# Patient Record
Sex: Male | Born: 1935 | Race: White | Hispanic: No | Marital: Married | Smoking: Never smoker
Health system: Southern US, Community
[De-identification: ages and names within clinical notes are randomized; demographics above are authoritative.]

## PROBLEM LIST (undated history)

## (undated) DIAGNOSIS — I10 Essential (primary) hypertension: Secondary | ICD-10-CM

## (undated) DIAGNOSIS — I639 Cerebral infarction, unspecified: Secondary | ICD-10-CM

## (undated) DIAGNOSIS — E119 Type 2 diabetes mellitus without complications: Secondary | ICD-10-CM

## (undated) DIAGNOSIS — F0390 Unspecified dementia without behavioral disturbance: Secondary | ICD-10-CM

## (undated) HISTORY — PX: EYE SURGERY: SHX253

## (undated) HISTORY — PX: CARDIAC CATHETERIZATION: SHX172

## (undated) HISTORY — DX: Type 2 diabetes mellitus without complications: E11.9

---

## 2003-07-16 ENCOUNTER — Ambulatory Visit (HOSPITAL_COMMUNITY): Admission: RE | Admit: 2003-07-16 | Discharge: 2003-07-17 | Payer: Self-pay | Admitting: Ophthalmology

## 2006-09-02 ENCOUNTER — Encounter: Admission: RE | Admit: 2006-09-02 | Discharge: 2006-09-02 | Payer: Self-pay | Admitting: Internal Medicine

## 2006-12-15 ENCOUNTER — Inpatient Hospital Stay (HOSPITAL_COMMUNITY): Admission: RE | Admit: 2006-12-15 | Discharge: 2006-12-16 | Payer: Self-pay | Admitting: Specialist

## 2007-05-22 ENCOUNTER — Ambulatory Visit: Payer: Self-pay | Admitting: Vascular Surgery

## 2007-06-18 ENCOUNTER — Encounter: Admission: RE | Admit: 2007-06-18 | Discharge: 2007-06-18 | Payer: Self-pay | Admitting: Internal Medicine

## 2007-07-13 ENCOUNTER — Encounter: Admission: RE | Admit: 2007-07-13 | Discharge: 2007-07-14 | Payer: Self-pay | Admitting: Internal Medicine

## 2010-09-29 NOTE — Procedures (Signed)
CAROTID DUPLEX EXAM   INDICATION:  Follow-up evaluation of known carotid artery disease.   HISTORY:  Diabetes:  Diet-controlled.  Cardiac:  No.  Hypertension:  No.  Smoking:  No.  Previous Surgery:  No.  CV History:  Patient had two episodes of dysphasia in November, 2000.  Patient had TIA in March, 2008, characterized by dysphagia.  Previous  study performed June 05, 1999 revealed no ICA stenosis bilaterally.  Amaurosis Fugax No, Paresthesias No, Hemiparesis No                                       RIGHT             LEFT  Brachial systolic pressure:         126               130  Brachial Doppler waveforms:         Triphasic         Triphasic  Vertebral direction of flow:        Antegrade         Antegrade  DUPLEX VELOCITIES (cm/sec)  CCA peak systolic                   69                69  ECA peak systolic                   75                93  ICA peak systolic                   62                69  ICA end diastolic                   16                70  PLAQUE MORPHOLOGY:                  Soft              None  PLAQUE AMOUNT:                      Mild              None  PLAQUE LOCATION:                    Proximal ICA      None   IMPRESSION:  1. 20-39% right internal carotid artery stenosis.  2. No left internal carotid artery stenosis.   ___________________________________________  Janetta Hora Fields, MD   MC/MEDQ  D:  05/22/2007  T:  05/23/2007  Job:  [id number]

## 2010-09-29 NOTE — Op Note (Signed)
NAME:  Clinton [name], Clinton [name]                 ACCOUNT NO.:  [id number]   MEDICAL RECORD NO.:  [id number]          PATIENT TYPE:  INP   LOCATION:  2550                         FACILITY:  [facility]   PHYSICIAN:  Kerrin Champagne, M.D.   DATE OF BIRTH:  1923-07-28   DATE OF PROCEDURE:  12/15/2006  DATE OF DISCHARGE:                               OPERATIVE REPORT   PREOPERATIVE DIAGNOSIS:  Left shoulder full-thickness rotator cuff tear  involving supraspinatus, infraspinatus.  Degenerative joint disease,  severe left acromioclavicular joint.   POSTOPERATIVE DIAGNOSIS:  Left shoulder full-thickness rotator cuff tear  involving supraspinatus, infraspinatus.  Degenerative joint disease,  severe left acromioclavicular joint.   PROCEDURE:  Left shoulder rotator cuff repair using suture to trough  with Mitek anchors and a Grafton jacket.  Acromioplasty left shoulder  and distal clavicle resection.   SURGEON:  Kerrin Champagne, M.D.   ASSISTANT:  Maud Deed, [facility]   ANESTHESIA:  General via oral tracheal intubation, Dr. Michelle Piper.   FINDINGS:  Large rotator cuff tear involving the supraspinatus,  infraspinatus tendons.  Tendons were able to be pulled into a suture and  trough type of repair with two Mitek anchors in a pants over vest type  repair.  Grafton jacket was used to cover any residual small areas of  openings.   SPECIMENS:  None.   ESTIMATED BLOOD LOSS:  75 mL.   COMPLICATIONS:  None.  The patient returned PACU in good condition.   HISTORY OF PRESENT ILLNESS:  The patient is an 77 year old male presents  with a 64-month history of left shoulder weakness associated with sudden  movement of his arm.  He reported slight pop and giving way of his left  arm, difficulty with raising using the arm overhead.  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  After attempts at conservative management and one  or two physical therapy visits, it is apparent that he had severe  rotator cuff weakness.  MRI scan  demonstrated full-thickness rotator  cuff tear involving supraspinatus as well as the infraspinatus tendons  with extensive tendinitis changes.   INTRAOPERATIVE FINDINGS:  The patient was found to have severe  osteoarthritis changes involving the [facility] joint and the distal clavicle  was resected.  Spurs removed.  He then had repair of the rotator cuff  performed with debridement of the frayed edges of the supraspinatus,  infraspinatus, repair the interval between the subscapularis and the  supraspinatus superiorly and anteriorly.  Mitek anchors were used to  additionally repair the supraspinatus to the subscapularis just at the  sulcus between the greater tuberosity and the humeral head cartilage  surface.  The trough was made and supraspinatus and infraspinatus were  pulled into the trough using sutures in the Concept tray.  Acromioplasty  was performed, found to have large spur off the anterior surface  consistent with type II acromion process.   DESCRIPTION OF PROCEDURE:  After adequate general anesthesia the patient  in the black shoulder frame.  All pressure points well-padded.  Standard  prep with DuraPrep solution from the left hand to  left shoulder  circumferentially about the arm axillary area, anterior chest, lower  neck, laterally and posterior scapular.  Draped in the usual manner,  iodine Vi-drape was used.  Infiltration of the skin overlying the  superior aspect of the left AC joint over the anterior one-third  deltoid.  In line with the expected anterior one third raphe.  Incision  over the [facility] joint approximately 1 cm to 2 cm in length skin and  subcutaneous layers directly down over the [facility] joint line with the  clavicle.  Electrocautery used to control bleeders.  Incision made into  the capsule of the [facility] joint and subcutaneous debridement carried out of  large osteophytes over the acromion portion of the [facility] joint.  Cobb  elevator then used to elevate the periosteum  circumferentially about the  distal clavicle and a Senn retractor placed circumferentially about the  distal clavicle.  A oscillating saw then used divide the clavicle 1 cm  from the end.  Smoothing this area shoulder was then adducted and  abducted and showed some persistent impingement anteriorly, so a second  cut was made about three millimeters more centrally or medially.  This  then allowed for decompression of the joint and shoulder adduction  abduction.  The ends of the clavicle then beveled.  And then carefully  irrigated bone wax applied to bleeding cancellous bone surface.  Following irrigation then the capsule was then closed reapproximated  with interrupted zero Vicryl sutures subcu layers with interrupted 2-0  Vicryl suture and skin closed with the subcu stitch of 2-0 Vicryl.  Incision made in the left anterior line with the anterior one third  raphe of the deltoid through skin and subcutaneous layers with 10 blade  scalpel.  Electrocautery used develop a layered interval between the  anterior and middle thirds of the deltoid.  This was easily spread.  Continued up onto the superior surface of the acromion process.  Subperiosteal dissection then carried anterior medial and anterior  lateral exposing the anterior aspect the acromion process and preserving  the periosteal attachment.  Protecting the rotator cuff then with  Copywriter, advertising.  The anterior portion of the  acromion was then osteotomized using oscillating saw removing  approximately 1.5 cm to 2 cm of bone anteriorly, beveling posteriorly.  This was resected using a Theatre manager.  Small bleeder in the  coracoacromial branch was then cauterized medially.  And the  undersurface the acromion process then burred using a pine cone shaped  bur.   The bursa overlying the left shoulder was then immediately identified as  quite thickened and inflamed.  Subdeltoid bursa was resected as was the   subacromial bursa extending posteriorly identifying the torn ends of the  rotator cuff both supraspinatus and infraspinatus retracted posteriorly.  The patient's biceps tendon was identified found to be intact.  All  portion of the frayed attachment of the supraspinatus still present  anteriorly.  The supraspinatus itself was fully torn and retracted, some  small attachments to the subscapularis and subscapularis interval still  remaining.  The infraspinatus tendon had also completely detached  posteriorly.  The degenerated edges and borders of the supraspinatus and  infraspinatus were debrided using sharp dissection and scalpel.  #1  Ethibond sutures then inserted into the ends of the tendon pulling the  superficial and deep torn portions the tendon together with a modified  Kessler type stitch.  A total of three stitches were placed.  Additional  sutures were  then used to repair the interval between the supraspinatus  and subscapularis using interrupted #1 Ethibond sutures.  A three-  quarter inch osteotome then used to make a trough into the border  between the surface of the humeral head and the medial sulcus of the  greater tuberosity.  High-speed bur used to further deepen this.  The  cartilaginous border of this trough was then carefully burred to  bleeding subchondral bone.  Two drill holes were placed and Mitek  anchors with four anchors were then placed.  Next holes were then placed  in the greater tuberosity a total of three for anchoring of the sutures  placed in the distal portions of the supraspinatus, infraspinatus  tendon.  These sutures were then passed using suture passers through the  holes into the trough provided.  The two Mitek anchor sutures were then  passed.  The one more anterior was passed through residual portions of  the subscapularis anteriorly and then through the tendinous portion of  supraspinatus tendon.  This was then tagged.  The second Mitek anchor  was  passed through the portions of the distal supraspinatus tendon  approximately 2 cm proximal to the attachment of the expected attachment  greater tuberosity.  Shoulder was then abducted and the supraspinatus  and infraspinatus tendons were then sutured and tied into place.  This  using the #1 Ethibond sutures and passed through the suture trough  technique.  With this completed then the Mitek anchor sutures were then  tied tightening down the supraspinatus to the bleeding bony surface over  the lateral aspect of the humeral head.  This left a small gap  anteriorly of the supraspinatus and the subscapularis interval.  Grafton  jacket was then reactivated saline for total of 15 minutes.  This was  then cut to the small area of remaining cuff requiring tissue here.  Was  sutured into place using #1 Ethibond sutures running stitch from the  proximal corner extending posteriorly along the edge of the  supraspinatus and then the anterior edge running along the subscapularis  border.  The distal border was then sewn into the greater tuberosity  using #1 Ethibond.  This completed the repair of the patient's rotator  cuff with abduction internal-external rotation, showed no impingement of  the acromion process.  The acromion process itself with bone wax for  preventing further bleeding.  Checking the edges and undersurface there  is no remaining spurs here.  Irrigation was performed and the tendinous  portions of the deltoid then approximated over the acromion process  using #1 Ethibond suture.  The superficial fascial layer of the deltoid  approximated with interrupted zero Vicryl sutures deep subcu layers  approximated with interrupted 2-0 Vicryl sutures and the skin closed  with stainless steel staples.  Adaptic, 4x4s, ABD pad fixed to the skin  with Hypafix tape, ABD pad was placed in the axillary area.  The patient  placed in the shoulder abduction pillow brace.  He was then reactivated,   extubated, returned recovery room in satisfactory condition.  Note that  he had received preoperatively anterior scalene block.      Kerrin Champagne, M.D.  Electronically Signed     JEN/MEDQ  D:  12/15/2006  T:  12/16/2006  Job:  [id number]

## 2011-01-13 ENCOUNTER — Encounter (INDEPENDENT_AMBULATORY_CARE_PROVIDER_SITE_OTHER): Payer: PRIVATE HEALTH INSURANCE | Admitting: Ophthalmology

## 2011-01-13 DIAGNOSIS — H3530 Unspecified macular degeneration: Secondary | ICD-10-CM

## 2011-01-13 DIAGNOSIS — E11319 Type 2 diabetes mellitus with unspecified diabetic retinopathy without macular edema: Secondary | ICD-10-CM

## 2011-01-13 DIAGNOSIS — H43819 Vitreous degeneration, unspecified eye: Secondary | ICD-10-CM

## 2011-01-13 DIAGNOSIS — E11359 Type 2 diabetes mellitus with proliferative diabetic retinopathy without macular edema: Secondary | ICD-10-CM

## 2011-01-22 ENCOUNTER — Other Ambulatory Visit (INDEPENDENT_AMBULATORY_CARE_PROVIDER_SITE_OTHER): Payer: Self-pay | Admitting: Ophthalmology

## 2011-01-22 ENCOUNTER — Encounter (HOSPITAL_COMMUNITY)
Admission: RE | Admit: 2011-01-22 | Discharge: 2011-01-22 | Disposition: A | Discharge status: Home / Self Care | Payer: PRIVATE HEALTH INSURANCE | Source: Ambulatory Visit | Attending: Ophthalmology | Admitting: Ophthalmology

## 2011-01-22 ENCOUNTER — Ambulatory Visit (HOSPITAL_COMMUNITY)
Admission: RE | Admit: 2011-01-22 | Discharge: 2011-01-22 | Disposition: A | Discharge status: Home / Self Care | Payer: PRIVATE HEALTH INSURANCE | Source: Ambulatory Visit | Attending: Ophthalmology | Admitting: Ophthalmology

## 2011-01-22 DIAGNOSIS — H2702 Aphakia, left eye: Secondary | ICD-10-CM

## 2011-01-22 DIAGNOSIS — Z01812 Encounter for preprocedural laboratory examination: Secondary | ICD-10-CM | POA: Insufficient documentation

## 2011-01-22 DIAGNOSIS — H2700 Aphakia, unspecified eye: Secondary | ICD-10-CM | POA: Insufficient documentation

## 2011-01-22 DIAGNOSIS — Z01810 Encounter for preprocedural cardiovascular examination: Secondary | ICD-10-CM | POA: Insufficient documentation

## 2011-01-22 DIAGNOSIS — Z01818 Encounter for other preprocedural examination: Secondary | ICD-10-CM | POA: Insufficient documentation

## 2011-01-22 DIAGNOSIS — M47814 Spondylosis without myelopathy or radiculopathy, thoracic region: Secondary | ICD-10-CM | POA: Insufficient documentation

## 2011-01-22 LAB — CBC
MCH: 30.8 pg (ref 26.0–34.0)
MCV: 90.9 fL (ref 78.0–100.0)
RBC: 4.29 MIL/uL (ref 4.22–5.81)
WBC: 6.5 10*3/uL (ref 4.0–10.5)

## 2011-01-22 LAB — BASIC METABOLIC PANEL
BUN: 23 mg/dL (ref 6–23)
CO2: 31 mEq/L (ref 19–32)
GFR calc Af Amer: >60 mL/min (ref >60)

## 2011-01-22 LAB — SURGICAL PCR SCREEN: MRSA, PCR: NEGATIVE

## 2011-01-28 ENCOUNTER — Ambulatory Visit (HOSPITAL_COMMUNITY)
Admission: RE | Admit: 2011-01-28 | Discharge: 2011-01-28 | Disposition: A | Discharge status: Home / Self Care | Payer: PRIVATE HEALTH INSURANCE | Source: Ambulatory Visit | Attending: Ophthalmology | Admitting: Ophthalmology

## 2011-01-28 DIAGNOSIS — H2700 Aphakia, unspecified eye: Secondary | ICD-10-CM

## 2011-01-28 DIAGNOSIS — Z01818 Encounter for other preprocedural examination: Secondary | ICD-10-CM | POA: Insufficient documentation

## 2011-01-28 DIAGNOSIS — Z01812 Encounter for preprocedural laboratory examination: Secondary | ICD-10-CM | POA: Insufficient documentation

## 2011-01-28 DIAGNOSIS — H43399 Other vitreous opacities, unspecified eye: Secondary | ICD-10-CM | POA: Insufficient documentation

## 2011-01-28 DIAGNOSIS — Z01810 Encounter for preprocedural cardiovascular examination: Secondary | ICD-10-CM | POA: Insufficient documentation

## 2011-01-28 DIAGNOSIS — K219 Gastro-esophageal reflux disease without esophagitis: Secondary | ICD-10-CM | POA: Insufficient documentation

## 2011-01-28 DIAGNOSIS — E1139 Type 2 diabetes mellitus with other diabetic ophthalmic complication: Secondary | ICD-10-CM | POA: Insufficient documentation

## 2011-01-28 DIAGNOSIS — Z8673 Personal history of transient ischemic attack (TIA), and cerebral infarction without residual deficits: Secondary | ICD-10-CM | POA: Insufficient documentation

## 2011-01-28 DIAGNOSIS — E11359 Type 2 diabetes mellitus with proliferative diabetic retinopathy without macular edema: Secondary | ICD-10-CM

## 2011-01-28 LAB — GLUCOSE, CAPILLARY
Glucose-Capillary: 84 mg/dL (ref 70–99)
Glucose-Capillary: 111 mg/dL — ABNORMAL HIGH (ref 70–99)

## 2011-01-29 ENCOUNTER — Inpatient Hospital Stay (INDEPENDENT_AMBULATORY_CARE_PROVIDER_SITE_OTHER): Payer: PRIVATE HEALTH INSURANCE | Admitting: Ophthalmology

## 2011-01-29 DIAGNOSIS — H2700 Aphakia, unspecified eye: Secondary | ICD-10-CM

## 2011-02-02 NOTE — Op Note (Signed)
NAME:  Clinton [name], Clinton [name]                 ACCOUNT NO.:  [id number]  MEDICAL RECORD NO.:  [id number]  LOCATION:  SDSC                         FACILITY:  [facility]  PHYSICIAN:  Beulah Gandy. [name], M.D. DATE OF BIRTH:  08-27-1923  DATE OF PROCEDURE:  01/28/2011 DATE OF DISCHARGE:                              OPERATIVE REPORT   ADMISSION DIAGNOSES:  Proliferative diabetic retinopathy, vitreous opacities, and aphakia of left eye.  PROCEDURES:  Retinal photocoagulation, pars plana vitrectomy, placement of secondary intraocular lens with suture and gas-fluid exchange, all left eye.  SURGEON:  Beulah Gandy. [name], MD  ASSISTANT:  Rosalie Doctor, SA  ANESTHESIA:  General.  DETAILS:  Usual prep and drape.  The indirect ophthalmoscope laser was moved into place, 335 burns were placed around the retinal periphery with a power of 300 milliwatts in a panretinal pattern, size was 1000 milliwatts each and 0.1 seconds each.  Attention was then carried to the limbal area where conjunctival peritomy was performed from 8 o'clock around to 4 o'clock.  Half-thickness scleral flaps were raised at 3 and 9 o'clock in anticipation of IOL suture.  A 3-layered carefully constructed corneoscleral wound was created 7.5 mm in length between 10:30 and 1:30 o'clock.  The wound was begun 2 mm back on the sclera. 25 gauge trocars were placed at 10, 2, and 4 o'clock with infusion at 4 o'clock.  The lighted pick and the cutter were placed at 10 and 2 o'clock respectively.  Pars plana vitrectomy was begun with removal of some vitreous contents and vitreous blood.  Due to previous vitrectomy, there was not a large amount of work to begun in the vitreous cavity. The instruments were removed from the eye and scleral plugs were placed. Two Prolene sutures were passed beneath the scleral flaps posterior to the iris and in the ciliary sulcus from 3 o'clock to 9 o'clock.  The sutures were externalized through the wound and intraocular  lens was brought onto the field made by [employer] model [id number], power 18.0D, length 12.5 mm, optic 7.0 mm, serial number [id number], expiration date 02/2011.  The lens was inspected and cleaned.  The Prolene sutures were attached to the eyelets of the lens.  The lens was passed into the anterior chamber and then into the ciliary sulcus. Sinskey hook was used to manipulate the lens into place.  The lens was dialed into place.  Prolene sutures were drawn securely, knotted, and the free ends were removed.  The scleral flaps were allowed to lie over the Prolene knots.  The attention was carried to the corneoscleral wound.  Five interrupted 10-0 nylon sutures were placed in a radial fashion and inverted.  The wound was tested and found to be tight. Additional vitrectomy was carried out at this point removing pigment and blood from the vitreous cavity and the retinal surface.  Once this was accomplished, a 30% gas-fluid exchange was performed.  The instruments were removed from the eye and the 25-gauge trocars were removed from the eye.  The wounds were tested and found to be tight.  Conjunctiva was reposited with 7-0 chromic suture.  Polymyxin and gentamicin were irrigated into [place] space.  Marcaine was injected around the globe for postop pain.  Closing pressure was less than 10 with a Barraquer tonometer. Complications were none.  Duration was 1-1/2 hours.  Polysporin ophthalmic ointment, patch and shield were placed.  Decadron 10 mg was injected into the lower subconjunctival space.  Patch and shield were placed.  The patient was awakened and taken to recovery in satisfactory condition.     Beulah Gandy. [name], M.D.     JDM/MEDQ  D:  01/28/2011  T:  01/28/2011  Job:  [id number]  Electronically Signed by Alan Mulder M.D. on 02/02/2011 01:48:40 PM

## 2011-02-04 ENCOUNTER — Inpatient Hospital Stay (INDEPENDENT_AMBULATORY_CARE_PROVIDER_SITE_OTHER): Payer: PRIVATE HEALTH INSURANCE | Admitting: Ophthalmology

## 2011-02-04 DIAGNOSIS — H2700 Aphakia, unspecified eye: Secondary | ICD-10-CM

## 2011-02-18 ENCOUNTER — Encounter (INDEPENDENT_AMBULATORY_CARE_PROVIDER_SITE_OTHER): Payer: PRIVATE HEALTH INSURANCE | Admitting: Ophthalmology

## 2011-02-18 DIAGNOSIS — H2700 Aphakia, unspecified eye: Secondary | ICD-10-CM

## 2011-03-01 LAB — TYPE AND SCREEN
ABO/RH(D): O POS
Antibody Screen: NEGATIVE

## 2011-03-01 LAB — CBC
Hemoglobin: 13.6
MCHC: 33.8
Platelets: 186
WBC: 7.5

## 2011-03-01 LAB — BASIC METABOLIC PANEL
BUN: 22
CO2: 30
Chloride: 105

## 2011-04-29 ENCOUNTER — Encounter (INDEPENDENT_AMBULATORY_CARE_PROVIDER_SITE_OTHER): Payer: PRIVATE HEALTH INSURANCE | Admitting: Ophthalmology

## 2011-04-29 DIAGNOSIS — E11359 Type 2 diabetes mellitus with proliferative diabetic retinopathy without macular edema: Secondary | ICD-10-CM

## 2011-04-29 DIAGNOSIS — H43819 Vitreous degeneration, unspecified eye: Secondary | ICD-10-CM

## 2011-04-29 DIAGNOSIS — H3530 Unspecified macular degeneration: Secondary | ICD-10-CM

## 2011-04-29 DIAGNOSIS — E11319 Type 2 diabetes mellitus with unspecified diabetic retinopathy without macular edema: Secondary | ICD-10-CM

## 2011-04-29 DIAGNOSIS — E1139 Type 2 diabetes mellitus with other diabetic ophthalmic complication: Secondary | ICD-10-CM

## 2012-05-01 ENCOUNTER — Ambulatory Visit (INDEPENDENT_AMBULATORY_CARE_PROVIDER_SITE_OTHER): Payer: PRIVATE HEALTH INSURANCE | Admitting: Ophthalmology

## 2012-05-01 DIAGNOSIS — E11319 Type 2 diabetes mellitus with unspecified diabetic retinopathy without macular edema: Secondary | ICD-10-CM

## 2012-05-01 DIAGNOSIS — H353190 Nonexudative age-related macular degeneration, unspecified eye, stage unspecified: Secondary | ICD-10-CM

## 2012-05-01 DIAGNOSIS — E1165 Type 2 diabetes mellitus with hyperglycemia: Secondary | ICD-10-CM

## 2012-05-01 DIAGNOSIS — E1139 Type 2 diabetes mellitus with other diabetic ophthalmic complication: Secondary | ICD-10-CM

## 2012-05-01 DIAGNOSIS — E11359 Type 2 diabetes mellitus with proliferative diabetic retinopathy without macular edema: Secondary | ICD-10-CM

## 2013-05-02 ENCOUNTER — Ambulatory Visit (INDEPENDENT_AMBULATORY_CARE_PROVIDER_SITE_OTHER): Payer: Medicare Other | Admitting: Ophthalmology

## 2013-05-02 DIAGNOSIS — H3530 Unspecified macular degeneration: Secondary | ICD-10-CM

## 2013-05-02 DIAGNOSIS — E1139 Type 2 diabetes mellitus with other diabetic ophthalmic complication: Secondary | ICD-10-CM

## 2013-05-02 DIAGNOSIS — E11319 Type 2 diabetes mellitus with unspecified diabetic retinopathy without macular edema: Secondary | ICD-10-CM

## 2013-05-02 DIAGNOSIS — H43819 Vitreous degeneration, unspecified eye: Secondary | ICD-10-CM

## 2013-05-02 DIAGNOSIS — E11359 Type 2 diabetes mellitus with proliferative diabetic retinopathy without macular edema: Secondary | ICD-10-CM

## 2013-06-20 ENCOUNTER — Ambulatory Visit (INDEPENDENT_AMBULATORY_CARE_PROVIDER_SITE_OTHER): Payer: Medicare Other | Admitting: Podiatrist

## 2013-06-20 ENCOUNTER — Encounter: Payer: Self-pay | Admitting: Podiatrist

## 2013-06-20 VITALS — BP 135/70 | HR 94 | Resp 16

## 2013-06-20 DIAGNOSIS — M79609 Pain in unspecified limb: Secondary | ICD-10-CM

## 2013-06-20 DIAGNOSIS — B351 Tinea unguium: Secondary | ICD-10-CM

## 2013-06-20 NOTE — Patient Instructions (Signed)
Diabetes and Foot Care Diabetes may cause you to have problems because of poor blood supply (circulation) to your feet and legs. This may cause the skin on your feet to become thinner, break easier, and heal more slowly. Your skin may become dry, and the skin may peel and crack. You may also have nerve damage in your legs and feet causing decreased feeling in them. You may not notice minor injuries to your feet that could lead to infections or more serious problems. Taking care of your feet is one of the most important things you can do for yourself.  HOME CARE INSTRUCTIONS  Wear shoes at all times, even in the house. Do not go barefoot. Bare feet are easily injured.  Check your feet daily for blisters, cuts, and redness. If you cannot see the bottom of your feet, use a mirror or ask someone for help.  Wash your feet with warm water (do not use hot water) and mild soap. Then pat your feet and the areas between your toes until they are completely dry. Do not soak your feet as this can dry your skin.  Apply a moisturizing lotion or petroleum jelly (that does not contain alcohol and is unscented) to the skin on your feet and to dry, brittle toenails. Do not apply lotion between your toes.  Trim your toenails straight across. Do not dig under them or around the cuticle. File the edges of your nails with an emery board or nail file.  Do not cut corns or calluses or try to remove them with medicine.  Wear clean socks or stockings every day. Make sure they are not too tight. Do not wear knee-high stockings since they may decrease blood flow to your legs.  Wear shoes that fit properly and have enough cushioning. To break in new shoes, wear them for just a few hours a day. This prevents you from injuring your feet. Always look in your shoes before you put them on to be sure there are no objects inside.  Do not cross your legs. This may decrease the blood flow to your feet.  If you find a minor scrape,  cut, or break in the skin on your feet, keep it and the skin around it clean and dry. These areas may be cleansed with mild soap and water. Do not cleanse the area with peroxide, alcohol, or iodine.  When you remove an adhesive bandage, be sure not to damage the skin around it.  If you have a wound, look at it several times a day to make sure it is healing.  Do not use heating pads or hot water bottles. They may burn your skin. If you have lost feeling in your feet or legs, you may not know it is happening until it is too late.  Make sure your health care provider performs a complete foot exam at least annually or more often if you have foot problems. Report any cuts, sores, or bruises to your health care provider immediately. SEEK MEDICAL CARE IF:   You have an injury that is not healing.  You have cuts or breaks in the skin.  You have an ingrown nail.  You notice redness on your legs or feet.  You feel burning or tingling in your legs or feet.  You have pain or cramps in your legs and feet.  Your legs or feet are numb.  Your feet always feel cold. SEEK IMMEDIATE MEDICAL CARE IF:   There is increasing redness,   swelling, or pain in or around a wound.  There is a red line that goes up your leg.  Pus is coming from a wound.  You develop a fever or as directed by your health care provider.  You notice a bad smell coming from an ulcer or wound. Document Released: 04/30/2000 Document Revised: 01/03/2013 Document Reviewed: 10/10/2012 Exit[employer] Patient Information 2014 [employer].  

## 2013-06-22 NOTE — Progress Notes (Signed)
HPI:  Patient presents today for follow up of foot and nail care. Denies any new complaints today.  Objective:  Patients chart is reviewed.  Vascular status reveals pedal pulses noted at 2 out of 4 dp and pt bilateral .  Neurological sensation is Normal to Semmes Weinstein monofilament bilateral.  Patients nails are thickened, discolored, distrophic, friable and brittle with yellow-Bunning discoloration. Patient subjectively relates they are painful with shoes and with ambulation of bilateral feet.  Assessment:  Symptomatic onychomycosis  Plan:  Discussed treatment options and alternatives.  The symptomatic toenails were debrided through manual an mechanical means without complication.  Return appointment recommended at routine intervals of 3 months    Berenise Hunton, DPM   

## 2013-10-15 ENCOUNTER — Encounter (HOSPITAL_COMMUNITY): Payer: Self-pay | Admitting: Emergency Medicine

## 2013-10-15 ENCOUNTER — Emergency Department (HOSPITAL_COMMUNITY): Payer: Medicare Other

## 2013-10-15 ENCOUNTER — Inpatient Hospital Stay (HOSPITAL_COMMUNITY)
Admission: EM | Admit: 2013-10-15 | Discharge: 2013-10-17 | DRG: 066 | Disposition: A | Discharge status: Home Health | Payer: Medicare Other | Attending: Internal Medicine | Admitting: Internal Medicine

## 2013-10-15 ENCOUNTER — Ambulatory Visit (INDEPENDENT_AMBULATORY_CARE_PROVIDER_SITE_OTHER): Payer: Medicare Other | Admitting: Internal Medicine

## 2013-10-15 VITALS — BP 120/50 | HR 105 | Temp 99.1°F | Resp 16

## 2013-10-15 DIAGNOSIS — I6330 Cerebral infarction due to thrombosis of unspecified cerebral artery: Principal | ICD-10-CM | POA: Diagnosis present

## 2013-10-15 DIAGNOSIS — I69922 Dysarthria following unspecified cerebrovascular disease: Secondary | ICD-10-CM

## 2013-10-15 DIAGNOSIS — I639 Cerebral infarction, unspecified: Secondary | ICD-10-CM | POA: Diagnosis present

## 2013-10-15 DIAGNOSIS — E118 Type 2 diabetes mellitus with unspecified complications: Secondary | ICD-10-CM

## 2013-10-15 DIAGNOSIS — E1165 Type 2 diabetes mellitus with hyperglycemia: Secondary | ICD-10-CM

## 2013-10-15 DIAGNOSIS — IMO0002 Reserved for concepts with insufficient information to code with codable children: Secondary | ICD-10-CM

## 2013-10-15 DIAGNOSIS — I10 Essential (primary) hypertension: Secondary | ICD-10-CM | POA: Diagnosis present

## 2013-10-15 DIAGNOSIS — I6350 Cerebral infarction due to unspecified occlusion or stenosis of unspecified cerebral artery: Secondary | ICD-10-CM

## 2013-10-15 DIAGNOSIS — E119 Type 2 diabetes mellitus without complications: Secondary | ICD-10-CM

## 2013-10-15 DIAGNOSIS — Z7982 Long term (current) use of aspirin: Secondary | ICD-10-CM

## 2013-10-15 DIAGNOSIS — D72829 Elevated white blood cell count, unspecified: Secondary | ICD-10-CM | POA: Diagnosis present

## 2013-10-15 HISTORY — DX: Cerebral infarction, unspecified: I63.9

## 2013-10-15 HISTORY — DX: Essential (primary) hypertension: I10

## 2013-10-15 LAB — URINE MICROSCOPIC-ADD ON

## 2013-10-15 LAB — COMPREHENSIVE METABOLIC PANEL
ALBUMIN: 3.5 g/dL (ref 3.5–5.2)
ALT: 15 U/L (ref 0–53)
AST: 24 U/L (ref 0–37)
Alkaline Phosphatase: 67 U/L (ref 39–117)
BUN: 26 mg/dL — ABNORMAL HIGH (ref 6–23)
CALCIUM: 9.6 mg/dL (ref 8.4–10.5)
CO2: 25 mEq/L (ref 19–32)
CREATININE: 0.84 mg/dL (ref 0.50–1.35)
Chloride: 103 mEq/L (ref 96–112)
GFR calc Af Amer: 89 mL/min — ABNORMAL LOW (ref >90)
GFR calc non Af Amer: 76 mL/min — ABNORMAL LOW (ref >90)
Glucose, Bld: 215 mg/dL — ABNORMAL HIGH (ref 70–99)
POTASSIUM: 4.4 meq/L (ref 3.7–5.3)
Sodium: 140 mEq/L (ref 137–147)
Total Bilirubin: 0.6 mg/dL (ref 0.3–1.2)
Total Protein: 6.4 g/dL (ref 6.0–8.3)

## 2013-10-15 LAB — PROTIME-INR
INR: 1 (ref 0.00–1.49)
Prothrombin Time: 13 seconds (ref 11.6–15.2)

## 2013-10-15 LAB — GLUCOSE, POCT (MANUAL RESULT ENTRY): POC Glucose: 235 mg/dl — AB (ref 70–99)

## 2013-10-15 LAB — I-STAT CHEM 8, ED
BUN: 26 mg/dL — ABNORMAL HIGH (ref 6–23)
CREATININE: 0.9 mg/dL (ref 0.50–1.35)
Calcium, Ion: 1.31 mmol/L — ABNORMAL HIGH (ref 1.13–1.30)
Chloride: 99 mEq/L (ref 96–112)
GLUCOSE: 212 mg/dL — AB (ref 70–99)
HCT: 41 % (ref 39.0–52.0)
HEMOGLOBIN: 13.9 g/dL (ref 13.0–17.0)
Potassium: 4.2 mEq/L (ref 3.7–5.3)
SODIUM: 142 meq/L (ref 137–147)
TCO2: 25 mmol/L (ref 0–100)

## 2013-10-15 LAB — URINALYSIS, ROUTINE W REFLEX MICROSCOPIC
BILIRUBIN URINE: NEGATIVE
Glucose, UA: 500 mg/dL — AB
Ketones, ur: 15 mg/dL — AB
Leukocytes, UA: NEGATIVE
Nitrite: NEGATIVE
PROTEIN: NEGATIVE mg/dL
Specific Gravity, Urine: 1.024 (ref 1.005–1.030)
Urobilinogen, UA: 0.2 mg/dL (ref 0.0–1.0)
pH: 6 (ref 5.0–8.0)

## 2013-10-15 LAB — CBG MONITORING, ED: Glucose-Capillary: 189 mg/dL — ABNORMAL HIGH (ref 70–99)

## 2013-10-15 LAB — CBC
HCT: 37.7 % — ABNORMAL LOW (ref 39.0–52.0)
HEMOGLOBIN: 12.7 g/dL — AB (ref 13.0–17.0)
MCH: 31.4 pg (ref 26.0–34.0)
MCHC: 33.7 g/dL (ref 30.0–36.0)
MCV: 93.3 fL (ref 78.0–100.0)
Platelets: 147 10*3/uL — ABNORMAL LOW (ref 150–400)
RBC: 4.04 MIL/uL — ABNORMAL LOW (ref 4.22–5.81)
RDW: 12.9 % (ref 11.5–15.5)
WBC: 14.2 10*3/uL — AB (ref 4.0–10.5)

## 2013-10-15 LAB — DIFFERENTIAL
Basophils Absolute: 0 10*3/uL (ref 0.0–0.1)
Basophils Relative: 0 % (ref 0–1)
EOS ABS: 0 10*3/uL (ref 0.0–0.7)
Eosinophils Relative: 0 % (ref 0–5)
Lymphocytes Relative: 7 % — ABNORMAL LOW (ref 12–46)
Lymphs Abs: 1.1 10*3/uL (ref 0.7–4.0)
Monocytes Absolute: 0.7 10*3/uL (ref 0.1–1.0)
Monocytes Relative: 5 % (ref 3–12)
NEUTROS PCT: 88 % — AB (ref 43–77)
Neutro Abs: 12.5 10*3/uL — ABNORMAL HIGH (ref 1.7–7.7)

## 2013-10-15 LAB — I-STAT TROPONIN, ED: Troponin i, poc: 0.01 ng/mL (ref 0.00–0.08)

## 2013-10-15 LAB — ETHANOL: Alcohol, Ethyl (B): <11 mg/dL (ref 0–11)

## 2013-10-15 LAB — RAPID URINE DRUG SCREEN, HOSP PERFORMED
AMPHETAMINES: NOT DETECTED
BENZODIAZEPINES: NOT DETECTED
Barbiturates: NOT DETECTED
COCAINE: NOT DETECTED
OPIATES: NOT DETECTED
TETRAHYDROCANNABINOL: NOT DETECTED

## 2013-10-15 LAB — APTT: aPTT: 30 seconds (ref 24–37)

## 2013-10-15 MED ORDER — ASPIRIN 300 MG RE SUPP
300.0000 mg | Freq: Every day | RECTAL | Status: DC
  Administered 2013-10-15 – 2013-10-16 (×2): 300 mg via RECTAL
  Filled 2013-10-15 (×3): qty 1

## 2013-10-15 MED ORDER — ASPIRIN 300 MG RE SUPP: 300.0000 mg | Freq: Every day | RECTAL | Status: DC

## 2013-10-15 MED ORDER — SENNOSIDES-DOCUSATE SODIUM 8.6-50 MG PO TABS: 1.0000 | ORAL_TABLET | Freq: Every evening | ORAL | Status: DC | PRN

## 2013-10-15 MED ORDER — ENOXAPARIN SODIUM 30 MG/0.3ML ~~LOC~~ SOLN
30.0000 mg | SUBCUTANEOUS | Status: DC
  Administered 2013-10-15 – 2013-10-16 (×2): 30 mg via SUBCUTANEOUS
  Filled 2013-10-15 (×3): qty 0.3

## 2013-10-15 MED ORDER — CLOPIDOGREL BISULFATE 75 MG PO TABS
75.0000 mg | ORAL_TABLET | Freq: Every day | ORAL | Status: DC
  Administered 2013-10-16: 75 mg via ORAL
  Filled 2013-10-15: qty 1

## 2013-10-15 MED ORDER — ASPIRIN 81 MG PO TABS: 81.0000 mg | ORAL_TABLET | Freq: Every day | ORAL | Status: DC

## 2013-10-15 MED ORDER — ASPIRIN 81 MG PO CHEW: 81.0000 mg | CHEWABLE_TABLET | Freq: Every day | ORAL | Status: DC

## 2013-10-15 NOTE — ED Provider Notes (Signed)
CSN: [id number]     Arrival date & time 10/15/13  1744 History   First MD Initiated Contact with Patient 10/15/13 1750     Chief Complaint  Patient presents with  . Code Stroke     Patient is a 80 y.o. male with PMH of DM who presents with complaints of changes in speech and dysphagia.  Symptoms began appx 8 hours PTA.  Other symptoms include mild weakness to LUE and LLE.  Patient seen at [facility] prior to arrival and found to have left sided facial droop.  Patient sent to ED as a code stroke.     (Consider location/radiation/quality/duration/timing/severity/associated sxs/prior Treatment) Patient is a 80 y.o. male presenting with general illness. The history is provided by the patient and medical records. No language interpreter was used.  Illness Severity:  Unable to specify Onset quality:  Gradual Progression:  Waxing and waning Chronicity:  New Associated symptoms: no abdominal pain, no chest pain, no diarrhea, no ear pain, no headaches, no loss of consciousness, no shortness of breath, no vomiting and no wheezing     Past Medical History  Diagnosis Date  . Diabetes mellitus without complication    Past Surgical History  Procedure Laterality Date  . Eye surgery     No family history on file. History  Substance Use Topics  . Smoking status: Never Smoker   . Smokeless tobacco: Not on file  . Alcohol Use: Not on file    Review of Systems  HENT: Negative for ear pain.   Respiratory: Negative for shortness of breath and wheezing.   Cardiovascular: Negative for chest pain.  Gastrointestinal: Negative for vomiting, abdominal pain and diarrhea.  Neurological: Negative for loss of consciousness and headaches.  All other systems reviewed and are negative.     Allergies  Review of patient's allergies indicates no known allergies.  Home Medications   Prior to Admission medications   Medication Sig Start Date End Date Taking? Authorizing Provider  aspirin 81 MG tablet Take 81  mg by mouth daily.    Historical Provider, MD  clopidogrel (PLAVIX) 75 MG tablet Take 75 mg by mouth daily with breakfast.    Historical Provider, MD  metFORMIN (GLUCOPHAGE-XR) 500 MG 24 hr tablet Take 500 mg by mouth 2 (two) times daily.    Historical Provider, MD   There were no vitals taken for this visit. Physical Exam  Nursing note and vitals reviewed. Constitutional: He is oriented to person, place, and time. He appears well-developed and well-nourished.  HENT:  Head: Normocephalic and atraumatic.  Right Ear: External ear normal.  Left Ear: External ear normal.  Eyes: Conjunctivae are normal. Pupils are equal, round, and reactive to light.  Neck: Normal range of motion. Neck supple.  Cardiovascular: Normal rate and regular rhythm.   Pulmonary/Chest: Effort normal and breath sounds normal. No respiratory distress.  Abdominal: Soft. Bowel sounds are normal. He exhibits no distension. There is no tenderness. There is no rebound.  Musculoskeletal: Normal range of motion. He exhibits no edema and no tenderness.  Neurological: He is alert and oriented to person, place, and time. He has normal strength and normal reflexes. No cranial nerve deficit or sensory deficit. Coordination (mild difficulty with heel to shin.) abnormal. GCS eye subscore is 4. GCS verbal subscore is 5. GCS motor subscore is 6.  Mile asymmetry noted to left lower side of face made more evident with smile.    Mild dysarthria.  Stroke scale of 4.    Skin:  Skin is warm and dry.  Psychiatric: He has a normal mood and affect.    ED Course  Procedures (including critical care time) Labs Review Labs Reviewed  I-STAT CHEM 8, ED - Abnormal; Notable for the following:    BUN 26 (*)    Glucose, Bld 212 (*)    Calcium, Ion 1.31 (*)    All other components within normal limits  CBG MONITORING, ED - Abnormal; Notable for the following:    Glucose-Capillary 189 (*)    All other components within normal limits  ETHANOL   PROTIME-INR  APTT  CBC  DIFFERENTIAL  COMPREHENSIVE METABOLIC PANEL  URINE RAPID DRUG SCREEN (HOSP PERFORMED)  URINALYSIS, ROUTINE W REFLEX MICROSCOPIC  I-STAT TROPOININ, ED  I-STAT TROPOININ, ED    Imaging Review Ct Head Wo Contrast  10/15/2013   CLINICAL DATA:  Code stroke. Difficult with speech, confusion, and possible left sided weakness.  EXAM: CT HEAD WITHOUT CONTRAST  TECHNIQUE: Contiguous axial images were obtained from the base of the skull through the vertex without contrast.  COMPARISON:  MR brain 06/18/2007.  FINDINGS: Generalized atrophy. Moderate to severe microvascular ischemic change. No evidence for acute infarction, hemorrhage, mass lesion, hydrocephalus, or extra-axial fluid. Calvarium intact. Vascular calcification. No sinus or mastoid disease.  IMPRESSION: No acute intracranial abnormality. Atrophy and small vessel disease.  Critical Value/emergent results were called by telephone at the time of interpretation on 10/15/2013 at 6:05 PM to Dr. Joseph Berkshire , who verbally acknowledged these results.   Electronically Signed   By: Rolla Flatten M.D.   On: 10/15/2013 18:05     EKG Interpretation   Date/Time:  Monday October 15 2013 17:59:58 EDT Ventricular Rate:  87 PR Interval:  167 QRS Duration: 139 QT Interval:  404 QTC Calculation: 486 R Axis:   -77 Text Interpretation:  Sinus rhythm RBBB and LAFB Non-specific ST-t changes  No significant change since last tracing Confirmed by POLLINA  MD,  Frio [phone]) on 10/15/2013 6:07:55 PM      MDM   Final diagnoses:  CVA (cerebral infarction)    Patient presents emergency department as a code stroke. Last reported normal 8 hours prior to arrival. Code stroke protocol followed. Patient was noted to be protecting his own airway and thus airway intervention not thought to be needed. CT scan showed no evidence of acute intracranial abnormality. NIH stroke scale of 4 for dysarthria, left-sided facial droop, and  difficulty with heel to shin. Neurology recommendations include admission for risk stratification to medicine service. Labs, CXR, and urine studies were remarkable only for leukocytosis.  Hospitalist consulted for admission.      Corlis Leak, MD 10/15/13 [phone]

## 2013-10-15 NOTE — ED Notes (Signed)
Stroke team at bedside

## 2013-10-15 NOTE — Patient Instructions (Signed)
Pt taken emergently to the er by ems

## 2013-10-15 NOTE — ED Provider Notes (Signed)
Medical screening exam:  Patient brought in as a code stroke. Patient had onset of difficulty with speech, difficulty swallowing, some confusion and possibly left-sided weakness around 10 AM. She initially presented to urgent care and was sent to the ER by EMS. Upon arrival to the ER he is awake alert and oriented. He does appear to have some difficulty with speech intermittently and possibly blunting of the left nasolabial fold. Screening exam completed, patient stable for CT head and will be brought back to the ER for formal examination.  Orpah Greek, MD 10/15/13 1750

## 2013-10-15 NOTE — Code Documentation (Signed)
Patient with wife this morning when we had worsening slurred speech and difficulty swallowing.  LKW at 1000am, per wife his speech became slurred while he was talking to her.  He went to an urgent care this afternoon, EMS called a code stroke when they responded to the facility.  Head CT done.  NIHSS 4,   Mild facial droop, mild right leg ataxia, dysarthria.  Dr Nicole Kindred at bedside to assess patient.  Plan stroke work up.

## 2013-10-15 NOTE — H&P (Signed)
Hospitalist Admission History and Physical  Patient name: Clinton [name] Medical record number: [id number] Date of birth: Jun 20, 1923 Age: 80 y.o. Gender: male  Primary Care Provider: Horton Finer, MD  Chief Complaint: CVA  History of Present Illness:This is a 80 y.o. year old male with noted prior hx/o CVA with dysarthria deficit, DM, HTN presenting with CVA. Pt states that he started having difficulty swallowing earlier in the day. Has baseline difficulty with swallowing from previous stroke. Became acutely worse. Pt's wife took him to [facility]. There R sided facial droop was noted. Pt was then directed to ER as code stroke. On arrival, code stroke protocol activated. Initial head CT negative for any acute abnormalities. Initial NIH stroke scale of 4. Neurology consulted with working dx of likely recurrent acute subcortical cerebral infarction. Pt on babay ASA and plavix prior to evaluation.  Notable labs: WBC 14.2, Hgb 12.7, Cr 0.84, Glu 215. CXR and UA pending.   Assessment and Plan: Clinton [name] is a 80 y.o. year old male presenting with CVA Active Problems:   Diabetes mellitus   CVA (cerebral infarction)    CVA: Will proceed down stroke protocol including MRI/MRA, carotid dopplers, 2D ECHO, risk stratification labs. Neuro consulted. Appreciate recs. Continue baby ASA and plavix. Continue to follow.   Leukocytosis: ? Reactive in setting of CVA. CXR and UA pending. Hold on abx pending results. Trend WBC.   DM: A1C. SSI  FEN/GI: NPO pending bedside swallow eval.  Prophylaxis: lovenox Disposition: pending furhter evaluation  Code Status:Full Code.    Patient Active Problem List   Diagnosis Date Noted  . Diabetes mellitus 10/15/2013  . CVA (cerebral infarction) 10/15/2013   Past Medical History: Past Medical History  Diagnosis Date  . Diabetes mellitus without complication   . Stroke     Past Surgical History: Past Surgical History  Procedure  Laterality Date  . Eye surgery      Social History: History   Social History  . Marital Status: Married    Spouse Name: N/A    Number of Children: N/A  . Years of Education: N/A   Social History Main Topics  . Smoking status: Never Smoker   . Smokeless tobacco: None  . Alcohol Use: No  . Drug Use: No  . Sexual Activity: None   Other Topics Concern  . None   Social History Narrative  . None    Family History: History reviewed. No pertinent family history.  Allergies: No Known Allergies  Current Facility-Administered Medications  Medication Dose Route Frequency Provider Last Rate Last Dose  . aspirin tablet 81 mg  81 mg Oral Daily Shanda Howells, MD      . Derrill Memo ON 10/16/2013] clopidogrel (PLAVIX) tablet 75 mg  75 mg Oral Q breakfast Shanda Howells, MD      . enoxaparin (LOVENOX) injection 30 mg  30 mg Subcutaneous Q24H Shanda Howells, MD      . senna-docusate (Senokot-S) tablet 1 tablet  1 tablet Oral QHS PRN Shanda Howells, MD       Current Outpatient Prescriptions  Medication Sig Dispense Refill  . aspirin 81 MG tablet Take 81 mg by mouth daily.      . clopidogrel (PLAVIX) 75 MG tablet Take 75 mg by mouth daily with breakfast.      . metFORMIN (GLUCOPHAGE-XR) 500 MG 24 hr tablet Take 500 mg by mouth 2 (two) times daily.       Review Of Systems: 12 point ROS negative except  as noted above in HPI.  Physical Exam: Filed Vitals:   10/15/13 1915  BP: 137/63  Pulse: 63  Temp:   Resp: 25    General: alert, cooperative and dysarthric  HEENT: PERRLA, extra ocular movement intact and mild R sided facial droop Heart: S1, S2 normal, no murmur, rub or gallop, regular rate and rhythm Lungs: clear to auscultation, no wheezes or rales and unlabored breathing Abdomen: abdomen is soft without significant tenderness, masses, organomegaly or guarding Extremities: mild RUE weakness Skin:no rashes, no ecchymoses Neurology: R sided facial droop and mild RUE weakness, otherwise  normal neuro exam  Labs and Imaging: Lab Results  Component Value Date/Time   NA 142 10/15/2013  6:00 PM   K 4.2 10/15/2013  6:00 PM   CL 99 10/15/2013  6:00 PM   CO2 25 10/15/2013  5:47 PM   BUN 26* 10/15/2013  6:00 PM   CREATININE 0.90 10/15/2013  6:00 PM   GLUCOSE 212* 10/15/2013  6:00 PM   Lab Results  Component Value Date   WBC 14.2* 10/15/2013   HGB 13.9 10/15/2013   HCT 41.0 10/15/2013   MCV 93.3 10/15/2013   PLT 147* 10/15/2013    Ct Head Wo Contrast  10/15/2013   CLINICAL DATA:  Code stroke. Difficult with speech, confusion, and possible left sided weakness.  EXAM: CT HEAD WITHOUT CONTRAST  TECHNIQUE: Contiguous axial images were obtained from the base of the skull through the vertex without contrast.  COMPARISON:  MR brain 06/18/2007.  FINDINGS: Generalized atrophy. Moderate to severe microvascular ischemic change. No evidence for acute infarction, hemorrhage, mass lesion, hydrocephalus, or extra-axial fluid. Calvarium intact. Vascular calcification. No sinus or mastoid disease.  IMPRESSION: No acute intracranial abnormality. Atrophy and small vessel disease.  Critical Value/emergent results were called by telephone at the time of interpretation on 10/15/2013 at 6:05 PM to Dr. Joseph Berkshire , who verbally acknowledged these results.   Electronically Signed   By: Rolla Flatten M.D.   On: 10/15/2013 18:05           Shanda Howells MD  Pager: [phone]

## 2013-10-15 NOTE — Progress Notes (Signed)
   Subjective:    Patient ID: LORCAN SHELP, male    DOB: 08-22-1923, 80 y.o.   MRN: [id number]  HPI  Cc: difficulty with speeh and finding words and Difficulty swallowing  HPI: 80 year old man comes in with his wife with cc of acute onset of difficulty with speech and swallowing since 10 am this am. Wife urging him to come to er but pt refused and agreed to urgicare this eve. Similar symptoms when he had a stroke 3 years ago. Takes plavix and asa, no head trauma, was fine last night and when he was up this am until acute symptoms onset at 10 am. No cp no sob no difficulty with ambulation gait or muscle strength. No confusion. Compliant with meds. Symptoms moderate in severity and constant since 10 am.   Review of Systems  Constitutional: Positive for fatigue.  HENT: Negative for drooling.   Eyes: Negative for visual disturbance.  Respiratory: Negative for chest tightness.   Cardiovascular: Negative for chest pain and palpitations.  Neurological: Positive for speech difficulty. Negative for weakness.  All other systems reviewed and are negative.      Objective:   Physical Exam  Nursing note and vitals reviewed. Constitutional: He is oriented to person, place, and time. He appears well-developed and well-nourished.  HENT:  Head: Normocephalic and atraumatic.  Right Ear: External ear normal.  Left Ear: External ear normal.  Nose: Nose normal.  Mouth/Throat: Oropharynx is clear and moist.  Left facial drop and left ptosis  Eyes: Conjunctivae and EOM are normal. Pupils are equal, round, and reactive to light.  Neck: Normal range of motion. Neck supple.  Cardiovascular: Normal rate, regular rhythm, normal heart sounds and intact distal pulses.   Pulmonary/Chest: Effort normal and breath sounds normal.  Abdominal: Soft. Bowel sounds are normal.  Musculoskeletal: Normal range of motion.  Neurological: He is alert and oriented to person, place, and time. He has normal reflexes. He  displays normal reflexes. No cranial nerve deficit. He exhibits normal muscle tone. Coordination normal.  Muscle strength equal bilat  Skin: Skin is warm and dry.  echhymosis on extremities  Psychiatric: He has a normal mood and affect. His behavior is normal. Judgment and thought content normal.   accuchek 235 ekg   Results for orders placed in visit on 10/15/13  GLUCOSE, POCT (MANUAL RESULT ENTRY)      Result Value Ref Range   POC Glucose 235 (*) 70 - 99 mg/dl   EKG: nsr hr of 91 rbbb lahb bifasicular block nssttchanges intervals wnl     Assessment & Plan:  Patient with symptoms of acute cva ems notified to emergently transport pt to the er for further evaluation and treatment.

## 2013-10-15 NOTE — Consult Note (Signed)
Referring Physician: Dr. Betsey Holiday    Chief Complaint: new-onset speech and swallowing difficulty.  HPI: Clinton [name] is an 80 y.o. male with a history of diabetes mellitus and small stroke 3 years ago presenting with new onset speech and swallowing difficulty starting at 10 AM today. Patient has been taking aspirin and Plavix daily. CT scan of his head showed no acute intracranial abnormality. Atrophy and small vessel disease were noted. NIH stroke score was 4.  LSN: 10 AM on 10/15/2013 tPA Given: No: beyond time window for treatment consideration; mild deficits only. MRankin: 1  Past Medical History  Diagnosis Date  . Diabetes mellitus without complication   . Stroke     History reviewed. No pertinent family history.   Medications: I have reviewed the patient's current medications.  ROS: History obtained from spouse and the patient  General ROS: negative for - chills, fatigue, fever, night sweats, weight gain or weight loss Psychological ROS: negative for - behavioral disorder, hallucinations, memory difficulties, mood swings or suicidal ideation Ophthalmic ROS: negative for - blurry vision, double vision, eye pain or loss of vision ENT ROS: negative for - epistaxis, nasal discharge, oral lesions, sore throat, tinnitus or vertigo Allergy and Immunology ROS: negative for - hives or itchy/watery eyes Hematological and Lymphatic ROS: negative for - bleeding problems, bruising or swollen lymph nodes Endocrine ROS: negative for - galactorrhea, hair pattern changes, polydipsia/polyuria or temperature intolerance Respiratory ROS: negative for - cough, hemoptysis, shortness of breath or wheezing Cardiovascular ROS: negative for - chest pain, dyspnea on exertion, edema or irregular heartbeat Gastrointestinal ROS: negative for - abdominal pain, diarrhea, hematemesis, nausea/vomiting or stool incontinence Genito-Urinary ROS: negative for - dysuria, hematuria, incontinence or urinary  frequency/urgency Musculoskeletal ROS: negative for - joint swelling or muscular weakness Neurological ROS: as noted in HPI Dermatological ROS: negative for rash and skin lesion changes  Physical Examination: Blood pressure 145/64, pulse 67, temperature 98.3 F (36.8 C), temperature source Tympanic, resp. rate 16, SpO2 98.00%.  Neurologic Examination: Mental Status: Alert, oriented, thought content appropriate.  Speech moderately slurred without evidence of aphasia. Able to follow commands without difficulty. Cranial Nerves: II-Visual fields were normal. III/IV/VI-Pupils were equal and reacted. Extraocular movements were full and conjugate.    V/VII-no facial numbness; mild left lower facial weakness. VIII-normal. X-moderate dysarthria. XII-midline tongue extension Motor: 5/5 bilaterally with normal tone and bulk Sensory: Normal throughout. Deep Tendon Reflexes: 1+ and symmetric. Plantars: mute bilaterally Cerebellar: Normal finger-to-nose testing; mild coordination difficulty heel-to-shin testing with left lower extremity.  Ct Head Wo Contrast  10/15/2013   CLINICAL DATA:  Code stroke. Difficult with speech, confusion, and possible left sided weakness.  EXAM: CT HEAD WITHOUT CONTRAST  TECHNIQUE: Contiguous axial images were obtained from the base of the skull through the vertex without contrast.  COMPARISON:  MR brain 06/18/2007.  FINDINGS: Generalized atrophy. Moderate to severe microvascular ischemic change. No evidence for acute infarction, hemorrhage, mass lesion, hydrocephalus, or extra-axial fluid. Calvarium intact. Vascular calcification. No sinus or mastoid disease.  IMPRESSION: No acute intracranial abnormality. Atrophy and small vessel disease.  Critical Value/emergent results were called by telephone at the time of interpretation on 10/15/2013 at 6:05 PM to Dr. Joseph Berkshire , who verbally acknowledged these results.   Electronically Signed   By: Rolla Flatten M.D.   On:  10/15/2013 18:05    Assessment: 80 y.o. male with a history of diabetes mellitus and previous cerebral infarction, presenting with likely acute recurrent subcortical cerebral infarction.  Stroke Risk  Factors - diabetes mellitus  Plan: 1. HgbA1c, fasting lipid panel 2. MRI, MRA  of the brain without contrast 3. PT consult, OT consult, Speech consult 4. Echocardiogram 5. Carotid dopplers 6. Prophylactic therapy-continue aspirin 81 mg per day and Plavix 75 mg per day ( or aspirin 300 mg suppository if unable to swallow safely) 7. Risk factor modification 8. Telemetry monitoring   C.R. Nicole Kindred, MD [facility] [phone]  10/15/2013, 6:23 PM

## 2013-10-15 NOTE — ED Notes (Signed)
Pt to ED from [facility] for L sided facial droop, difficulty speaking, and difficulty swallowing. LSW per EMS 10am. Has 3 year hx of difficulty swallowing

## 2013-10-15 NOTE — ED Provider Notes (Signed)
Patient presented to the ER with possible stroke. Patient brought to the ER by EMS as a code stroke. Patient had onset of symptoms at 10 AM, however. Patient with left-sided weakness and difficulty with speech.  Face to face Exam: HEENT - PERRLA Lungs - CTAB Heart - RRR, no M/R/G Abd - S/NT/ND Neuro - alert, oriented x3. Blunting of both nasolabial folds. Difficulty with speech intermittently, possible expressive aphasia  Plan: Evaluated as a code stroke in conjunction with neurology. Workup for stroke initiated. Initial head CT negative. Patient will require admission for further workup.   Orpah Greek, MD 10/15/13 [phone]

## 2013-10-15 NOTE — ED Notes (Signed)
All personal belongings sent with pt.

## 2013-10-16 ENCOUNTER — Encounter (HOSPITAL_COMMUNITY): Payer: Self-pay | Admitting: *Deleted

## 2013-10-16 ENCOUNTER — Inpatient Hospital Stay (HOSPITAL_COMMUNITY): Payer: Medicare Other

## 2013-10-16 DIAGNOSIS — E119 Type 2 diabetes mellitus without complications: Secondary | ICD-10-CM

## 2013-10-16 DIAGNOSIS — G459 Transient cerebral ischemic attack, unspecified: Secondary | ICD-10-CM

## 2013-10-16 DIAGNOSIS — D72829 Elevated white blood cell count, unspecified: Secondary | ICD-10-CM

## 2013-10-16 LAB — HEMOGLOBIN A1C
Hgb A1c MFr Bld: 6 % — ABNORMAL HIGH (ref <5.7)
Mean Plasma Glucose: 126 mg/dL — ABNORMAL HIGH (ref <117)

## 2013-10-16 LAB — GLUCOSE, CAPILLARY
GLUCOSE-CAPILLARY: 102 mg/dL — AB (ref 70–99)
Glucose-Capillary: 117 mg/dL — ABNORMAL HIGH (ref 70–99)
Glucose-Capillary: 118 mg/dL — ABNORMAL HIGH (ref 70–99)
Glucose-Capillary: 144 mg/dL — ABNORMAL HIGH (ref 70–99)
Glucose-Capillary: 125 mg/dL — ABNORMAL HIGH (ref 70–99)

## 2013-10-16 LAB — LIPID PANEL
CHOL/HDL RATIO: 2 ratio
CHOLESTEROL: 146 mg/dL (ref 0–200)
HDL: 73 mg/dL (ref >39)
LDL Cholesterol: 61 mg/dL (ref 0–99)
Triglycerides: 62 mg/dL (ref <150)
VLDL: 12 mg/dL (ref 0–40)

## 2013-10-16 MED ORDER — LIVING WELL WITH DIABETES BOOK
Freq: Once | Status: AC
  Administered 2013-10-16: 02:00:00
  Filled 2013-10-16: qty 1

## 2013-10-16 MED ORDER — STROKE: EARLY STAGES OF RECOVERY BOOK
Freq: Once | Status: AC
  Administered 2013-10-16: 02:00:00
  Filled 2013-10-16: qty 1

## 2013-10-16 MED ORDER — BIOTENE DRY MOUTH MT LIQD
15.0000 mL | Freq: Two times a day (BID) | OROMUCOSAL | Status: DC
  Administered 2013-10-17: 15 mL via OROMUCOSAL

## 2013-10-16 NOTE — Progress Notes (Signed)
  Echocardiogram 2D Echocardiogram has been performed.  Clinton [name] 10/16/2013, 10:04 AM

## 2013-10-16 NOTE — Progress Notes (Signed)
INITIAL NUTRITION ASSESSMENT  DOCUMENTATION CODES Per approved criteria  -Not Applicable   INTERVENTION: Diet advancement per MD Provide Glucerna Shake BID when diet advanced (pt prefers chocolate)  NUTRITION DIAGNOSIS: Predicted suboptimal energy intake related to past and present stroke as evidenced by history of weight loss and current NPO status.    Goal: Pt to meet >/= 90% of their estimated nutrition needs   Monitor:  Diet advancement, PO intake, weight trend, lasb  Reason for Assessment: Malnutrition Screening Tool, score of 2  80 y.o. male  Admitting Dx: CVA (cerebral infarction)  ASSESSMENT: 80 y.o. year old male with noted prior hx/o CVA with dysarthria deficit, DM, HTN presenting with CVA. Pt states that he started having difficulty swallowing earlier in the day. Has baseline difficulty with swallowing from previous stroke. Became acutely worse.  Pt reports that before his last stroke 3-4 years ago he weighed 150-155 lbs. He reports gradual weight loss since due to muscle atrophy and decreased appetite. Based on pt's report he has lost 21-24% of his body weight in the past 3-4 years. He states his appetite has been pretty good the past few weeks PTA and he has been eating well.  Pt agreeable to trying Glucerna Shakes. Encouraged pt to continue intake of one shake daily after discharge to help promote weight maintenance.   Nutrition Focused Physical Exam:  Subcutaneous Fat:  Orbital Region: WNL Upper Arm Region: mild wasting Thoracic and Lumbar Region: NA  Muscle:  Temple Region: moderate wasting Clavicle Bone Region: moderate wasting Clavicle and Acromion Bone Region: mild wasting Scapular Bone Region: NA Dorsal Hand: mild wasting Patellar Region: severe wasting Anterior Thigh Region: moderate/severe wasting Posterior Calf Region: mild wasting  Edema: none noted   Height: Ht Readings from Last 1 Encounters:  10/16/13 5\' 6"  (1.676 m)    Weight: Wt  Readings from Last 1 Encounters:  10/16/13 119 lb (53.978 kg)    Ideal Body Weight: 142 lbs  % Ideal Body Weight: 84%  Wt Readings from Last 10 Encounters:  10/16/13 119 lb (53.978 kg)    Usual Body Weight: 150 lbs (3-4 years ago)  % Usual Body Weight: 79%  BMI:  Body mass index is 19.22 kg/(m^2).  Estimated Nutritional Needs: Kcal: 1600-1750 Protein: 70-80 grams Fluid: 1.6 L/day  Skin: WDL  Diet Order: NPO  EDUCATION NEEDS: -No education needs identified at this time  No intake or output data in the 24 hours ending 10/16/13 1045  Last BM: 5/31   Labs:   Recent Labs Lab 10/15/13 1747 10/15/13 1800  NA 140 142  K 4.4 4.2  CL 103 99  CO2 25  --   BUN 26* 26*  CREATININE 0.84 0.90  CALCIUM 9.6  --   GLUCOSE 215* 212*    CBG (last 3)   Recent Labs  10/15/13 1808 10/16/13 0421 10/16/13 0813  GLUCAP 189* 117* 118*    Scheduled Meds: . aspirin  300 mg Rectal Daily  . clopidogrel  75 mg Oral Q breakfast  . enoxaparin (LOVENOX) injection  30 mg Subcutaneous Q24H    Continuous Infusions:   Past Medical History  Diagnosis Date  . Diabetes mellitus without complication   . Stroke   . Hypertension     Past Surgical History  Procedure Laterality Date  . Eye surgery    . Cardiac catheterization      Pryor Ochoa RD, LDN Inpatient Clinical Dietitian Pager: [phone] After Hours Pager: [phone]

## 2013-10-16 NOTE — Progress Notes (Signed)
Report received from off-going RN, patient stable. In the chair with wife at the bedside.

## 2013-10-16 NOTE — ED Provider Notes (Signed)
I saw and evaluated the patient, reviewed the resident's note and I agree with the findings and plan.   EKG Interpretation   Date/Time:  Monday October 15 2013 17:59:58 EDT Ventricular Rate:  87 PR Interval:  167 QRS Duration: 139 QT Interval:  404 QTC Calculation: 486 R Axis:   -77 Text Interpretation:  Sinus rhythm RBBB and LAFB Non-specific ST-t changes  No significant change since last tracing Confirmed by Alrick Cubbage  MD,  Michoel Kunin [phone]) on 10/15/2013 6:07:55 PM      Patient brought to the department as a code stroke. Patient presented far out of the window for intervention, however. Examination reveals mild confusion and slight uncoordination, no focal findings. Evaluated in conjunction with neurology. Neurology recommendation for admission. Patient admitted to internal medicine.  Orpah Greek, MD 10/16/13 [phone]

## 2013-10-16 NOTE — Progress Notes (Signed)
OT Cancellation Note  Patient Details Name: Clinton [name] MRN: [id number] DOB: 29-May-1923   Cancelled Treatment:    Reason Eval/Treat Not Completed: Patient at procedure or test/ unavailable OT to reattempt.  Hortencia Pilar 10/16/2013, 9:40 AM

## 2013-10-16 NOTE — Evaluation (Signed)
Occupational Therapy Evaluation Patient Details Name: Clinton [name] MRN: [id number] DOB: 01-Feb-1924 Today's Date: 10/16/2013    History of Present Illness This is a 80 y.o. year old male with noted prior hx/o CVA with dysarthria deficit, DM, HTN presenting with CVA. Pt states that he started having difficulty swallowing earlier in the day. Has baseline difficulty with swallowing from previous stroke. Became acutely worse. Pt's wife took him to [facility]. There R sided facial droop was noted. Pt was then directed to ER as code stroke. MRI confirmed acute pontine infarcts.   Clinical Impression   Pt seen for acute OT eval. Pt ambulating and performing transfers at min guard. Pt and spouse report pt is near/at baseline with functional mobility. Recommended to pt and spouse installing grab bars by toilet and in shower and to consider a seat for the shower.    Follow Up Recommendations  No OT follow up    Equipment Recommendations       Recommendations for Other Services       Precautions / Restrictions Precautions Precautions: Fall Restrictions Weight Bearing Restrictions: No      Mobility Bed Mobility               General bed mobility comments: not assessed - pt in recliner at start and end of session  Transfers Overall transfer level: Needs assistance   Transfers: Sit to/from Stand Sit to Stand: Min guard         General transfer comment: sit <> stand transfer from recliner and toilet min guard. Some initial unsteadiness upon standing with pt able to correct.    Balance Overall balance assessment: Modified Independent                                          ADL Overall ADL's : Modified independent;At baseline                                       General ADL Comments: Pt needing extra time for ADLs. Recommended to pt and spouse installing grab bars by toilet and shower and to consider shower seat. Pt completed toilet  transfer to elevated height without use grab bars with supervision.     Vision     Ocular Range of Motion: Within Functional Limits Tracking/Visual Pursuits: Decreased smoothness of horizontal tracking         Additional Comments: Pt reading newspaper upon arrival; states no change in vision from baseline.    Perception     Praxis      Pertinent Vitals/Pain No c/o of pain     Hand Dominance Right   Extremity/Trunk Assessment Upper Extremity Assessment Upper Extremity Assessment: Overall WFL for tasks assessed;RUE deficits/detail RUE Deficits / Details: baseline 80-85 AROM at R shoulder level; strength 4+/5   Lower Extremity Assessment Lower Extremity Assessment: Defer to PT evaluation   Cervical / Trunk Assessment Cervical / Trunk Assessment: Kyphotic   Communication Communication Communication: Expressive difficulties   Cognition Arousal/Alertness: Awake/alert Behavior During Therapy: WFL for tasks assessed/performed Overall Cognitive Status: Within Functional Limits for tasks assessed                     General Comments       Exercises  Shoulder Instructions      Home Living Family/patient expects to be discharged to:: Private residence Living Arrangements: Spouse/significant other Available Help at Discharge: Family;Available 24 hours/day Type of Home: House Home Access: Stairs to enter [employer] of Steps: 4 Entrance Stairs-Rails: Right Home Layout: Two level;Able to live on main level with bedroom/bathroom Alternate Level Stairs-Number of Steps: 12 Alternate Level Stairs-Rails: Left     Bathroom Toilet: Standard     Home Equipment: Cane - single point;Walker - 2 wheels   Additional Comments: does not use AD   Lives With: Spouse    Prior Functioning/Environment Level of Independence: Independent             OT Diagnosis:     OT Problem List:     OT Treatment/Interventions:      OT Goals(Current goals  can be found in the care plan section) Acute Rehab OT Goals Patient Stated Goal: not stated  OT Frequency:     Barriers to D/C:            Co-evaluation              End of Session Equipment Utilized During Treatment: Gait belt  Activity Tolerance: Patient tolerated treatment well Patient left: in chair;with call bell/phone within reach;with family/visitor present   Time: 7619-5093 OT Time Calculation (min): 20 min Charges:  OT General Charges $OT Visit: 1 Procedure OT Evaluation $Initial OT Evaluation Tier I: 1 Procedure OT Treatments $Self Care/Home Management : 8-22 mins G-Codes:    Hortencia Pilar 02-Nov-2013, 12:56 PM

## 2013-10-16 NOTE — Progress Notes (Signed)
Stroke Team Progress Note  HISTORY Clinton [name] is an 80 y.o. male with a history of diabetes mellitus and small stroke 3 years ago presenting with new onset speech and swallowing difficulty starting at 10 AM today 10/15/2013. Patient has been taking aspirin and Plavix daily. CT scan of his head showed no acute intracranial abnormality. Atrophy and small vessel disease were noted. NIH stroke score was 4. Patient was not administered TPA secondary to beyond time window for treatment consideration; mild deficits only.  He was admitted for further evaluation and treatment.  SUBJECTIVE He is currently in the vascular lab.  Overall he feels his condition is stable.   OBJECTIVE Most recent Vital Signs: Filed Vitals:   10/16/13 0200 10/16/13 0400 10/16/13 0619 10/16/13 0816  BP: 105/51 100/46 106/52 111/53  Pulse: 73 66 74 50  Temp: 98 F (36.7 C) 98.4 F (36.9 C) 98.6 F (37 C) 98 F (36.7 C)  TempSrc: Oral Oral Oral Oral  Resp: 20 20 20 18   Height:      Weight:      SpO2: 97% 97% 97% 99%   CBG (last 3)   Recent Labs  10/15/13 1808 10/16/13 0421 10/16/13 0813  GLUCAP 189* 117* 118*    IV Fluid Intake:     MEDICATIONS  . aspirin  300 mg Rectal Daily  . clopidogrel  75 mg Oral Q breakfast  . enoxaparin (LOVENOX) injection  30 mg Subcutaneous Q24H   PRN:  senna-docusate  Diet:  NPO  Activity:   DVT Prophylaxis:  Lovenox 30 mg sq daily  CLINICALLY SIGNIFICANT STUDIES Basic Metabolic Panel:   Recent Labs Lab 10/15/13 1747 10/15/13 1800  NA 140 142  K 4.4 4.2  CL 103 99  CO2 25  --   GLUCOSE 215* 212*  BUN 26* 26*  CREATININE 0.84 0.90  CALCIUM 9.6  --    Liver Function Tests:   Recent Labs Lab 10/15/13 1747  AST 24  ALT 15  ALKPHOS 67  BILITOT 0.6  PROT 6.4  ALBUMIN 3.5   CBC:   Recent Labs Lab 10/15/13 1747 10/15/13 1800  WBC 14.2*  --   NEUTROABS 12.5*  --   HGB 12.7* 13.9  HCT 37.7* 41.0  MCV 93.3  --   PLT 147*  --    Coagulation:    Recent Labs Lab 10/15/13 1747  LABPROT 13.0  INR 1.00   Cardiac Enzymes: No results found for this basename: CKTOTAL, CKMB, CKMBINDEX, TROPONINI,  in the last 168 hours Urinalysis:   Recent Labs Lab 10/15/13 1913  COLORURINE YELLOW  LABSPEC 1.024  PHURINE 6.0  GLUCOSEU 500*  HGBUR TRACE*  BILIRUBINUR NEGATIVE  KETONESUR 15*  PROTEINUR NEGATIVE  UROBILINOGEN 0.2  NITRITE NEGATIVE  LEUKOCYTESUR NEGATIVE   Lipid Panel    Component Value Date/Time   CHOL 146 10/16/2013 0643   TRIG 62 10/16/2013 0643   HDL 73 10/16/2013 0643   CHOLHDL 2.0 10/16/2013 0643   VLDL 12 10/16/2013 0643   LDLCALC 61 10/16/2013 0643   HgbA1C  No results found for this basename: HGBA1C    Urine Drug Screen:     Component Value Date/Time   LABOPIA NONE DETECTED 10/15/2013 1913   COCAINSCRNUR NONE DETECTED 10/15/2013 1913   LABBENZ NONE DETECTED 10/15/2013 1913   AMPHETMU NONE DETECTED 10/15/2013 1913   THCU NONE DETECTED 10/15/2013 1913   LABBARB NONE DETECTED 10/15/2013 1913    Alcohol Level:   Recent Labs Lab 10/15/13 1747  ETH <11   CT of the brain  10/15/2013    No acute intracranial abnormality. Atrophy and small vessel disease.    MRI of the brain  10/16/2013   Acute pontine infarcts.  Remote bilateral thalamic, basal ganglia lacunar infarcts with remote right corona radiata infarct previously seen. Remote left basal ganglia hemorrhagic infarct, with punctate focus of susceptibility artifact in the right thalamus most likely reflecting hemorrhagic infarct.  Moderate white matter changes suggest chronic small vessel ischemic disease.    MRA of the brain  10/16/2013   Asymmetric flow related enhancement of left vertebral artery, which could reflect slow flow, and may be better characterized on CT of the head clinically indicated. No large vessel occlusion. Less than 50% narrowing of the right posterior cerebral artery.   2D Echocardiogram    Carotid Doppler    CXR  10/15/2013   No acute cardiopulmonary  disease.   EKG  normal sinus rhythm. For complete results please see formal report.   Therapy Recommendations   Physical Exam  No PT Frail elderly caucasian male not in distress.Awake alert. Afebrile. Head is nontraumatic. Neck is supple without bruit. Hearing is normal. Cardiac exam no murmur or gallop. Lungs are clear to auscultation. Distal pulses are well felt. Neurological Exam : Awake alert oriented x2. Pseudobulbar dysarthria but can be understood with some difficulty. Fundi are not visualized. Vision acuity seems adequate. Blinks to threat bilaterally. Mild bifacial weakness right greater than left. Tongue midline. Portal system exam reveals mild right-sided weakness with right grip and intrinsic hand muscle weakness. Fine finger movements are diminished in the right. Orbits left-to-right upper extremity Mild right hip flexor ankle a subtle weakness. Deep tendon reflexes are brisk and symmetric left than right. Both plantars upgoing. There is mild paratonia bilaterally. Sensation appears preserved. Gait was not tested. ASSESSMENT [name] is a 80 y.o. male presenting with new-onset speech and swallowing difficulty. Imaging confirms acute pontine infarcts in the setting of old bilateral thalamic, basal ganglia lacunar infarcts and old right corona radiata and left basal ganglia hemorrhagic infarct. Current infarcts felt to be thrombotic secondary to small vessel disease.  On aspirin 81 mg orally every day and clopidogrel 75 mg orally every day prior to admission. Now on clopidogrel 75 mg orally every day and aspirin suppository 300 mg daily for secondary stroke prevention as pt in NPO. Patient with resultant right hemiparesis, pseudobublar dysarthria, brisk jaw jerks, dysphagia. Stroke work up underway.  hypertension Diabetes, HgbA1c pending, goal < 7.0 Hx stroke with dysarthria  LDL 61  Hospital day # 1  TREATMENT/PLAN  Once able to swallow, change aspirin and plavix to   dipyridamole SR 250 mg/aspirin 25 mg orally twice a day for secondary stroke prevention. To prevent headache, most common side effect of Aggrenox, will start Aggrenox q hs x 2 weeks then increase Aggrenox to bid.  Until then, aspirin 81 mg q am x 2 weeks, then discontinue. May take Tylenol 650 mg 1 hr prior to Aggrenox for the first week, then discontinue. For now, will continue aspirin suppository 300 mg daily.  F/u 2D, Carotid doppler, HgbA1c  OOB, therapy evals  SIGNED Burnetta Sabin, MSN, RN, ANVP-BC, ANP-BC, GNP-BC [facility] Pager: [phone] 10/16/2013 1:51 PM   I have personally obtained a history, examined the patient, evaluated imaging results, and formulated the assessment and plan of care. I agree with the above. Antony Contras, MD   To contact Stroke Continuity provider, please refer to  [email or web address] After hours, contact General Neurology

## 2013-10-16 NOTE — Procedures (Signed)
Objective Swallowing Evaluation: Modified Barium Swallowing Study  Patient Details  Name: DRAPER GALLON MRN: [id number] Date of Birth: 1923-08-13  Today's Date: 10/16/2013 Time: 1300-1330 SLP Time Calculation (min): 30 min  Past Medical History:  Past Medical History  Diagnosis Date  . Diabetes mellitus without complication   . Stroke   . Hypertension    Past Surgical History:  Past Surgical History  Procedure Laterality Date  . Eye surgery    . Cardiac catheterization     HPI:  80 y.o. year old male with noted prior hx/o CVA with dysarthria deficit, DM, HTN presenting with CVA. Imaging studies positive for pons CVA.  Pt states that he started having difficulty swallowing earlier in the day prior to admit. Has baseline difficulty with swallowing from previous stroke characterized by coughing with liquids. Became acutely worse.  BSE and SLE ordered.      Assessment / Plan / Recommendation Clinical Impression  Dysphagia Diagnosis: Moderate pharyngeal phase dysphagia;Mild oral phase dysphagia Clinical impression:   Pt presents with a mild oral dysphagia with rocking lingual movement to slowly propel formed boluses to pharynx without oral residue.   Oropharyngeal dysphagia is moderate to severe with primary sensory deficits and mild motor deficits. The pts swallow initiation is signficantly delayed with nectar and honey boluses pooling in pyriforms prior the swallow, resulting in gross silent aspiration of nectar thick liquids as the swallow was initiated. Only honey teaspoon was sufficiently small and thick enough to allow pt to fully protect airway during the swallow. There were also moderate base of tongue weakness and mildly decreased laryngeal elevation with residuals reamaining in the valleculae and a small amount above the CP segment, cleared with a cued second swallow. A chin tuck increased penetration and did not reduce residue.   Pt is recommended to consume dys 2 (fine chop)  with honey thick liquids via teaspoon with a second swallow. Discussed restricitive nature of this recommendation with pt and high risk of aspiration with any thinner textures. Pt verbalized understanding but was encouraged to discuss it with his family.     Treatment Recommendation  Therapy as outlined in treatment plan below    Diet Recommendation Dysphagia 2 (Fine chop);Honey-thick liquid   Liquid Administration via: Spoon Medication Administration: Whole meds with puree Supervision: Staff to assist with self feeding;Full supervision/cueing for compensatory strategies Compensations: Slow rate;Small sips/bites;Multiple dry swallows after each bite/sip Postural Changes and/or Swallow Maneuvers: Seated upright 90 degrees    Other  Recommendations Recommended Consults: MBS Oral Care Recommendations: Oral care BID Other Recommendations: Order thickener from pharmacy   Follow Up Recommendations  Home health SLP;Outpatient SLP    Frequency and Duration min 2x/week  2 weeks   Pertinent Vitals/Pain NA    SLP Swallow Goals     General Date of Onset: 10/16/13 HPI: 80 y.o. year old male with noted prior hx/o CVA with dysarthria deficit, DM, HTN presenting with CVA. Imaging studies positive for pons CVA.  Pt states that he started having difficulty swallowing earlier in the day prior to admit. Has baseline difficulty with swallowing from previous stroke characterized by coughing with liquids. Became acutely worse.  BSE and SLE ordered.  Type of Study: Modified Barium Swallowing Study Reason for Referral: Objectively evaluate swallowing function Diet Prior to this Study: NPO Temperature Spikes Noted: No Respiratory Status: Room air History of Recent Intubation: No Behavior/Cognition: Alert;Cooperative;Pleasant mood Oral Cavity - Dentition: Adequate natural dentition Oral Motor / Sensory Function: Impaired - see Bedside swallow  eval Self-Feeding Abilities: Able to feed self;Needs  assist Patient Positioning: Upright in chair Baseline Vocal Quality: Low vocal intensity;Breathy Volitional Cough: Strong Volitional Swallow: Able to elicit Anatomy: Within functional limits    Reason for Referral Objectively evaluate swallowing function   Oral Phase Oral Preparation/Oral Phase Oral Phase: Impaired Oral - Honey Oral - Honey Teaspoon: Lingual pumping;Delayed oral transit Oral - Honey Cup: Lingual pumping;Delayed oral transit Oral - Nectar Oral - Nectar Teaspoon: Lingual pumping;Delayed oral transit Oral - Nectar Cup: Lingual pumping;Delayed oral transit Oral - Solids Oral - Puree: Lingual pumping;Delayed oral transit Oral - Mechanical Soft: Lingual pumping;Delayed oral transit   Pharyngeal Phase Pharyngeal Phase Pharyngeal Phase: Impaired Pharyngeal - Honey Pharyngeal - Honey Teaspoon: Delayed swallow initiation;Reduced epiglottic inversion;Reduced laryngeal elevation;Reduced tongue base retraction;Pharyngeal residue - valleculae;Pharyngeal residue - cp segment;Compensatory strategies attempted (Comment) (chin tuck - no improvement) Pharyngeal - Honey Cup: Delayed swallow initiation;Reduced epiglottic inversion;Reduced laryngeal elevation;Reduced tongue base retraction;Pharyngeal residue - valleculae;Pharyngeal residue - cp segment;Compensatory strategies attempted (Comment);Penetration/Aspiration during swallow Penetration/Aspiration details (honey cup): Material enters airway, remains ABOVE vocal cords and not ejected out;Material enters airway, CONTACTS cords and not ejected out Pharyngeal - Nectar Pharyngeal - Nectar Teaspoon: Delayed swallow initiation;Reduced epiglottic inversion;Reduced laryngeal elevation;Reduced tongue base retraction;Pharyngeal residue - valleculae;Pharyngeal residue - cp segment;Compensatory strategies attempted (Comment) Penetration/Aspiration details (nectar teaspoon): Material enters airway, passes BELOW cords without attempt by patient to  eject out (silent aspiration);Material enters airway, CONTACTS cords and not ejected out Pharyngeal - Nectar Cup: Delayed swallow initiation;Reduced epiglottic inversion;Reduced laryngeal elevation;Reduced tongue base retraction;Pharyngeal residue - valleculae;Pharyngeal residue - cp segment;Compensatory strategies attempted (Comment);Penetration/Aspiration during swallow;Significant aspiration (Amount) (90% of bolus) Penetration/Aspiration details (nectar cup): Material enters airway, passes BELOW cords without attempt by patient to eject out (silent aspiration) Pharyngeal - Solids Pharyngeal - Puree: Delayed swallow initiation;Reduced epiglottic inversion;Reduced laryngeal elevation;Reduced tongue base retraction;Pharyngeal residue - valleculae;Pharyngeal residue - cp segment;Compensatory strategies attempted (Comment) Pharyngeal - Mechanical Soft: Delayed swallow initiation;Reduced epiglottic inversion;Reduced laryngeal elevation;Reduced tongue base retraction;Pharyngeal residue - valleculae;Pharyngeal residue - cp segment;Compensatory strategies attempted (Comment)  Cervical Esophageal Phase    GO    Cervical Esophageal Phase Cervical Esophageal Phase: Impaired Cervical Esophageal Phase - Comment Cervical Esophageal Comment: reduced opening of CP segment, mild residue above.         Herbie [place], [place] CCC-SLP [facility] Nawaal Alling 10/16/2013, 2:09 PM

## 2013-10-16 NOTE — Progress Notes (Signed)
UR complete.  Emylee Decelle RN, MSN 

## 2013-10-16 NOTE — Progress Notes (Signed)
[facility] PROGRESS NOTE  Clinton [name] XFG:[id number] DOB: [birth year] DOA: 10/15/2013 PCP: Horton Finer, MD  Assessment/Plan: 1. Acute CVA 1. Await 2d echo results 2. Carotid dopplers pending 3. On ASA and plavix 4. Neurology following 2. DM 1. CBG stable 2. Cont SSI coverage 3. Leukocytosis 1. Afebrile 2. Pt does not appear toxic 3. Question reactive leukocytosis secondary to cva 4. DVT prophylaxis 1. Lovenox  Code Status: Full Family Communication: Pt and wife at bedside Disposition Plan: Pending   Consultants:  Neurology  Procedures:    Antibiotics:    HPI/Subjective: No complaints. No acute events.  Objective: Filed Vitals:   10/16/13 0200 10/16/13 0400 10/16/13 0619 10/16/13 0816  BP: 105/51 100/46 106/52 111/53  Pulse: 73 66 74 50  Temp: 98 F (36.7 C) 98.4 F (36.9 C) 98.6 F (37 C) 98 F (36.7 C)  TempSrc: Oral Oral Oral Oral  Resp: 20 20 20 18   Height:      Weight:      SpO2: 97% 97% 97% 99%   No intake or output data in the 24 hours ending 10/16/13 1021 Filed Weights   10/16/13 0127  Weight: 53.978 kg (119 lb)    Exam:   General:  Awake, in nad  Cardiovascular: regular, s1, s2  Respiratory: normal resp effort, no wheezing  Abdomen: soft,nondistended  Musculoskeletal: perfused, no clubbing  Neuro: 5/5 strength throughout, slurred speech   Data Reviewed: Basic Metabolic Panel:  Recent Labs Lab 10/15/13 1747 10/15/13 1800  NA 140 142  K 4.4 4.2  CL 103 99  CO2 25  --   GLUCOSE 215* 212*  BUN 26* 26*  CREATININE 0.84 0.90  CALCIUM 9.6  --    Liver Function Tests:  Recent Labs Lab 10/15/13 1747  AST 24  ALT 15  ALKPHOS 67  BILITOT 0.6  PROT 6.4  ALBUMIN 3.5   No results found for this basename: LIPASE, AMYLASE,  in the last 168 hours No results found for this basename: AMMONIA,  in the last 168 hours CBC:  Recent Labs Lab 10/15/13 1747 10/15/13 1800  WBC 14.2*  --   NEUTROABS 12.5*   --   HGB 12.7* 13.9  HCT 37.7* 41.0  MCV 93.3  --   PLT 147*  --    Cardiac Enzymes: No results found for this basename: CKTOTAL, CKMB, CKMBINDEX, TROPONINI,  in the last 168 hours BNP (last 3 results) No results found for this basename: PROBNP,  in the last 8760 hours CBG:  Recent Labs Lab 10/15/13 1808 10/16/13 0421 10/16/13 0813  GLUCAP 189* 117* 118*    No results found for this or any previous visit (from the past 240 hour(s)).   Studies: Ct Head Wo Contrast  10/15/2013   CLINICAL DATA:  Code stroke. Difficult with speech, confusion, and possible left sided weakness.  EXAM: CT HEAD WITHOUT CONTRAST  TECHNIQUE: Contiguous axial images were obtained from the base of the skull through the vertex without contrast.  COMPARISON:  MR brain 06/18/2007.  FINDINGS: Generalized atrophy. Moderate to severe microvascular ischemic change. No evidence for acute infarction, hemorrhage, mass lesion, hydrocephalus, or extra-axial fluid. Calvarium intact. Vascular calcification. No sinus or mastoid disease.  IMPRESSION: No acute intracranial abnormality. Atrophy and small vessel disease.  Critical Value/emergent results were called by telephone at the time of interpretation on 10/15/2013 at 6:05 PM to Dr. Joseph Berkshire , who verbally acknowledged these results.   Electronically Signed   By: Michae Kava.D.  On: 10/15/2013 18:05   Mr Brain Wo Contrast  10/16/2013   CLINICAL DATA:  History of stroke, right facial droop bone worsening dysphagia.  EXAM: MRI HEAD WITHOUT CONTRAST  MRA HEAD WITHOUT CONTRAST  TECHNIQUE: Multiplanar, multiecho pulse sequences of the brain and surrounding structures were obtained without intravenous contrast. Angiographic images of the head were obtained using MRA technique without contrast.  COMPARISON:  CT HEAD W/O CM dated 10/15/2013; MR HEAD W/O CM dated 06/18/2007  FINDINGS: MRI HEAD FINDINGS  Two pontine foci of reduced diffusion measuring up to 12 mm, corresponding low  ADC values. No susceptibility artifact to suggest acute hemorrhage ; susceptibility artifact in left posterior putaminal associated with remote infarct. Punctate focus of susceptibility artifact in right thalamus may be new.  Moderate to severe ventriculomegaly, likely on the basis of global parenchymal brain volume loss as there is overall commensurate enlargement of cerebral sulci and cerebellar folia. Remote right posterior corona radiata infarct again seen, with multiple bilateral thalamus and basal ganglia cystic lacunar infarcts, progressed. Moderate white matter changes suggest chronic small vessel ischemic disease. No midline shift or mass effect.  No abnormal extra-axial fluid collections. Status post bilateral ocular lens implants. Bilateral maxillary mucosal retention cyst with mild paranasal sinus mucosal thickening. Trace right greater than left mastoid effusions. No abnormal sellar expansion. No cerebellar tonsillar ectopia. No suspicious calvarial bone marrow signal. Tendons about the odontoid process may reflect CPPD.  MRA HEAD FINDINGS  Anterior circulation: Normal flow related enhancement of the included cervical, the features, cavernous and supra clinoid internal carotid arteries. Normal flow related enhancement of the anterior and middle cerebral arteries, patent anterior communicating artery.  Posterior circulation: Codominant vertebral arteries, with asymmetric flow related enhancement of the vertebral arteries, slightly less robust enhancement of the left vertebral artery which may reflect slow flow. Normal flow related enhancement of the basilar artery and main branch vessels. Normal flow early enhancement of the posterior cerebral arteries with less than 50% narrowing of the right P2/3 segment.  No large vessel occlusion, hemodynamically significant stenosis or aneurysm. No suspicious luminal irregularity.  IMPRESSION: MRI head:  Acute pontine infarcts.  Remote bilateral thalamic, basal  ganglia lacunar infarcts with remote right corona radiata infarct previously seen. Remote left basal ganglia hemorrhagic infarct, with punctate focus of susceptibility artifact in the right thalamus most likely reflecting hemorrhagic infarct.  Moderate white matter changes suggest chronic small vessel ischemic disease.  MRA head: Asymmetric flow related enhancement of left vertebral artery, which could reflect slow flow, and may be better characterized on CT of the head clinically indicated. No large vessel occlusion. Less than 50% narrowing of the right posterior cerebral artery.   Electronically Signed   By: Elon Alas   On: 10/16/2013 02:27   Dg Chest Port 1 View  10/15/2013   CLINICAL DATA:  Trauma.  EXAM: PORTABLE CHEST - 1 VIEW  COMPARISON:  01/22/2011  FINDINGS: Cardiac silhouette is normal in size. Normal mediastinal and hilar contours. Clear lungs. No pleural effusion. No gross pneumothorax on this supine study.  No convincing fracture.  IMPRESSION: No acute cardiopulmonary disease.   Electronically Signed   By: Lajean Manes M.D.   On: 10/15/2013 20:28   Mr Jodene Nam Head/brain Wo Cm  10/16/2013   CLINICAL DATA:  History of stroke, right facial droop bone worsening dysphagia.  EXAM: MRI HEAD WITHOUT CONTRAST  MRA HEAD WITHOUT CONTRAST  TECHNIQUE: Multiplanar, multiecho pulse sequences of the brain and surrounding structures were obtained without intravenous contrast.  Angiographic images of the head were obtained using MRA technique without contrast.  COMPARISON:  CT HEAD W/O CM dated 10/15/2013; MR HEAD W/O CM dated 06/18/2007  FINDINGS: MRI HEAD FINDINGS  Two pontine foci of reduced diffusion measuring up to 12 mm, corresponding low ADC values. No susceptibility artifact to suggest acute hemorrhage ; susceptibility artifact in left posterior putaminal associated with remote infarct. Punctate focus of susceptibility artifact in right thalamus may be new.  Moderate to severe ventriculomegaly, likely on the  basis of global parenchymal brain volume loss as there is overall commensurate enlargement of cerebral sulci and cerebellar folia. Remote right posterior corona radiata infarct again seen, with multiple bilateral thalamus and basal ganglia cystic lacunar infarcts, progressed. Moderate white matter changes suggest chronic small vessel ischemic disease. No midline shift or mass effect.  No abnormal extra-axial fluid collections. Status post bilateral ocular lens implants. Bilateral maxillary mucosal retention cyst with mild paranasal sinus mucosal thickening. Trace right greater than left mastoid effusions. No abnormal sellar expansion. No cerebellar tonsillar ectopia. No suspicious calvarial bone marrow signal. Tendons about the odontoid process may reflect CPPD.  MRA HEAD FINDINGS  Anterior circulation: Normal flow related enhancement of the included cervical, the features, cavernous and supra clinoid internal carotid arteries. Normal flow related enhancement of the anterior and middle cerebral arteries, patent anterior communicating artery.  Posterior circulation: Codominant vertebral arteries, with asymmetric flow related enhancement of the vertebral arteries, slightly less robust enhancement of the left vertebral artery which may reflect slow flow. Normal flow related enhancement of the basilar artery and main branch vessels. Normal flow early enhancement of the posterior cerebral arteries with less than 50% narrowing of the right P2/3 segment.  No large vessel occlusion, hemodynamically significant stenosis or aneurysm. No suspicious luminal irregularity.  IMPRESSION: MRI head:  Acute pontine infarcts.  Remote bilateral thalamic, basal ganglia lacunar infarcts with remote right corona radiata infarct previously seen. Remote left basal ganglia hemorrhagic infarct, with punctate focus of susceptibility artifact in the right thalamus most likely reflecting hemorrhagic infarct.  Moderate white matter changes suggest  chronic small vessel ischemic disease.  MRA head: Asymmetric flow related enhancement of left vertebral artery, which could reflect slow flow, and may be better characterized on CT of the head clinically indicated. No large vessel occlusion. Less than 50% narrowing of the right posterior cerebral artery.   Electronically Signed   By: Elon Alas   On: 10/16/2013 02:27    Scheduled Meds: . aspirin  300 mg Rectal Daily  . clopidogrel  75 mg Oral Q breakfast  . enoxaparin (LOVENOX) injection  30 mg Subcutaneous Q24H   Continuous Infusions:   Active Problems:   Diabetes mellitus   CVA (cerebral infarction)  Time spent: 42min  Clinton [name]  [facility] Pager [phone]. If 7PM-7AM, please contact night-coverage at [email or web address], password [facility] 10/16/2013, 10:21 AM  LOS: 1 day

## 2013-10-16 NOTE — Evaluation (Signed)
Physical Therapy Evaluation Patient Details Name: Clinton [name] MRN: [id number] DOB: 1924/03/26 Today's Date: 10/16/2013   History of Present Illness  This is a 80 y.o. year old male with noted prior hx/o CVA with dysarthria deficit, DM, HTN presenting with CVA. Pt states that he started having difficulty swallowing earlier in the day. Has baseline difficulty with swallowing from previous stroke. Became acutely worse. Pt's wife took him to [facility]. There R sided facial droop was noted. Pt was then directed to ER as code stroke. MRI confirmed acute pontine infarcts.  Clinical Impression  Pt functioning near baseline. Pt functioning at min guard but is safe. Pt aware of his limitations functionally. Acute PT to follow to address higher level balance deficits. Pt with good home set up and support.     Follow Up Recommendations No PT follow up;Supervision/Assistance - 24 hour    Equipment Recommendations  None recommended by PT    Recommendations for Other Services       Precautions / Restrictions Precautions Precautions: Fall Restrictions Weight Bearing Restrictions: No      Mobility  Bed Mobility Overal bed mobility: Needs Assistance Bed Mobility: Supine to Sit     Supine to sit: Min guard     General bed mobility comments: labored effort, definite use of bed rail  Transfers Overall transfer level: Needs assistance   Transfers: Sit to/from Stand Sit to Stand: Min guard         General transfer comment: increased time, good technique  Ambulation/Gait Ambulation/Gait assistance: Min guard Ambulation Distance (Feet): 200 Feet Assistive device: None Gait Pattern/deviations: Step-through pattern;Decreased stride length Gait velocity: wfl for age   General Gait Details: pt with L knee flexion, antalgic gait but steady, pt reports "the doctor says I have bone on bone in both of my hips"  Stairs Stairs: Yes Stairs assistance: Min guard Stair Management:  One rail Right;Alternating pattern Number of Stairs: 5 General stair comments: increased time, strong use of rail  Wheelchair Mobility    Modified Rankin (Stroke Patients Only) Modified Rankin (Stroke Patients Only) Pre-Morbid Rankin Score: No symptoms Modified Rankin: No significant disability     Balance Overall balance assessment: Modified Independent                             High Level Balance Comments: suspect pt would require assist             Pertinent Vitals/Pain Denies pain    Home Living Family/patient expects to be discharged to:: Private residence Living Arrangements: Spouse/significant other Available Help at Discharge: Family;Available 24 hours/day Type of Home: House Home Access: Stairs to enter Entrance Stairs-Rails: Right Entrance Stairs-Number of Steps: 4 Home Layout: Two level;Able to live on main level with bedroom/bathroom Home Equipment: Kasandra Knudsen - single point;Walker - 2 wheels Additional Comments: doesn't use AD, wife is 85 yo    Prior Function Level of Independence: Independent               Hand Dominance   Dominant Hand: Right    Extremity/Trunk Assessment   Upper Extremity Assessment: Overall WFL for tasks assessed;RUE deficits/detail RUE Deficits / Details: pt s/p unsuccessful surgery on R shoulder, active ROM to 85 deg at baseline, otherwise grossly 4+/5         Lower Extremity Assessment: Overall WFL for tasks assessed      Cervical / Trunk Assessment: Kyphotic  Communication   Communication: Expressive  difficulties (garbled speech, dysarthria)  Cognition Arousal/Alertness: Awake/alert Behavior During Therapy: WFL for tasks assessed/performed Overall Cognitive Status: Within Functional Limits for tasks assessed                      General Comments      Exercises        Assessment/Plan    PT Assessment Patient needs continued PT services  PT Diagnosis Difficulty walking   PT  Problem List Decreased strength;Decreased activity tolerance;Decreased balance  PT Treatment Interventions DME instruction;Gait training;Stair training;Functional mobility training;Therapeutic activities;Therapeutic exercise;Balance training   PT Goals (Current goals can be found in the Care Plan section) Acute Rehab PT Goals Patient Stated Goal: home soon PT Goal Formulation: With patient Time For Goal Achievement: 10/23/13 Potential to Achieve Goals: Good    Frequency Min 3X/week   Barriers to discharge        Co-evaluation               End of Session Equipment Utilized During Treatment: Gait belt Activity Tolerance: Patient tolerated treatment well Patient left: in chair;with call bell/phone within reach;with chair alarm set Nurse Communication: Mobility status         Time: 4536-4680 PT Time Calculation (min): 23 min   Charges:   PT Evaluation $Initial PT Evaluation Tier I: 1 Procedure PT Treatments $Gait Training: 8-22 mins   PT G CodesKoleen Distance [place] 10/16/2013, 9:10 AM   Kittie Plater, PT, DPT Pager #: [phone] Office #: [phone]

## 2013-10-16 NOTE — Evaluation (Signed)
SLP Cancellation Note  Patient Details Name: Clinton [name] MRN: [id number] DOB: [birth year]   Cancelled treatment:       Reason Eval/Treat Not Completed: Patient at procedure or test/unavailable (pt off floor, ? for MRI, SLP will continue efforts to evaluate pt as ordered)  Luanna Salk, [facility] SLP [phone]

## 2013-10-16 NOTE — Progress Notes (Signed)
*  PRELIMINARY RESULTS* Vascular Ultrasound Carotid Duplex (Doppler) has been completed.  Preliminary findings: Bilateral:  1-39% ICA stenosis.  Vertebral artery flow is antegrade.      Landry Mellow, RDMS, RVT  10/16/2013, 10:31 AM

## 2013-10-16 NOTE — Evaluation (Signed)
Clinical/Bedside Swallow Evaluation Patient Details  Name: Clinton [name] MRN: [id number] Date of Birth: [birth year]  Today's Date: 10/16/2013 Time: 1120-1140 SLP Time Calculation (min): 20 min  Past Medical History:  Past Medical History  Diagnosis Date  . Diabetes mellitus without complication   . Stroke   . Hypertension    Past Surgical History:  Past Surgical History  Procedure Laterality Date  . Eye surgery    . Cardiac catheterization     HPI:  80 y.o. year old male with noted prior hx/o CVA with dysarthria deficit, DM, HTN presenting with CVA. Pt states that he started having difficulty swallowing earlier in the day. Has baseline difficulty with swallowing from previous stroke characterized by coughing with liquids. Became acutely worse.  BSE and SLE ordered.    Assessment / Plan / Recommendation Clinical Impression  Pt presents with clinical indications of oropharyngeal dysphagia with impact of trigeminal, facial nerves.  Also suspect glossopharyngeal and vagus nerve involvement due to decreased lingual coordination, weak phonation and cough abilities.  Pt with delayed oral transiting due to decreased coordination/strength.   Suspect delayed pharyngeal swallow initiation.and delay with possible aspiration of nectar liquids.  Chin tuck posture improved symptomology but did not eliminate it.  Pt did not appear with significant deficits with solids.  Spouse present and both pt/spouse report pt to have baseline cough/throat clearing and hiccups with intake.    Difficulties with liquids dates back to previous CVA approx 3-4 years ago per pt.  Suspect component of chronic aspiration/multifactorial dysphagia with current exacerbation from pons CVA.  Educated pt/spouse to mitigation strategies for aspiration and need for MBS.    Please note, pt also with weight loss from 150 to 119 in few years - he attributes this to becoming full easily.  Advised him to consider snacking between meals to  maximize intake- note he is being followed by RD.    MBS indicated to allow instrumental assessment and cursory sweep of esophagus.  Pt and spouse agreeable and RN informed.  Recommend NPO pending MBS.      Aspiration Risk  Severe    Diet Recommendation NPO (medicine with applesauce (observed plavix given with applesauce with rn))   Medication Administration: Whole meds with puree    Other  Recommendations Recommended Consults: MBS Oral Care Recommendations: Oral care Q4 per protocol   Follow Up Recommendations       Frequency and Duration min 3x week  3 weeks   Pertinent Vitals/Pain Afebrile, decreased     Swallow Study Prior Functional Status  Type of Home: House Available Help at Discharge: Family;Available 24 hours/day    General Date of Onset: 10/16/13 HPI: 80 y.o. year old male with noted prior hx/o CVA with dysarthria deficit, DM, HTN presenting with CVA. Pt states that he started having difficulty swallowing earlier in the day. Has baseline difficulty with swallowing from previous stroke characterized by coughing with liquids. Became acutely worse.  BSE and SLE ordered.  Type of Study: Bedside swallow evaluation Diet Prior to this Study: NPO Temperature Spikes Noted: No Respiratory Status: Room air History of Recent Intubation: No Behavior/Cognition: Alert Oral Cavity - Dentition: Adequate natural dentition Self-Feeding Abilities: Able to feed self Patient Positioning: Upright in chair Baseline Vocal Quality: Low vocal intensity;Breathy Volitional Cough: Strong Volitional Swallow: Able to elicit    Oral/Motor/Sensory Function Overall Oral Motor/Sensory Function: Impaired Labial Strength: Reduced (? decreased strength on right) Lingual Symmetry: Within Functional Limits Lingual Strength: Reduced Facial Strength: Reduced (? mildly  decreased on right) Velum: Within Functional Limits Mandible: Within Functional Limits   Ice Chips Ice chips: Not tested   Thin  Liquid Thin Liquid: Not tested    Nectar Thick Nectar Thick Liquid: Impaired Presentation: Cup;Self Fed Oral Phase Impairments: Impaired anterior to posterior transit;Reduced lingual movement/coordination Oral phase functional implications: Prolonged oral transit Pharyngeal Phase Impairments: Suspected delayed Swallow;Multiple swallows;Throat Clearing - Immediate;Cough - Delayed;Wet Vocal Quality Other Comments: chin tuck decreased symtpoms but did not alleviate completely   Honey Thick Honey Thick Liquid: Not tested   Puree Puree: Impaired Presentation: Self Fed;Spoon Oral Phase Impairments: Reduced lingual movement/coordination;Impaired anterior to posterior transit Oral Phase Functional Implications: Prolonged oral transit Pharyngeal Phase Impairments: Suspected delayed Swallow;Multiple swallows;Throat Clearing - Delayed   Solid   GO    Solid: Impaired Presentation: Self Fed Oral Phase Impairments: Reduced lingual movement/coordination;Impaired anterior to posterior transit Oral Phase Functional Implications: Other (comment) (delayed oral transiting) Pharyngeal Phase Impairments: Suspected delayed Swallow;Multiple swallows       [place], [place] [facility] SLP [phone]

## 2013-10-16 NOTE — Evaluation (Signed)
Speech Language Pathology Evaluation Patient Details Name: Clinton [name] MRN: [id number] DOB: 1924-03-31 Today's Date: 10/16/2013 Time: 2409-7353 SLP Time Calculation (min): 24 min  Problem List:  Patient Active Problem List   Diagnosis Date Noted  . Leukocytosis, unspecified 10/16/2013  . Diabetes mellitus 10/15/2013  . CVA (cerebral infarction) 10/15/2013   Past Medical History:  Past Medical History  Diagnosis Date  . Diabetes mellitus without complication   . Stroke   . Hypertension    Past Surgical History:  Past Surgical History  Procedure Laterality Date  . Eye surgery    . Cardiac catheterization     HPI:  80 y.o. year old male with noted prior hx/o CVA with dysarthria deficit, DM, HTN presenting with CVA. Imaging studies positive for pons CVA.  Pt states that he started having difficulty swallowing earlier in the day prior to admit. Has baseline difficulty with swallowing from previous stroke characterized by coughing with liquids. Became acutely worse.  BSE and SLE ordered.    Assessment / Plan / Recommendation Clinical Impression  Pt presents with moderate ? flaccid vs mixed dysarthria characterized by imprecise articulation, decreased respiratory support due to suspected trigeminal, facial, hypoglossal, glossopharyngeal and vagal nerve involvement. Note pt with new pons CVA - ? Premorbid component of other CN involvement.   Pt compensates well for his dysarthria by spelling out words or gesturing when listener does not comprehend.  Language both expressive and receptive intact. Mild presumed to be normal memory loss reported at baseline.  SLP educated spouse/pt to compensation strategies to mitigate his dysarthria - overarticulation found to be most effective clinically.    Will follow to maximize speech/swallowing capabilties.       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  Overall Cognitive Status: Within Advertising copywriter for tasks assessed Arousal/Alertness: Awake/alert Orientation Level: Oriented X4 Attention: Selective Memory: Appears intact (pt and spouse admit to memory loss) Awareness: Appears intact (pt compensating for his dysarthria by spelling out words and using gestures prn) Problem Solving: Appears intact Safety/Judgment: Appears intact    Comprehension  Auditory Comprehension Overall Auditory Comprehension: Appears within functional limits for tasks assessed Yes/No Questions: Not tested Commands: Within Functional Limits Conversation: Complex Visual Recognition/Discrimination Discrimination: Not tested Reading Comprehension Reading Status: Not tested    Expression Expression Primary Mode of Expression: Verbal Verbal Expression Overall Verbal Expression: Appears within functional limits for tasks assessed Initiation: No impairment Level of Generative/Spontaneous Verbalization: Conversation Repetition: No impairment Naming: Not tested Pragmatics: No impairment Written Expression Dominant Hand: Right Written Expression: Not tested   Oral / Motor Oral Motor/Sensory Function Overall Oral Motor/Sensory Function: Impaired Labial Strength: Reduced (? decreased strength on right) Lingual Symmetry: Within Functional Limits Lingual Strength: Reduced Facial Strength: Reduced (? mildly decreased on right) Velum: Within Functional Limits Mandible: Within Functional Limits Motor  Speech Overall Motor Speech: Impaired Respiration: Impaired Level of Impairment: Sentence Phonation: Low vocal intensity;Breathy Articulation: Impaired Level of Impairment: Sentence Intelligibility: Intelligibility reduced Word: 50-74% accurate  Phrase: 50-74% accurate Sentence: 50-74% accurate Conversation: 25-49% accurate Motor Planning: Witnin functional limits Motor Speech Errors: Not applicable Effective Techniques: Over-articulate;Slow rate   [place], [facility] [place] [phone]

## 2013-10-17 LAB — GLUCOSE, CAPILLARY
Glucose-Capillary: 98 mg/dL (ref 70–99)
Glucose-Capillary: 136 mg/dL — ABNORMAL HIGH (ref 70–99)

## 2013-10-17 MED ORDER — ASPIRIN-DIPYRIDAMOLE ER 25-200 MG PO CP12: 1.0000 | ORAL_CAPSULE | Freq: Every day | ORAL | Status: DC

## 2013-10-17 MED ORDER — ASPIRIN EC 81 MG PO TBEC
81.0000 mg | DELAYED_RELEASE_TABLET | Freq: Every day | ORAL | Status: DC
  Administered 2013-10-17: 81 mg via ORAL
  Filled 2013-10-17 (×2): qty 1

## 2013-10-17 MED ORDER — ASPIRIN-DIPYRIDAMOLE ER 25-200 MG PO CP12: 1.0000 | ORAL_CAPSULE | Freq: Two times a day (BID) | ORAL | Status: DC

## 2013-10-17 MED ORDER — RESOURCE THICKENUP CLEAR PO POWD
ORAL | Status: DC | PRN
  Filled 2013-10-17: qty 125

## 2013-10-17 MED ORDER — ASPIRIN-DIPYRIDAMOLE ER 25-200 MG PO CP12
1.0000 | ORAL_CAPSULE | Freq: Every day | ORAL | Status: DC
  Filled 2013-10-17: qty 1

## 2013-10-17 NOTE — Care Management Note (Signed)
    Page 1 of 1   10/17/2013     1:52:53 PM CARE MANAGEMENT NOTE 10/17/2013  Patient:  Clinton [name], Clinton [name]   Account Number:  [id number]  Date Initiated:  10/16/2013  Documentation initiated by:  Lorne Skeens  Subjective/Objective Assessment:   patient was admitted with CVA. Lives at home with spouse.     Action/Plan:   Will follow for discharge needs pending PT/OT evals and physician orders.   Anticipated DC Date:     Anticipated DC Plan:  [place]  CM consult      Choice offered to / List presented to:  C-3 Spouse        HH arranged  [place].   Status of service:  Completed, signed off Medicare Important Message given?  YES (If response is "NO", the following Medicare IM given date fields will be blank) Date Medicare IM given:  10/15/2013 Date Additional Medicare IM given:    Discharge Disposition:  [place]  Per UR Regulation:  Reviewed for med. necessity/level of care/duration of stay  If discussed at [place] of Stay Meetings, dates discussed:    Comments:  10/17/13 [place], MSN, CM- Met with patient and wife to discuss discharge needs. Patient is agreeable to [facility] and wife has chosen [facility].  Clinton [name] with [facility] was notified and has accepted the referral.  Please contact patient's wife Clinton [name] regarding appointments. [phone]

## 2013-10-17 NOTE — Discharge Summary (Signed)
Physician Discharge Summary  Clinton [name] WNI:[id number] DOB: 22-Oct-1923 DOA: 10/15/2013  PCP: Horton Finer, MD  Admit date: 10/15/2013 Discharge date: 10/17/2013  Time spent: 35 minutes  Recommendations for Outpatient Follow-up:  1. Patient admitted for CVA, will have speech therapy with home health services.   Discharge Diagnoses:  Principal Problem:   CVA (cerebral infarction) Active Problems:   Diabetes mellitus   Leukocytosis, unspecified   Discharge Condition: Stable  Diet recommendation: Heart Healthy  Filed Weights   10/16/13 0127  Weight: 53.978 kg (119 lb)    History of present illness:  History of Present Illness:This is a 80 y.o. year old male with noted prior hx/o CVA with dysarthria deficit, DM, HTN presenting with CVA. Pt states that he started having difficulty swallowing earlier in the day. Has baseline difficulty with swallowing from previous stroke. Became acutely worse. Pt's wife took him to [facility]. There R sided facial droop was noted. Pt was then directed to ER as code stroke. On arrival, code stroke protocol activated. Initial head CT negative for any acute abnormalities. Initial NIH stroke scale of 4. Neurology consulted with working dx of likely recurrent acute subcortical cerebral infarction. Pt on babay ASA and plavix prior to evaluation.    Hospital Course:  Patient is a pleasant 80 year old gentleman with prior history of CVA with residual dysarthria, presenting to the emergency room on 10/15/2013 with complaints of worsening dysarthria as well as difficulty swallowing. He presents to the urgent care Center where he was found to have right-sided facial droop and referred to the emergency room. Code CVA was called, was not administer TPA. Initial CT scan of brain without contrast showed no acute intracranial abnormality. He was admitted to the medicine service with neurology consultation. MRI of the brain showed a 2 pontine foci of reduced  diffusion measuring up to 12 mm. Remote right posterior corona radiata infarct is seen with multiple bilateral thalamus and basal ganglia cystic lacunar infarcts that have progressed. Patient was seen and evaluated by Dr. Leonie Man of the stroke team who recommended initiating Aggrenox therapy. He recommended starting Aggrenox once a day in evenings with aspirin 81 mg in a.m. for a two-week period after which aspirin will be discontinued and Aggrenox would be changed to twice daily dosing. Patient showing clinical improvement. Prior to discharge case manager consulted to set patient up with home health speech therapy.   Procedures: Transthoracic echocardiogram performed on 10/16/2013 show an ejection fraction of 55-60%, no wall motion abnormalities.  Consultations:  Neurology  Discharge Exam: Filed Vitals:   10/17/13 1000  BP: 101/56  Pulse: 51  Temp: 97.8 F (36.6 C)  Resp: 16    General: Patient is in no acute distress, dysarthric, awake and alert following commands Cardiovascular: Regular rate rhythm normal S1-S2 no murmurs rubs gallops Respiratory: Clear to auscultation bilaterally no wheezing rhonchi or rales Neurological: Patient having dysarthria, mild right-sided facial droop, 5 of 5 muscle strength to extremity  Discharge Instructions You were cared for by a hospitalist during your hospital stay. If you have any questions about your discharge medications or the care you received while you were in the hospital after you are discharged, you can call the unit and asked to speak with the hospitalist on call if the hospitalist that took care of you is not available. Once you are discharged, your primary care physician will handle any further medical issues. Please note that NO REFILLS for any discharge medications will be authorized once you are  discharged, as it is imperative that you return to your primary care physician (or establish a relationship with a primary care physician if you do  not have one) for your aftercare needs so that they can reassess your need for medications and monitor your lab values.  Discharge Instructions   Call MD for:  difficulty breathing, headache or visual disturbances    Complete by:  As directed      Call MD for:  extreme fatigue    Complete by:  As directed      Call MD for:  persistant dizziness or light-headedness    Complete by:  As directed      Call MD for:  persistant nausea and vomiting    Complete by:  As directed      Call MD for:  severe uncontrolled pain    Complete by:  As directed      Call MD for:  temperature >100.4    Complete by:  As directed      Diet - low sodium heart healthy    Complete by:  As directed      Discharge instructions    Complete by:  As directed   Take Aggrenox once a day in evenings for 2 weeks then twice daily. Take aspirin 81 mg daily in mornings for two weeks then stop once you are taking the aggrenox twice a day     Increase activity slowly    Complete by:  As directed             Medication List    STOP taking these medications       clopidogrel 75 MG tablet  Commonly known as:  PLAVIX      TAKE these medications       aspirin 81 MG tablet  Take 81 mg by mouth daily.     dipyridamole-aspirin 200-25 MG per 12 hr capsule  Commonly known as:  AGGRENOX  Take 1 capsule by mouth at bedtime.     dipyridamole-aspirin 200-25 MG per 12 hr capsule  Commonly known as:  AGGRENOX  Take 1 capsule by mouth 2 (two) times daily.  Start taking on:  10/31/2013     metFORMIN 500 MG tablet  Commonly known as:  GLUCOPHAGE  Take 500 mg by mouth 2 (two) times daily with a meal.       No Known Allergies     Follow-up Information   Follow up with Forbes Cellar, MD In 1 month.   Specialties:  Neurology, Radiology   Contact information:   [place] [address] [phone]       Follow up with Horton Finer, MD In 1 week.   Specialty:  Internal  Medicine   Contact information:   [address], [place] [address] [phone]        The results of significant diagnostics from this hospitalization (including imaging, microbiology, ancillary and laboratory) are listed below for reference.    Significant Diagnostic Studies: Ct Head Wo Contrast  10/15/2013   CLINICAL DATA:  Code stroke. Difficult with speech, confusion, and possible left sided weakness.  EXAM: CT HEAD WITHOUT CONTRAST  TECHNIQUE: Contiguous axial images were obtained from the base of the skull through the vertex without contrast.  COMPARISON:  MR brain 06/18/2007.  FINDINGS: Generalized atrophy. Moderate to severe microvascular ischemic change. No evidence for acute infarction, hemorrhage, mass lesion, hydrocephalus, or extra-axial fluid. Calvarium intact. Vascular calcification. No sinus or  mastoid disease.  IMPRESSION: No acute intracranial abnormality. Atrophy and small vessel disease.  Critical Value/emergent results were called by telephone at the time of interpretation on 10/15/2013 at 6:05 PM to Dr. Joseph Berkshire , who verbally acknowledged these results.   Electronically Signed   By: Rolla Flatten M.D.   On: 10/15/2013 18:05   Mr Brain Wo Contrast  10/16/2013   CLINICAL DATA:  History of stroke, right facial droop bone worsening dysphagia.  EXAM: MRI HEAD WITHOUT CONTRAST  MRA HEAD WITHOUT CONTRAST  TECHNIQUE: Multiplanar, multiecho pulse sequences of the brain and surrounding structures were obtained without intravenous contrast. Angiographic images of the head were obtained using MRA technique without contrast.  COMPARISON:  CT HEAD W/O CM dated 10/15/2013; MR HEAD W/O CM dated 06/18/2007  FINDINGS: MRI HEAD FINDINGS  Two pontine foci of reduced diffusion measuring up to 12 mm, corresponding low ADC values. No susceptibility artifact to suggest acute hemorrhage ; susceptibility artifact in left posterior putaminal associated with remote infarct. Punctate  focus of susceptibility artifact in right thalamus may be new.  Moderate to severe ventriculomegaly, likely on the basis of global parenchymal brain volume loss as there is overall commensurate enlargement of cerebral sulci and cerebellar folia. Remote right posterior corona radiata infarct again seen, with multiple bilateral thalamus and basal ganglia cystic lacunar infarcts, progressed. Moderate white matter changes suggest chronic small vessel ischemic disease. No midline shift or mass effect.  No abnormal extra-axial fluid collections. Status post bilateral ocular lens implants. Bilateral maxillary mucosal retention cyst with mild paranasal sinus mucosal thickening. Trace right greater than left mastoid effusions. No abnormal sellar expansion. No cerebellar tonsillar ectopia. No suspicious calvarial bone marrow signal. Tendons about the odontoid process may reflect CPPD.  MRA HEAD FINDINGS  Anterior circulation: Normal flow related enhancement of the included cervical, the features, cavernous and supra clinoid internal carotid arteries. Normal flow related enhancement of the anterior and middle cerebral arteries, patent anterior communicating artery.  Posterior circulation: Codominant vertebral arteries, with asymmetric flow related enhancement of the vertebral arteries, slightly less robust enhancement of the left vertebral artery which may reflect slow flow. Normal flow related enhancement of the basilar artery and main branch vessels. Normal flow early enhancement of the posterior cerebral arteries with less than 50% narrowing of the right P2/3 segment.  No large vessel occlusion, hemodynamically significant stenosis or aneurysm. No suspicious luminal irregularity.  IMPRESSION: MRI head:  Acute pontine infarcts.  Remote bilateral thalamic, basal ganglia lacunar infarcts with remote right corona radiata infarct previously seen. Remote left basal ganglia hemorrhagic infarct, with punctate focus of  susceptibility artifact in the right thalamus most likely reflecting hemorrhagic infarct.  Moderate white matter changes suggest chronic small vessel ischemic disease.  MRA head: Asymmetric flow related enhancement of left vertebral artery, which could reflect slow flow, and may be better characterized on CT of the head clinically indicated. No large vessel occlusion. Less than 50% narrowing of the right posterior cerebral artery.   Electronically Signed   By: Elon Alas   On: 10/16/2013 02:27   Dg Chest Port 1 View  10/15/2013   CLINICAL DATA:  Trauma.  EXAM: PORTABLE CHEST - 1 VIEW  COMPARISON:  01/22/2011  FINDINGS: Cardiac silhouette is normal in size. Normal mediastinal and hilar contours. Clear lungs. No pleural effusion. No gross pneumothorax on this supine study.  No convincing fracture.  IMPRESSION: No acute cardiopulmonary disease.   Electronically Signed   By: Dedra Skeens.D.  On: 10/15/2013 20:28   Dg Swallowing Func-speech Pathology  10/16/2013   Riley Nearing [place], CCC-SLP     10/16/2013  2:13 PM Objective Swallowing Evaluation: Modified Barium Swallowing Study   Patient Details  Name: TOREN SASSER MRN: [id number] Date of Birth: [birth year]  Today's Date: 10/16/2013 Time: 1300-1330 SLP Time Calculation (min): 30 min  Past Medical History:  Past Medical History  Diagnosis Date  . Diabetes mellitus without complication   . Stroke   . Hypertension    Past Surgical History:  Past Surgical History  Procedure Laterality Date  . Eye surgery    . Cardiac catheterization     HPI:  80 y.o. year old male with noted prior hx/o CVA with dysarthria  deficit, DM, HTN presenting with CVA. Imaging studies positive  for pons CVA.  Pt states that he started having difficulty  swallowing earlier in the day prior to admit. Has baseline  difficulty with swallowing from previous stroke characterized by  coughing with liquids. Became acutely worse.  BSE and SLE  ordered.      Assessment / Plan / Recommendation  Clinical Impression  Dysphagia Diagnosis: Moderate pharyngeal phase dysphagia;Mild  oral phase dysphagia Clinical impression:   Pt presents with a mild oral dysphagia with rocking lingual  movement to slowly propel formed boluses to pharynx without oral  residue.   Oropharyngeal dysphagia is moderate to severe with primary  sensory deficits and mild motor deficits. The pts swallow  initiation is signficantly delayed with nectar and honey boluses  pooling in pyriforms prior the swallow, resulting in gross silent  aspiration of nectar thick liquids as the swallow was initiated.  Only honey teaspoon was sufficiently small and thick enough to  allow pt to fully protect airway during the swallow. There were  also moderate base of tongue weakness and mildly decreased  laryngeal elevation with residuals reamaining in the valleculae  and a small amount above the CP segment, cleared with a cued  second swallow. A chin tuck increased penetration and did not  reduce residue.   Pt is recommended to consume dys 2 (fine chop) with honey thick  liquids via teaspoon with a second swallow. Discussed  restricitive nature of this recommendation with pt and high risk  of aspiration with any thinner textures. Pt verbalized  understanding but was encouraged to discuss it with his family.     Treatment Recommendation  Therapy as outlined in treatment plan below    Diet Recommendation Dysphagia 2 (Fine chop);Honey-thick liquid   Liquid Administration via: Spoon Medication Administration: Whole meds with puree Supervision: Staff to assist with self feeding;Full  supervision/cueing for compensatory strategies Compensations: Slow rate;Small sips/bites;Multiple dry swallows  after each bite/sip Postural Changes and/or Swallow Maneuvers: Seated upright 90  degrees    Other  Recommendations Recommended Consults: MBS Oral Care Recommendations: Oral care BID Other Recommendations: Order thickener from pharmacy   Follow Up Recommendations  Home  health SLP;Outpatient SLP    Frequency and Duration min 2x/week  2 weeks   Pertinent Vitals/Pain NA    SLP Swallow Goals     General Date of Onset: 10/16/13 HPI: 80 y.o. year old male with noted prior hx/o CVA with  dysarthria deficit, DM, HTN presenting with CVA. Imaging studies  positive for pons CVA.  Pt states that he started having  difficulty swallowing earlier in the day prior to admit. Has  baseline difficulty with swallowing from previous stroke  characterized by coughing with liquids. Became acutely worse.  BSE and SLE ordered.  Type of Study: Modified Barium Swallowing Study Reason for Referral: Objectively evaluate swallowing function Diet Prior to this Study: NPO Temperature Spikes Noted: No Respiratory Status: Room air History of Recent Intubation: No Behavior/Cognition: Alert;Cooperative;Pleasant mood Oral Cavity - Dentition: Adequate natural dentition Oral Motor / Sensory Function: Impaired - see Bedside swallow  eval Self-Feeding Abilities: Able to feed self;Needs assist Patient Positioning: Upright in chair Baseline Vocal Quality: Low vocal intensity;Breathy Volitional Cough: Strong Volitional Swallow: Able to elicit Anatomy: Within functional limits    Reason for Referral Objectively evaluate swallowing function   Oral Phase Oral Preparation/Oral Phase Oral Phase: Impaired Oral - Honey Oral - Honey Teaspoon: Lingual pumping;Delayed oral transit Oral - Honey Cup: Lingual pumping;Delayed oral transit Oral - Nectar Oral - Nectar Teaspoon: Lingual pumping;Delayed oral transit Oral - Nectar Cup: Lingual pumping;Delayed oral transit Oral - Solids Oral - Puree: Lingual pumping;Delayed oral transit Oral - Mechanical Soft: Lingual pumping;Delayed oral transit   Pharyngeal Phase Pharyngeal Phase Pharyngeal Phase: Impaired Pharyngeal - Honey Pharyngeal - Honey Teaspoon: Delayed swallow initiation;Reduced  epiglottic inversion;Reduced laryngeal elevation;Reduced tongue  base retraction;Pharyngeal residue -  valleculae;Pharyngeal  residue - cp segment;Compensatory strategies attempted (Comment)  (chin tuck - no improvement) Pharyngeal - Honey Cup: Delayed swallow initiation;Reduced  epiglottic inversion;Reduced laryngeal elevation;Reduced tongue  base retraction;Pharyngeal residue - valleculae;Pharyngeal  residue - cp segment;Compensatory strategies attempted  (Comment);Penetration/Aspiration during swallow Penetration/Aspiration details (honey cup): Material enters  airway, remains ABOVE vocal cords and not ejected out;Material  enters airway, CONTACTS cords and not ejected out Pharyngeal - Nectar Pharyngeal - Nectar Teaspoon: Delayed swallow initiation;Reduced  epiglottic inversion;Reduced laryngeal elevation;Reduced tongue  base retraction;Pharyngeal residue - valleculae;Pharyngeal  residue - cp segment;Compensatory strategies attempted (Comment) Penetration/Aspiration details (nectar teaspoon): Material enters  airway, passes BELOW cords without attempt by patient to eject  out (silent aspiration);Material enters airway, CONTACTS cords  and not ejected out Pharyngeal - Nectar Cup: Delayed swallow initiation;Reduced  epiglottic inversion;Reduced laryngeal elevation;Reduced tongue  base retraction;Pharyngeal residue - valleculae;Pharyngeal  residue - cp segment;Compensatory strategies attempted  (Comment);Penetration/Aspiration during swallow;Significant  aspiration (Amount) (90% of bolus) Penetration/Aspiration details (nectar cup): Material enters  airway, passes BELOW cords without attempt by patient to eject  out (silent aspiration) Pharyngeal - Solids Pharyngeal - Puree: Delayed swallow initiation;Reduced epiglottic  inversion;Reduced laryngeal elevation;Reduced tongue base  retraction;Pharyngeal residue - valleculae;Pharyngeal residue -  cp segment;Compensatory strategies attempted (Comment) Pharyngeal - Mechanical Soft: Delayed swallow initiation;Reduced  epiglottic inversion;Reduced laryngeal elevation;Reduced  tongue  base retraction;Pharyngeal residue - valleculae;Pharyngeal  residue - cp segment;Compensatory strategies attempted (Comment)  Cervical Esophageal Phase    GO    Cervical Esophageal Phase Cervical Esophageal Phase: Impaired Cervical Esophageal Phase - Comment Cervical Esophageal Comment: reduced opening of CP segment, mild  residue above.         Herbie [place], [place] CCC-SLP [place] 10/16/2013, 2:09 PM    Mr Jodene Nam Head/brain Wo Cm  10/16/2013   CLINICAL DATA:  History of stroke, right facial droop bone worsening dysphagia.  EXAM: MRI HEAD WITHOUT CONTRAST  MRA HEAD WITHOUT CONTRAST  TECHNIQUE: Multiplanar, multiecho pulse sequences of the brain and surrounding structures were obtained without intravenous contrast. Angiographic images of the head were obtained using MRA technique without contrast.  COMPARISON:  CT HEAD W/O CM dated 10/15/2013; MR HEAD W/O CM dated 06/18/2007  FINDINGS: MRI HEAD FINDINGS  Two pontine foci of reduced diffusion measuring up to 12 mm, corresponding low ADC values. No susceptibility artifact to  suggest acute hemorrhage ; susceptibility artifact in left posterior putaminal associated with remote infarct. Punctate focus of susceptibility artifact in right thalamus may be new.  Moderate to severe ventriculomegaly, likely on the basis of global parenchymal brain volume loss as there is overall commensurate enlargement of cerebral sulci and cerebellar folia. Remote right posterior corona radiata infarct again seen, with multiple bilateral thalamus and basal ganglia cystic lacunar infarcts, progressed. Moderate white matter changes suggest chronic small vessel ischemic disease. No midline shift or mass effect.  No abnormal extra-axial fluid collections. Status post bilateral ocular lens implants. Bilateral maxillary mucosal retention cyst with mild paranasal sinus mucosal thickening. Trace right greater than left mastoid effusions. No abnormal sellar expansion. No  cerebellar tonsillar ectopia. No suspicious calvarial bone marrow signal. Tendons about the odontoid process may reflect CPPD.  MRA HEAD FINDINGS  Anterior circulation: Normal flow related enhancement of the included cervical, the features, cavernous and supra clinoid internal carotid arteries. Normal flow related enhancement of the anterior and middle cerebral arteries, patent anterior communicating artery.  Posterior circulation: Codominant vertebral arteries, with asymmetric flow related enhancement of the vertebral arteries, slightly less robust enhancement of the left vertebral artery which may reflect slow flow. Normal flow related enhancement of the basilar artery and main branch vessels. Normal flow early enhancement of the posterior cerebral arteries with less than 50% narrowing of the right P2/3 segment.  No large vessel occlusion, hemodynamically significant stenosis or aneurysm. No suspicious luminal irregularity.  IMPRESSION: MRI head:  Acute pontine infarcts.  Remote bilateral thalamic, basal ganglia lacunar infarcts with remote right corona radiata infarct previously seen. Remote left basal ganglia hemorrhagic infarct, with punctate focus of susceptibility artifact in the right thalamus most likely reflecting hemorrhagic infarct.  Moderate white matter changes suggest chronic small vessel ischemic disease.  MRA head: Asymmetric flow related enhancement of left vertebral artery, which could reflect slow flow, and may be better characterized on CT of the head clinically indicated. No large vessel occlusion. Less than 50% narrowing of the right posterior cerebral artery.   Electronically Signed   By: Elon Alas   On: 10/16/2013 02:27    Microbiology: No results found for this or any previous visit (from the past 240 hour(s)).   Labs: Basic Metabolic Panel:  Recent Labs Lab 10/15/13 1747 10/15/13 1800  NA 140 142  K 4.4 4.2  CL 103 99  CO2 25  --   GLUCOSE 215* 212*  BUN 26* 26*   CREATININE 0.84 0.90  CALCIUM 9.6  --    Liver Function Tests:  Recent Labs Lab 10/15/13 1747  AST 24  ALT 15  ALKPHOS 67  BILITOT 0.6  PROT 6.4  ALBUMIN 3.5   No results found for this basename: LIPASE, AMYLASE,  in the last 168 hours No results found for this basename: AMMONIA,  in the last 168 hours CBC:  Recent Labs Lab 10/15/13 1747 10/15/13 1800  WBC 14.2*  --   NEUTROABS 12.5*  --   HGB 12.7* 13.9  HCT 37.7* 41.0  MCV 93.3  --   PLT 147*  --    Cardiac Enzymes: No results found for this basename: CKTOTAL, CKMB, CKMBINDEX, TROPONINI,  in the last 168 hours BNP: BNP (last 3 results) No results found for this basename: PROBNP,  in the last 8760 hours CBG:  Recent Labs Lab 10/16/13 1135 10/16/13 1622 10/16/13 2227 10/17/13 0714 10/17/13 1126  GLUCAP 102* 144* 125* 98 136*  Signed:  Kelvin Cellar  [facility] 10/17/2013, 12:00 PM

## 2013-10-17 NOTE — Progress Notes (Signed)
Patient d/ced, assessments remained unchanged as at now. Home health speech therapy will be following up with him.

## 2013-10-17 NOTE — Progress Notes (Signed)
Speech Language Pathology Treatment: Dysphagia;Cognitive-Linquistic  Patient Details Name: Clinton [name] MRN: [id number] DOB: [birth year] Today's Date: 10/17/2013 Time: 7062-3762 SLP Time Calculation (min): 45 min  Assessment / Plan / Recommendation Clinical Impression  Session focused on education re: dysphagia, MBS results, Mare Ferrari water protocol, use of thickener, purpose of compensation strategies/diet modifications and dysarthria tx.  Pt recalled having MBS yesterday and SLP reviewed results step by step with pt/spouse using diagram.    Pt benefited from moderate verbal/visual cues to follow strategies when observed consuming honey thick via tsp.  No indications of aspiration noted.  Concern for hydration due to honey thick liquids via tsp can be mitigated by using of Frazier water protocol.  Advised pt to implement this program at home.    SLP facilitated treatment further by providing pt with minimal verbal/visual cues to over articulate to mitigate dysarthria.   Pt functionally able to compensate but extended effort is extensive for him.  Advised pt to record himself at home and listen to critique and provide self feedback to maximize speech intelligibility.   Please order follow up SLP ([facility]) to help maximize pt's speech/swallowing.  Family/pt given opportunities to ask questions, answers provided.  Pt would benefit from consideration for repeat MBS in future.  Thanks for allowing me to help in caring for this pleasant pt/spouse.          HPI HPI: 80 y.o. year old male with noted prior hx/o CVA with dysarthria deficit, DM, HTN presenting with CVA. Imaging studies positive for pons CVA.  Pt states that he started having difficulty swallowing earlier in the day prior to admit. Has baseline difficulty with swallowing from previous stroke characterized by coughing with liquids. Became acutely worse.  BSE, MBS and SLE completed.  SLP visit for speech/dysphagia treatment.     Pertinent  Vitals Afebrile, decreased  SLP Plan  Continue with current plan of care    Recommendations Diet recommendations: Dysphagia 2 (fine chop);Honey-thick liquid (frazier water protocol) Liquids provided via: Teaspoon Medication Administration: Whole meds with puree Supervision: Staff to assist with self feeding;Full supervision/cueing for compensatory strategies Compensations: Slow rate;Small sips/bites;Multiple dry swallows after each bite/sip Postural Changes and/or Swallow Maneuvers: Seated upright 90 degrees;Upright 30-60 min after meal              Oral Care Recommendations: Oral care BID Follow up Recommendations: Home health SLP;Outpatient SLP Plan: Continue with current plan of care    [place], [place] [facility] SLP [phone]

## 2013-10-17 NOTE — Progress Notes (Signed)
Stroke Team Progress Note  HISTORY Clinton [name] is an 80 y.o. male with a history of diabetes mellitus and small stroke 3 years ago presenting with new onset speech and swallowing difficulty starting at 10 AM today 10/15/2013. Patient has been taking aspirin and Plavix daily. CT scan of his head showed no acute intracranial abnormality. Atrophy and small vessel disease were noted. NIH stroke score was 4. Patient was not administered TPA secondary to beyond time window for treatment consideration; mild deficits only.  He was admitted for further evaluation and treatment.  SUBJECTIVE Wife at bedside. Patient sitting up in bed in room.  OBJECTIVE Most recent Vital Signs: Filed Vitals:   10/16/13 1740 10/16/13 2026 10/17/13 0132 10/17/13 0514  BP: 110/54 155/70 133/67 116/72  Pulse: 57 53 50 52  Temp: 97.7 F (36.5 C) 98.1 F (36.7 C) 98 F (36.7 C) 97.7 F (36.5 C)  TempSrc: Oral Oral Oral Oral  Resp: 18 20 18 16   Height:      Weight:      SpO2: 100% 100% 98% 98%   CBG (last 3)   Recent Labs  10/16/13 1622 10/16/13 2227 10/17/13 0714  GLUCAP 144* 125* 98    IV Fluid Intake:     MEDICATIONS  . antiseptic oral rinse  15 mL Mouth Rinse BID  . aspirin  300 mg Rectal Daily  . enoxaparin (LOVENOX) injection  30 mg Subcutaneous Q24H   PRN:  senna-docusate  Diet:  Dysphagia 2 honey thick liquids DVT Prophylaxis:  Lovenox 30 mg sq daily  CLINICALLY SIGNIFICANT STUDIES Basic Metabolic Panel:   Recent Labs Lab 10/15/13 1747 10/15/13 1800  NA 140 142  K 4.4 4.2  CL 103 99  CO2 25  --   GLUCOSE 215* 212*  BUN 26* 26*  CREATININE 0.84 0.90  CALCIUM 9.6  --    Liver Function Tests:   Recent Labs Lab 10/15/13 1747  AST 24  ALT 15  ALKPHOS 67  BILITOT 0.6  PROT 6.4  ALBUMIN 3.5   CBC:   Recent Labs Lab 10/15/13 1747 10/15/13 1800  WBC 14.2*  --   NEUTROABS 12.5*  --   HGB 12.7* 13.9  HCT 37.7* 41.0  MCV 93.3  --   PLT 147*  --    Coagulation:    Recent Labs Lab 10/15/13 1747  LABPROT 13.0  INR 1.00   Cardiac Enzymes: No results found for this basename: CKTOTAL, CKMB, CKMBINDEX, TROPONINI,  in the last 168 hours Urinalysis:   Recent Labs Lab 10/15/13 1913  COLORURINE YELLOW  LABSPEC 1.024  PHURINE 6.0  GLUCOSEU 500*  HGBUR TRACE*  BILIRUBINUR NEGATIVE  KETONESUR 15*  PROTEINUR NEGATIVE  UROBILINOGEN 0.2  NITRITE NEGATIVE  LEUKOCYTESUR NEGATIVE   Lipid Panel    Component Value Date/Time   CHOL 146 10/16/2013 0643   TRIG 62 10/16/2013 0643   HDL 73 10/16/2013 0643   CHOLHDL 2.0 10/16/2013 0643   VLDL 12 10/16/2013 0643   LDLCALC 61 10/16/2013 0643   HgbA1C  Lab Results  Component Value Date   HGBA1C 6.0* 10/16/2013    Urine Drug Screen:     Component Value Date/Time   LABOPIA NONE DETECTED 10/15/2013 1913   COCAINSCRNUR NONE DETECTED 10/15/2013 1913   LABBENZ NONE DETECTED 10/15/2013 1913   AMPHETMU NONE DETECTED 10/15/2013 1913   THCU NONE DETECTED 10/15/2013 1913   LABBARB NONE DETECTED 10/15/2013 1913    Alcohol Level:   Recent Labs Lab 10/15/13 1747  ETH <11   CT of the brain  10/15/2013    No acute intracranial abnormality. Atrophy and small vessel disease.    MRI of the brain  10/16/2013   Acute pontine infarcts.  Remote bilateral thalamic, basal ganglia lacunar infarcts with remote right corona radiata infarct previously seen. Remote left basal ganglia hemorrhagic infarct, with punctate focus of susceptibility artifact in the right thalamus most likely reflecting hemorrhagic infarct.  Moderate white matter changes suggest chronic small vessel ischemic disease.    MRA of the brain  10/16/2013   Asymmetric flow related enhancement of left vertebral artery, which could reflect slow flow, and may be better characterized on CT of the head clinically indicated. No large vessel occlusion. Less than 50% narrowing of the right posterior cerebral artery.   2D Echocardiogram  EF 55-60% with no source of embolus.   Carotid  Doppler  No evidence of hemodynamically significant internal carotid artery stenosis. Vertebral artery flow is antegrade.   CXR  10/15/2013   No acute cardiopulmonary disease.   EKG  normal sinus rhythm. For complete results please see formal report.   Therapy Recommendations HH or OP ST, No PT  Physical Exam   Frail elderly caucasian male not in distress.Awake alert. Afebrile. Head is nontraumatic. Neck is supple without bruit. Hearing is normal. Cardiac exam no murmur or gallop. Lungs are clear to auscultation. Distal pulses are well felt. Neurological Exam : Awake alert oriented x2. Pseudobulbar dysarthria but can be understood with some difficulty. Fundi are not visualized. Vision acuity seems adequate. Blinks to threat bilaterally. Mild bifacial weakness right greater than left. Tongue midline. Portal system exam reveals mild right-sided weakness with right grip and intrinsic hand muscle weakness. Fine finger movements are diminished in the right. Orbits left-to-right upper extremity Mild right hip flexor ankle a subtle weakness. Deep tendon reflexes are brisk and symmetric left than right. Both plantars upgoing. There is mild paratonia bilaterally. Sensation appears preserved. Gait was not tested.  ASSESSMENT [name] is a 80 y.o. male presenting with new-onset speech and swallowing difficulty. Imaging confirms acute pontine infarcts in the setting of old bilateral thalamic, basal ganglia lacunar infarcts and old right corona radiata and left basal ganglia hemorrhagic infarct. Current infarcts felt to be thrombotic secondary to small vessel disease.  On aspirin 81 mg orally every day and clopidogrel 75 mg orally every day prior to admission. Now on clopidogrel 75 mg orally every day and aspirin suppository 300 mg daily for secondary stroke prevention. Patient with resultant right hemiparesis, pseudobublar dysarthria, brisk jaw jerks, dysphagia. Stroke work up  completed.  hypertension Diabetes, HgbA1c 6.0, goal < 7.0 Hx stroke with dysarthria  LDL 61  Hospital day # 2  TREATMENT/PLAN  Given stroke on aspirin and plavix, have changed to dipyridamole SR 250 mg/aspirin 25 mg orally twice a day for secondary stroke prevention. To prevent headache, most common side effect of Aggrenox, will start Aggrenox q hs x 2 weeks then increase Aggrenox to bid.  Until then, aspirin 81 mg q am x 2 weeks, then discontinue. May take Tylenol 650 mg 1 hr prior to Aggrenox for the first week, then discontinue.   OP or HH ST  No further stroke workup indicated. Patient has a 10-15% risk of having another stroke over the next year, the highest risk is within 2 weeks of the most recent stroke/TIA (risk of having a stroke following a stroke or TIA is the same). Ongoing risk factor control by Primary  Care Physician Stroke Service will sign off. Please call should any needs arise.  Follow up with Dr. Leonie Man, [facility], in 1 month.  SIGNED Burnetta Sabin, MSN, RN, ANVP-BC, ANP-BC, GNP-BC [facility] Pager: [phone] 10/17/2013 9:23 AM   I have personally obtained a history, examined the patient, evaluated imaging results, and formulated the assessment and plan of care. I agree with the above.  Antony Contras, MD   To contact Stroke Continuity provider, please refer to [email or web address] After hours, contact General Neurology

## 2013-11-19 ENCOUNTER — Ambulatory Visit: Payer: Medicare Other

## 2013-11-19 ENCOUNTER — Ambulatory Visit: Payer: Medicare Other | Attending: Internal Medicine | Admitting: Physical Therapy

## 2013-11-19 DIAGNOSIS — R269 Unspecified abnormalities of gait and mobility: Secondary | ICD-10-CM | POA: Insufficient documentation

## 2013-11-19 DIAGNOSIS — Z5189 Encounter for other specified aftercare: Secondary | ICD-10-CM | POA: Insufficient documentation

## 2013-11-19 DIAGNOSIS — R490 Dysphonia: Secondary | ICD-10-CM | POA: Insufficient documentation

## 2013-11-19 DIAGNOSIS — I69991 Dysphagia following unspecified cerebrovascular disease: Secondary | ICD-10-CM | POA: Diagnosis not present

## 2013-11-19 DIAGNOSIS — M1990 Unspecified osteoarthritis, unspecified site: Secondary | ICD-10-CM | POA: Diagnosis not present

## 2013-11-19 DIAGNOSIS — I69998 Other sequelae following unspecified cerebrovascular disease: Secondary | ICD-10-CM | POA: Insufficient documentation

## 2013-11-19 DIAGNOSIS — I69922 Dysarthria following unspecified cerebrovascular disease: Secondary | ICD-10-CM | POA: Insufficient documentation

## 2013-11-19 DIAGNOSIS — C61 Malignant neoplasm of prostate: Secondary | ICD-10-CM | POA: Insufficient documentation

## 2013-11-19 DIAGNOSIS — R1310 Dysphagia, unspecified: Secondary | ICD-10-CM | POA: Diagnosis not present

## 2013-11-19 DIAGNOSIS — R5381 Other malaise: Secondary | ICD-10-CM | POA: Diagnosis not present

## 2013-11-19 DIAGNOSIS — M6281 Muscle weakness (generalized): Secondary | ICD-10-CM | POA: Diagnosis not present

## 2013-11-19 DIAGNOSIS — E119 Type 2 diabetes mellitus without complications: Secondary | ICD-10-CM | POA: Insufficient documentation

## 2013-11-22 ENCOUNTER — Ambulatory Visit: Payer: Medicare Other | Admitting: Speech Pathology

## 2013-11-22 DIAGNOSIS — Z5189 Encounter for other specified aftercare: Secondary | ICD-10-CM | POA: Diagnosis not present

## 2013-11-26 ENCOUNTER — Encounter: Payer: Self-pay | Admitting: Neurology

## 2013-11-26 ENCOUNTER — Encounter (INDEPENDENT_AMBULATORY_CARE_PROVIDER_SITE_OTHER): Payer: Self-pay

## 2013-11-26 ENCOUNTER — Ambulatory Visit (INDEPENDENT_AMBULATORY_CARE_PROVIDER_SITE_OTHER): Payer: Medicare Other | Admitting: Neurology

## 2013-11-26 VITALS — BP 111/60 | HR 91 | Ht 62.5 in | Wt 118.2 lb

## 2013-11-26 DIAGNOSIS — I639 Cerebral infarction, unspecified: Secondary | ICD-10-CM | POA: Insufficient documentation

## 2013-11-26 DIAGNOSIS — I6350 Cerebral infarction due to unspecified occlusion or stenosis of unspecified cerebral artery: Secondary | ICD-10-CM

## 2013-11-26 MED ORDER — ATORVASTATIN CALCIUM 10 MG PO TABS: 10.0000 mg | ORAL_TABLET | Freq: Every day | ORAL | Status: DC

## 2013-11-26 NOTE — Progress Notes (Signed)
Today we had the pleasure of seeing Clinton [name] in follow-up at our Neurology Clinic.  Interval history was obtained from pt and his wife.   History Summary 80 yo male with PMH of DM and CVA 5 years ago presented for hospital follow up. He had stroke about 5 years ago with sudden onset speech and swallowing difficulty, was on ASA and metformin at that time. He was treated with ASA and plavix and his speech became normal over the years but still has mild swallow problems. On 10/15/13, he again had acute onset slurry speech and worsening of his swallow difficulty. He went to urgent care and right facial droop noted and he was sent to [facility] hospital for code stroke. CT no acute abnormality but MRI showed pontine stroke. CUS and 2D echo unremarkable. A1C 6.0 and LDL 61. He was discharged with aggrenox.   Interval History After discharge home, he has been followed with speech pathology and he still has some difficult with water but doing better, and still not back to baseline yet. He denies any weakness, numbness, vision changes. Walk with walker, no fall at home. Continue to take aggrenox bid and metformin bid. Denies any HTN, HLD, or smoking hx.  During the last admission, his A1C was 6.1 but 3 glucose check were all above 200s. Not sure if his sugar was really in good control. Pt never checked his glucose level at home, will need check glucose levels at home.  Current Outpatient Prescriptions on File Prior to Visit  Medication Sig Dispense Refill  . dipyridamole-aspirin (AGGRENOX) 200-25 MG per 12 hr capsule Take 1 capsule by mouth 2 (two) times daily.  60 capsule  1  . metFORMIN (GLUCOPHAGE) 500 MG tablet Take 500 mg by mouth 2 (two) times daily with a meal.       No current facility-administered medications on file prior to visit.    The following represents the patient's updated allergies and side effects list: No Known Allergies  Labs since last visit of relevance include the following: Results  for orders placed during the hospital encounter of 10/15/13  ETHANOL      Result Value Ref Range   Alcohol, Ethyl (B) <11  0 - 11 mg/dL  PROTIME-INR      Result Value Ref Range   Prothrombin Time 13.0  11.6 - 15.2 seconds   INR 1.00  0.00 - 1.49  APTT      Result Value Ref Range   aPTT 30  24 - 37 seconds  CBC      Result Value Ref Range   WBC 14.2 (*) 4.0 - 10.5 K/uL   RBC 4.04 (*) 4.22 - 5.81 MIL/uL   Hemoglobin 12.7 (*) 13.0 - 17.0 g/dL   HCT 37.7 (*) 39.0 - 52.0 %   MCV 93.3  78.0 - 100.0 fL   MCH 31.4  26.0 - 34.0 pg   MCHC 33.7  30.0 - 36.0 g/dL   RDW 12.9  11.5 - 15.5 %   Platelets 147 (*) 150 - 400 K/uL  DIFFERENTIAL      Result Value Ref Range   Neutrophils Relative % 88 (*) 43 - 77 %   Neutro Abs 12.5 (*) 1.7 - 7.7 K/uL   Lymphocytes Relative 7 (*) 12 - 46 %   Lymphs Abs 1.1  0.7 - 4.0 K/uL   Monocytes Relative 5  3 - 12 %   Monocytes Absolute 0.7  0.1 - 1.0 K/uL   Eosinophils  Relative 0  0 - 5 %   Eosinophils Absolute 0.0  0.0 - 0.7 K/uL   Basophils Relative 0  0 - 1 %   Basophils Absolute 0.0  0.0 - 0.1 K/uL  COMPREHENSIVE METABOLIC PANEL      Result Value Ref Range   Sodium 140  137 - 147 mEq/L   Potassium 4.4  3.7 - 5.3 mEq/L   Chloride 103  96 - 112 mEq/L   CO2 25  19 - 32 mEq/L   Glucose, Bld 215 (*) 70 - 99 mg/dL   BUN 26 (*) 6 - 23 mg/dL   Creatinine, Ser 0.84  0.50 - 1.35 mg/dL   Calcium 9.6  8.4 - 10.5 mg/dL   Total Protein 6.4  6.0 - 8.3 g/dL   Albumin 3.5  3.5 - 5.2 g/dL   AST 24  0 - 37 U/L   ALT 15  0 - 53 U/L   Alkaline Phosphatase 67  39 - 117 U/L   Total Bilirubin 0.6  0.3 - 1.2 mg/dL   GFR calc non Af Amer 76 (*) >90 mL/min   GFR calc Af Amer 89 (*) >90 mL/min  URINE RAPID DRUG SCREEN (HOSP PERFORMED)      Result Value Ref Range   Opiates NONE DETECTED  NONE DETECTED   Cocaine NONE DETECTED  NONE DETECTED   Benzodiazepines NONE DETECTED  NONE DETECTED   Amphetamines NONE DETECTED  NONE DETECTED   Tetrahydrocannabinol NONE DETECTED   NONE DETECTED   Barbiturates NONE DETECTED  NONE DETECTED  URINALYSIS, ROUTINE W REFLEX MICROSCOPIC      Result Value Ref Range   Color, Urine YELLOW  YELLOW   APPearance CLEAR  CLEAR   Specific Gravity, Urine 1.024  1.005 - 1.030   pH 6.0  5.0 - 8.0   Glucose, UA 500 (*) NEGATIVE mg/dL   Hgb urine dipstick TRACE (*) NEGATIVE   Bilirubin Urine NEGATIVE  NEGATIVE   Ketones, ur 15 (*) NEGATIVE mg/dL   Protein, ur NEGATIVE  NEGATIVE mg/dL   Urobilinogen, UA 0.2  0.0 - 1.0 mg/dL   Nitrite NEGATIVE  NEGATIVE   Leukocytes, UA NEGATIVE  NEGATIVE  HEMOGLOBIN A1C      Result Value Ref Range   Hemoglobin A1C 6.0 (*) <5.7 %   Mean Plasma Glucose 126 (*) <117 mg/dL  LIPID PANEL      Result Value Ref Range   Cholesterol 146  0 - 200 mg/dL   Triglycerides 62  <150 mg/dL   HDL 73  >39 mg/dL   Total CHOL/HDL Ratio 2.0     VLDL 12  0 - 40 mg/dL   LDL Cholesterol 61  0 - 99 mg/dL  URINE MICROSCOPIC-ADD ON      Result Value Ref Range   Squamous Epithelial / LPF RARE  RARE   WBC, UA 0-2  <3 WBC/hpf   RBC / HPF 0-2  <3 RBC/hpf   Bacteria, UA RARE  RARE  GLUCOSE, CAPILLARY      Result Value Ref Range   Glucose-Capillary 117 (*) 70 - 99 mg/dL  GLUCOSE, CAPILLARY      Result Value Ref Range   Glucose-Capillary 118 (*) 70 - 99 mg/dL  GLUCOSE, CAPILLARY      Result Value Ref Range   Glucose-Capillary 102 (*) 70 - 99 mg/dL  GLUCOSE, CAPILLARY      Result Value Ref Range   Glucose-Capillary 144 (*) 70 - 99 mg/dL  GLUCOSE, CAPILLARY  Result Value Ref Range   Glucose-Capillary 125 (*) 70 - 99 mg/dL   Comment 1 Notify RN     Comment 2 Documented in Chart    GLUCOSE, CAPILLARY      Result Value Ref Range   Glucose-Capillary 98  70 - 99 mg/dL   Comment 1 Notify RN     Comment 2 Documented in Chart    GLUCOSE, CAPILLARY      Result Value Ref Range   Glucose-Capillary 136 (*) 70 - 99 mg/dL   Comment 1 Notify RN     Comment 2 Documented in Chart    I-STAT CHEM 8, ED      Result Value  Ref Range   Sodium 142  137 - 147 mEq/L   Potassium 4.2  3.7 - 5.3 mEq/L   Chloride 99  96 - 112 mEq/L   BUN 26 (*) 6 - 23 mg/dL   Creatinine, Ser 0.90  0.50 - 1.35 mg/dL   Glucose, Bld 212 (*) 70 - 99 mg/dL   Calcium, Ion 1.31 (*) 1.13 - 1.30 mmol/L   TCO2 25  0 - 100 mmol/L   Hemoglobin 13.9  13.0 - 17.0 g/dL   HCT 41.0  39.0 - 52.0 %  I-STAT TROPOININ, ED      Result Value Ref Range   Troponin i, poc 0.01  0.00 - 0.08 ng/mL   Comment 3           CBG MONITORING, ED      Result Value Ref Range   Glucose-Capillary 189 (*) 70 - 99 mg/dL   Comment 1 Notify RN     Comment 2 Documented in Chart      The neurologically relevant items on the patient's problem list were reviewed on today's visit.  Review of Systems  Neurological: Positive for speech change (and swallow difficulty. walk with walker).  All other systems reviewed and are negative.  Neurologic Examination  A problem focused neurological exam (12 or more points of the single system neurologic examination, vital signs counts as 1 point, cranial nerves count for 8 points) was performed.  Blood pressure 111/60, pulse 91, height 5' 2.5" (1.588 m), weight 118 lb 3.2 oz (53.615 kg).  General - Well nourished, well developed, in no apparent distress.  Ophthalmologic - Sharp disc margins OU.  Cardiovascular - Regular rate and rhythm with no murmur.  Mental Status -  Level of arousal and orientation to time, place, and person were intact. Language including expression, naming, repetition, comprehension was assessed and found intact.  Cranial Nerves II - XII - II - Vision intact OU. III, IV, VI - Extraocular movements intact. V - Facial sensation intact bilaterally. VII - Facial movement intact bilaterally. VIII - Hearing & vestibular intact bilaterally. X - Palate elevates symmetrically, mild dysarthria. XI - Chin turning & shoulder shrug intact bilaterally. XII - Tongue protrusion intact.  Motor Strength - The  patient's strength was normal in all extremities and pronator drift was absent.  Bulk was decreased in all extremities and fasciculations were absent.   Motor Tone - Muscle tone was assessed at the neck and appendages and was normal.  Reflexes - The patient's reflexes were decreased in all extremities and he had no pathological reflexes.  Sensory - Light touch, temperature/pinprick were assessed and were normal.    Coordination - The patient had normal movements in the hands and feet with no ataxia or dysmetria.  Tremor was absent.  Gait and Station -  walk with walker without difficulites.  Data reviewed: CT of the brain 10/15/2013 No acute intracranial abnormality. Atrophy and small vessel disease.   MRI of the brain 10/16/2013 Acute pontine infarcts. Remote bilateral thalamic, basal ganglia lacunar infarcts with remote right corona radiata infarct previously seen. Remote left basal ganglia hemorrhagic infarct, with punctate focus of susceptibility artifact in the right thalamus most likely reflecting hemorrhagic infarct. Moderate white matter changes suggest chronic small vessel ischemic disease.   MRA of the brain 10/16/2013 Asymmetric flow related enhancement of left vertebral artery, which could reflect slow flow, and may be better characterized on CT of the head clinically indicated. No large vessel occlusion. Less than 50% narrowing of the right posterior cerebral artery.   2D Echocardiogram 10/16/13  - Left ventricle: The cavity size was normal. Systolic function was normal. The estimated ejection fraction was in the range of 55% to 60%. Wall motion was normal; there were no regional wall motion abnormalities.  - Atrial septum: No defect or patent foramen ovale was identified.  Carotid Doppler 10/16/13  - The vertebral arteries appear patent with antegrade flow. - Findings consistent with 1- 39 percent stenosis involving the right internal carotid artery and the left internal carotid  artery.  Assessment: As you may recall, he is a 80 y.o. Caucasian male with hx of DM and prior CVA followed up in clinic today for stroke. He had pontine stroke on 10/15/13 and confirmed on MRI. Stroke w/u include CUS, 2D echo, A1C and LDL were unremarkable. MRI showed chronic lacunar strokes. His stroke most likely small vessel stroke, risk factors are age, and DM. Although his A1C 6.0 during hospitalization, but his glucose level checked in hospital were all more than 200s. Therefore, his glucose level as home should be monitored. Will recommend PCP to arrange home glucose monitoring. He will need continued swallow follow up and put on low dose lipitor for stroke prevention.  Diagnoses from this visit: CVA (cerebral vascular accident) - Plan: atorvastatin (LIPITOR) 10 MG tablet  Plan:  - Continue aggrenox and metformin - add low dose lipitor for stroke prevention - recommend pt to request home glucose monitoring device when he sees his PCP next month. - continue to work with Electrical engineer for swallow - RTC in 3 months.  No orders of the defined types were placed in this encounter.   Patient Instructions  1. Continue work with Electrical engineer for swallow  2. Next time see your PCP, asking for home glucose monitoring device to monitor at home to make sure the glucose in good control. Goal is less than 140 before meals 3. Add low dose of lipitor 10mg  at night for storke prevention.  4. Continue aggrenox and metformin 5. Follow up in about 3 months.   The patient will return to our clinic in 3 months. Thank you once again for allowing [place] to participate in the care of this patient.

## 2013-11-26 NOTE — Patient Instructions (Signed)
1. Continue work with Electrical engineer for swallow  2. Next time see your PCP, asking for home glucose monitoring device to monitor at home to make sure the glucose in good control. Goal is less than 140 before meals 3. Add low dose of lipitor 10mg  at night for storke prevention.  4. Continue aggrenox and metformin 5. Follow up in about 3 months.

## 2013-11-29 ENCOUNTER — Ambulatory Visit: Payer: Medicare Other | Admitting: Speech Pathology

## 2013-11-29 ENCOUNTER — Ambulatory Visit: Payer: Medicare Other | Admitting: Physical Therapy

## 2013-11-29 DIAGNOSIS — Z5189 Encounter for other specified aftercare: Secondary | ICD-10-CM | POA: Diagnosis not present

## 2013-11-30 ENCOUNTER — Ambulatory Visit: Payer: Medicare Other | Admitting: Physical Therapy

## 2013-11-30 ENCOUNTER — Ambulatory Visit: Payer: Medicare Other

## 2013-11-30 DIAGNOSIS — Z5189 Encounter for other specified aftercare: Secondary | ICD-10-CM | POA: Diagnosis not present

## 2013-12-04 ENCOUNTER — Ambulatory Visit: Payer: Medicare Other

## 2013-12-04 DIAGNOSIS — Z5189 Encounter for other specified aftercare: Secondary | ICD-10-CM | POA: Diagnosis not present

## 2013-12-06 ENCOUNTER — Ambulatory Visit: Payer: Medicare Other | Admitting: Physical Therapy

## 2013-12-07 ENCOUNTER — Ambulatory Visit: Payer: Medicare Other | Admitting: Physical Therapy

## 2013-12-07 ENCOUNTER — Ambulatory Visit: Payer: Medicare Other

## 2013-12-07 DIAGNOSIS — Z5189 Encounter for other specified aftercare: Secondary | ICD-10-CM | POA: Diagnosis not present

## 2013-12-11 ENCOUNTER — Ambulatory Visit: Payer: Medicare Other | Admitting: Physical Therapy

## 2013-12-11 DIAGNOSIS — Z5189 Encounter for other specified aftercare: Secondary | ICD-10-CM | POA: Diagnosis not present

## 2013-12-13 ENCOUNTER — Ambulatory Visit: Payer: Medicare Other | Admitting: Physical Therapy

## 2013-12-13 DIAGNOSIS — Z5189 Encounter for other specified aftercare: Secondary | ICD-10-CM | POA: Diagnosis not present

## 2013-12-18 ENCOUNTER — Ambulatory Visit: Payer: Medicare Other | Attending: Internal Medicine

## 2013-12-18 ENCOUNTER — Ambulatory Visit: Payer: Medicare Other | Admitting: Physical Therapy

## 2013-12-18 DIAGNOSIS — R490 Dysphonia: Secondary | ICD-10-CM | POA: Insufficient documentation

## 2013-12-18 DIAGNOSIS — R269 Unspecified abnormalities of gait and mobility: Secondary | ICD-10-CM | POA: Diagnosis not present

## 2013-12-18 DIAGNOSIS — I69991 Dysphagia following unspecified cerebrovascular disease: Secondary | ICD-10-CM | POA: Diagnosis not present

## 2013-12-18 DIAGNOSIS — Z5189 Encounter for other specified aftercare: Secondary | ICD-10-CM | POA: Insufficient documentation

## 2013-12-18 DIAGNOSIS — M6281 Muscle weakness (generalized): Secondary | ICD-10-CM | POA: Diagnosis not present

## 2013-12-18 DIAGNOSIS — M1990 Unspecified osteoarthritis, unspecified site: Secondary | ICD-10-CM | POA: Insufficient documentation

## 2013-12-18 DIAGNOSIS — R1310 Dysphagia, unspecified: Secondary | ICD-10-CM | POA: Insufficient documentation

## 2013-12-18 DIAGNOSIS — E119 Type 2 diabetes mellitus without complications: Secondary | ICD-10-CM | POA: Insufficient documentation

## 2013-12-18 DIAGNOSIS — I69922 Dysarthria following unspecified cerebrovascular disease: Secondary | ICD-10-CM | POA: Insufficient documentation

## 2013-12-18 DIAGNOSIS — R5381 Other malaise: Secondary | ICD-10-CM | POA: Insufficient documentation

## 2013-12-18 DIAGNOSIS — I69998 Other sequelae following unspecified cerebrovascular disease: Secondary | ICD-10-CM | POA: Diagnosis not present

## 2013-12-18 DIAGNOSIS — C61 Malignant neoplasm of prostate: Secondary | ICD-10-CM | POA: Insufficient documentation

## 2013-12-20 ENCOUNTER — Ambulatory Visit: Payer: Medicare Other | Admitting: Podiatrist

## 2013-12-20 ENCOUNTER — Ambulatory Visit: Payer: Medicare Other

## 2013-12-20 ENCOUNTER — Ambulatory Visit: Payer: Medicare Other | Admitting: Physical Therapy

## 2013-12-20 DIAGNOSIS — Z5189 Encounter for other specified aftercare: Secondary | ICD-10-CM | POA: Diagnosis not present

## 2013-12-25 ENCOUNTER — Ambulatory Visit: Payer: Medicare Other | Admitting: Speech Pathology

## 2013-12-25 ENCOUNTER — Ambulatory Visit: Payer: Medicare Other | Admitting: Physical Therapy

## 2013-12-25 ENCOUNTER — Other Ambulatory Visit (HOSPITAL_COMMUNITY): Payer: Self-pay | Admitting: Internal Medicine

## 2013-12-25 DIAGNOSIS — R1310 Dysphagia, unspecified: Secondary | ICD-10-CM

## 2013-12-25 DIAGNOSIS — Z5189 Encounter for other specified aftercare: Secondary | ICD-10-CM | POA: Diagnosis not present

## 2013-12-25 DIAGNOSIS — I6322 Cerebral infarction due to unspecified occlusion or stenosis of basilar arteries: Secondary | ICD-10-CM

## 2013-12-27 ENCOUNTER — Ambulatory Visit: Payer: Medicare Other

## 2013-12-27 ENCOUNTER — Encounter: Payer: Medicare Other | Admitting: Speech Pathology

## 2013-12-27 DIAGNOSIS — Z5189 Encounter for other specified aftercare: Secondary | ICD-10-CM | POA: Diagnosis not present

## 2013-12-28 ENCOUNTER — Ambulatory Visit (INDEPENDENT_AMBULATORY_CARE_PROVIDER_SITE_OTHER): Payer: Medicare Other | Admitting: Podiatrist

## 2013-12-28 ENCOUNTER — Encounter: Payer: Self-pay | Admitting: Podiatrist

## 2013-12-28 DIAGNOSIS — M79673 Pain in unspecified foot: Secondary | ICD-10-CM

## 2013-12-28 DIAGNOSIS — B351 Tinea unguium: Secondary | ICD-10-CM

## 2013-12-28 DIAGNOSIS — M79609 Pain in unspecified limb: Secondary | ICD-10-CM

## 2013-12-28 NOTE — Patient Instructions (Signed)
Diabetes and Foot Care Diabetes may cause you to have problems because of poor blood supply (circulation) to your feet and legs. This may cause the skin on your feet to become thinner, break easier, and heal more slowly. Your skin may become dry, and the skin may peel and crack. You may also have nerve damage in your legs and feet causing decreased feeling in them. You may not notice minor injuries to your feet that could lead to infections or more serious problems. Taking care of your feet is one of the most important things you can do for yourself.  HOME CARE INSTRUCTIONS  Wear shoes at all times, even in the house. Do not go barefoot. Bare feet are easily injured.  Check your feet daily for blisters, cuts, and redness. If you cannot see the bottom of your feet, use a mirror or ask someone for help.  Wash your feet with warm water (do not use hot water) and mild soap. Then pat your feet and the areas between your toes until they are completely dry. Do not soak your feet as this can dry your skin.  Apply a moisturizing lotion or petroleum jelly (that does not contain alcohol and is unscented) to the skin on your feet and to dry, brittle toenails. Do not apply lotion between your toes.  Trim your toenails straight across. Do not dig under them or around the cuticle. File the edges of your nails with an emery board or nail file.  Do not cut corns or calluses or try to remove them with medicine.  Wear clean socks or stockings every day. Make sure they are not too tight. Do not wear knee-high stockings since they may decrease blood flow to your legs.  Wear shoes that fit properly and have enough cushioning. To break in new shoes, wear them for just a few hours a day. This prevents you from injuring your feet. Always look in your shoes before you put them on to be sure there are no objects inside.  Do not cross your legs. This may decrease the blood flow to your feet.  If you find a minor scrape,  cut, or break in the skin on your feet, keep it and the skin around it clean and dry. These areas may be cleansed with mild soap and water. Do not cleanse the area with peroxide, alcohol, or iodine.  When you remove an adhesive bandage, be sure not to damage the skin around it.  If you have a wound, look at it several times a day to make sure it is healing.  Do not use heating pads or hot water bottles. They may burn your skin. If you have lost feeling in your feet or legs, you may not know it is happening until it is too late.  Make sure your health care provider performs a complete foot exam at least annually or more often if you have foot problems. Report any cuts, sores, or bruises to your health care provider immediately. SEEK MEDICAL CARE IF:   You have an injury that is not healing.  You have cuts or breaks in the skin.  You have an ingrown nail.  You notice redness on your legs or feet.  You feel burning or tingling in your legs or feet.  You have pain or cramps in your legs and feet.  Your legs or feet are numb.  Your feet always feel cold. SEEK IMMEDIATE MEDICAL CARE IF:   There is increasing redness,   swelling, or pain in or around a wound.  There is a red line that goes up your leg.  Pus is coming from a wound.  You develop a fever or as directed by your health care provider.  You notice a bad smell coming from an ulcer or wound. Document Released: 04/30/2000 Document Revised: 01/03/2013 Document Reviewed: 10/10/2012 Exit[employer] Patient Information 2015 [employer]. This information is not intended to replace advice given to you by your health care provider. Make sure you discuss any questions you have with your health care provider.  

## 2013-12-28 NOTE — Progress Notes (Signed)
HPI: Patient presents today for follow up of foot and nail care. Denies any new complaints today. He is going to physical therapy and swallow therapy at this time. Objective: Patients chart is reviewed. Vascular status reveals pedal pulses noted at 1 out of 4 dp and pt bilateral . Neurological sensation is Normal to [employer] monofilament bilateral. Patients nails are thickened, discolored, distrophic, friable and brittle with yellow-Marsee discoloration. Patient subjectively relates they are painful with shoes and with ambulation of bilateral feet.  Assessment: Symptomatic onychomycosis  Plan: Discussed treatment options and alternatives. The symptomatic toenails were debrided through manual an mechanical means without complication. Return appointment recommended at routine intervals of 3 months

## 2014-01-01 ENCOUNTER — Ambulatory Visit: Payer: Medicare Other

## 2014-01-01 ENCOUNTER — Ambulatory Visit: Payer: Medicare Other | Admitting: Physical Therapy

## 2014-01-01 DIAGNOSIS — Z5189 Encounter for other specified aftercare: Secondary | ICD-10-CM | POA: Diagnosis not present

## 2014-01-02 ENCOUNTER — Ambulatory Visit (HOSPITAL_COMMUNITY)
Admission: RE | Admit: 2014-01-02 | Discharge: 2014-01-02 | Disposition: A | Discharge status: Home / Self Care | Payer: Medicare Other | Source: Ambulatory Visit | Attending: Internal Medicine | Admitting: Internal Medicine

## 2014-01-02 DIAGNOSIS — E119 Type 2 diabetes mellitus without complications: Secondary | ICD-10-CM | POA: Insufficient documentation

## 2014-01-02 DIAGNOSIS — I6322 Cerebral infarction due to unspecified occlusion or stenosis of basilar arteries: Secondary | ICD-10-CM | POA: Insufficient documentation

## 2014-01-02 DIAGNOSIS — R1310 Dysphagia, unspecified: Secondary | ICD-10-CM

## 2014-01-02 DIAGNOSIS — I69922 Dysarthria following unspecified cerebrovascular disease: Secondary | ICD-10-CM | POA: Insufficient documentation

## 2014-01-02 DIAGNOSIS — R1312 Dysphagia, oropharyngeal phase: Secondary | ICD-10-CM | POA: Insufficient documentation

## 2014-01-02 DIAGNOSIS — I10 Essential (primary) hypertension: Secondary | ICD-10-CM | POA: Diagnosis not present

## 2014-01-02 DIAGNOSIS — I69991 Dysphagia following unspecified cerebrovascular disease: Secondary | ICD-10-CM | POA: Diagnosis present

## 2014-01-02 NOTE — Procedures (Signed)
Objective Swallowing Evaluation: Modified Barium Swallowing Study  Patient Details  Name: Clinton [name] MRN: [id number] Date of Birth: [birth year]  Today's Date: 01/02/2014 Time:  -     Past Medical History:  Past Medical History  Diagnosis Date  . Diabetes mellitus without complication   . Stroke   . Hypertension    Past Surgical History:  Past Surgical History  Procedure Laterality Date  . Eye surgery    . Cardiac catheterization     HPI:  80 yo male with h/o CVA a few years ago and new pons CVA June 2015 with resultant dysarthria/dysphagia.   Pt has been working with SLP at [facility] and returns for [facility] to determine readiness for dietary advancement.  Pt reports he has not use thickener in the last 3 weeks and has not had pulmonary infections/bad colds/fevers.  Weight is down 3 1/2 pounds since his CVA in June per spouse.  Pt also had some progressive weight loss since first CVA.  Mr Brittian does state that he is often not hungry and he does not believe his dysphagia has impacted his intake.  Pt denies requiring heimlich manuever and states he tolerates pills with pudding or glucerna.       Assessment / Plan / Recommendation Clinical Impression  Dysphagia Diagnosis: Moderate pharyngeal phase dysphagia;Mild pharyngeal phase dysphagia;Moderate oral phase dysphagia;Mild oral phase dysphagia   Clinical impression: Pt demonstrates marginal improvement in his oropharyngeal swallow ability.   He continues with mild aspiration due to sensorimotor deficits.  Note per previous MBS report, pt had some baseline dysphagia from previous CVA a few years ago.  Oral deficits continue with lingual rocking to propel bolus posterior.  Pt extends head upward and retains bolus in oral cavity with palatoglossus muscle prior to allowing spillage into pharynx.   Boluses then accumulate in pharynx - at pyriform sinus prior to swallow intiation - allowing mild to moderate amount of SILENT aspiration of thin liquids  and trace amount of nectar.  Various posture including chin tuck, head turn right/left did not improve swallow.  Advised pt against extending head upward due to opening airway and head neutral marginally improved swallow.  Liquid pharyngeal residuals more pronounced then solids/pudding.    Pt desires to consume thin liquids even with increased risk per his report and therefore SLP educated him to liquids best tolerated if/when aspirated.  Informed pt/spouse that pt is chronic aspiration risk regardless of consistency due to his chronic dysphagia.    Recommend pt continue with swallow compensation strategies to maximize airway protection.  He readily admits he is not conducting double swallows with bites/sips and is not using thickener.    Pt states his swallowing appeared to be normal during today's testing and he did not have awareness to his dysphagia due to his sensory deficit from CVA.  Used teach back and live video to educate pt and spouse extensively.    Currently pt appears to be tolerating this suspected chronic aspiration.  Provided spouse with written handout regarding heimlich manuever for emergent use.  Thanks for this consult.      Treatment Recommendation  Defer treatment plan to SLP at (Comment) (OP)    Diet Recommendation Dysphagia 3 (Mechanical Soft);Thin liquid   Liquid Administration via: Cup Medication Administration: Whole meds with puree (if small, crush if large) Supervision: Full supervision/cueing for compensatory strategies Compensations: Slow rate;Small sips/bites;Clear throat intermittently (intermittent dry swallow, rest break if coughing with intake due to definite aspiration with cough) Postural Changes  and/or Swallow Maneuvers: Seated upright 90 degrees;Upright 30-60 min after meal    Other  Recommendations Oral Care Recommendations: Oral care BID             SLP Swallow Goals     General Date of Onset: 01/02/14 HPI: 80 yo male with h/o CVA a few years  ago and new pons CVA June 2015 with resultant dysarthria/dysphagia.   Pt has been working with SLP at [facility] and returns for [facility] to determine readiness for dietary advancement.  Pt reports he has not use thickener in the last 3 weeks and has not had pulmonary infections/bad colds/fevers.  Weight is down 3 1/2 pounds since his CVA in June per spouse.  Pt also had some progressive weight loss since first CVA.  Mr Wilmes does state that he is often not hungry and he does not believe his dysphagia has impacted his intake.  Pt denies requiring heimlich manuever and states he tolerates pills with pudding or glucerna.   Type of Study: Modified Barium Swallowing Study Diet Prior to this Study: Dysphagia 3 (soft);Nectar-thick liquids (but pt admits to consuming thin due to displeasure with thickened drinks) Temperature Spikes Noted: No Respiratory Status: Room air History of Recent Intubation: No Behavior/Cognition: Alert;Cooperative;Pleasant mood Oral Cavity - Dentition: Adequate natural dentition Oral Motor / Sensory Function:  (? mildly facial asymmetry on left, palatal deviation to right upon phonation, ? vagal nerve involvment from CVA evidenced by chronic hoarseness/throat clearing) Self-Feeding Abilities: Able to feed self Patient Positioning: Upright in chair Baseline Vocal Quality: Hoarse;Low vocal intensity Volitional Cough: Weak Volitional Swallow: Unable to elicit Anatomy: Within functional limits Pharyngeal Secretions:  (pt states he has nasal secretions often requiring him to clear his throat)    Reason for Referral     Oral Phase Oral Preparation/Oral Phase Oral Phase: Impaired Oral - Nectar Oral - Nectar Cup: Lingual pumping;Weak lingual manipulation;Delayed oral transit;Reduced posterior propulsion Oral - Thin Oral - Thin Teaspoon: Weak lingual manipulation;Lingual pumping;Delayed oral transit;Reduced posterior propulsion Oral - Thin Cup: Weak lingual manipulation;Lingual  pumping;Delayed oral transit;Reduced posterior propulsion Oral - Thin Straw: Weak lingual manipulation;Lingual pumping;Delayed oral transit;Reduced posterior propulsion Oral - Solids Oral - Puree: Weak lingual manipulation;Lingual pumping;Delayed oral transit;Reduced posterior propulsion;Piecemeal swallowing Oral - Regular: Weak lingual manipulation;Lingual pumping;Delayed oral transit;Reduced posterior propulsion;Piecemeal swallowing Oral Phase - Comment Oral Phase - Comment: pt extends head upward to transit barium into pharynx from oral cavity, he states this posture makes it avoid the "air tube" better - however educated him to concerns for opening airway further, head turn right/left, chin tuck not helpful to decrease stasis or decrease aspiration, head neutral prevent gross aspiration of liquids   Pharyngeal Phase Pharyngeal Phase Pharyngeal Phase: Impaired Pharyngeal - Nectar Pharyngeal - Nectar Cup: Pharyngeal residue - valleculae;Pharyngeal residue - pyriform sinuses;Delayed swallow initiation;Premature spillage to pyriform sinuses;Reduced epiglottic inversion;Trace aspiration Pharyngeal - Thin Pharyngeal - Thin Teaspoon: Premature spillage to pyriform sinuses;Delayed swallow initiation;Pharyngeal residue - valleculae;Pharyngeal residue - pyriform sinuses;Reduced epiglottic inversion Pharyngeal - Thin Cup: Pharyngeal residue - valleculae;Pharyngeal residue - pyriform sinuses;Premature spillage to pyriform sinuses;Delayed swallow initiation;Reduced epiglottic inversion;Penetration/Aspiration before swallow;Penetration/Aspiration during swallow;Moderate aspiration Penetration/Aspiration details (thin cup): Material enters airway, passes BELOW cords without attempt by patient to eject out (silent aspiration);Material enters airway, passes BELOW cords and not ejected out despite cough attempt by patient Pharyngeal - Thin Straw: Reduced epiglottic inversion;Delayed swallow initiation;Premature  spillage to pyriform sinuses;Pharyngeal residue - pyriform sinuses;Pharyngeal residue - valleculae;Penetration/Aspiration before swallow;Moderate aspiration Penetration/Aspiration details (thin straw):  Material enters airway, passes BELOW cords without attempt by patient to eject out (silent aspiration) Pharyngeal - Solids Pharyngeal - Puree: Premature spillage to valleculae;Delayed swallow initiation;Reduced epiglottic inversion;Pharyngeal residue - valleculae Pharyngeal - Regular: Premature spillage to valleculae;Delayed swallow initiation;Reduced epiglottic inversion;Pharyngeal residue - valleculae Pharyngeal Phase - Comment Pharyngeal Comment: cued dry swallows help to decrease pharyngeal stasis but not fully eliminate liquid residuals  Cervical Esophageal Phase    GO    Cervical Esophageal Phase Cervical Esophageal Phase: Impaired Cervical Esophageal Phase - Comment Cervical Esophageal Comment: appearance of decreased UES opening resulting in stasis of liquids more than solids    Functional Assessment Tool Used: MBS, clinical judgement Functional Limitations: Swallowing Swallow Current Status ([id number]): At least 40 percent but less than 60 percent impaired, limited or restricted Swallow Goal Status [phone]): At least 40 percent but less than 60 percent impaired, limited or restricted Swallow Discharge Status [phone]): At least 40 percent but less than 60 percent impaired, limited or restricted    Luanna Salk, [place] [facility] SLP [phone]

## 2014-01-03 ENCOUNTER — Ambulatory Visit: Payer: Medicare Other | Admitting: Physical Therapy

## 2014-01-03 DIAGNOSIS — Z5189 Encounter for other specified aftercare: Secondary | ICD-10-CM | POA: Diagnosis not present

## 2014-01-08 ENCOUNTER — Ambulatory Visit: Payer: Medicare Other | Admitting: Physical Therapy

## 2014-01-08 ENCOUNTER — Ambulatory Visit: Payer: Medicare Other

## 2014-01-08 DIAGNOSIS — Z5189 Encounter for other specified aftercare: Secondary | ICD-10-CM | POA: Diagnosis not present

## 2014-01-10 ENCOUNTER — Ambulatory Visit: Payer: Medicare Other | Admitting: Physical Therapy

## 2014-01-10 DIAGNOSIS — Z5189 Encounter for other specified aftercare: Secondary | ICD-10-CM | POA: Diagnosis not present

## 2014-01-15 ENCOUNTER — Encounter: Payer: Medicare Other | Admitting: Speech Pathology

## 2014-01-15 ENCOUNTER — Ambulatory Visit: Payer: Medicare Other | Attending: Internal Medicine | Admitting: Physical Therapy

## 2014-01-15 ENCOUNTER — Ambulatory Visit: Payer: Medicare Other | Admitting: Speech Pathology

## 2014-01-15 DIAGNOSIS — R269 Unspecified abnormalities of gait and mobility: Secondary | ICD-10-CM | POA: Diagnosis not present

## 2014-01-15 DIAGNOSIS — R5381 Other malaise: Secondary | ICD-10-CM | POA: Insufficient documentation

## 2014-01-15 DIAGNOSIS — E119 Type 2 diabetes mellitus without complications: Secondary | ICD-10-CM | POA: Insufficient documentation

## 2014-01-15 DIAGNOSIS — M1990 Unspecified osteoarthritis, unspecified site: Secondary | ICD-10-CM | POA: Insufficient documentation

## 2014-01-15 DIAGNOSIS — Z5189 Encounter for other specified aftercare: Secondary | ICD-10-CM | POA: Insufficient documentation

## 2014-01-15 DIAGNOSIS — M6281 Muscle weakness (generalized): Secondary | ICD-10-CM | POA: Insufficient documentation

## 2014-01-15 DIAGNOSIS — C61 Malignant neoplasm of prostate: Secondary | ICD-10-CM | POA: Diagnosis not present

## 2014-01-15 DIAGNOSIS — I69922 Dysarthria following unspecified cerebrovascular disease: Secondary | ICD-10-CM | POA: Insufficient documentation

## 2014-01-15 DIAGNOSIS — R490 Dysphonia: Secondary | ICD-10-CM | POA: Diagnosis not present

## 2014-01-15 DIAGNOSIS — I69991 Dysphagia following unspecified cerebrovascular disease: Secondary | ICD-10-CM | POA: Insufficient documentation

## 2014-01-15 DIAGNOSIS — R1310 Dysphagia, unspecified: Secondary | ICD-10-CM | POA: Insufficient documentation

## 2014-01-15 DIAGNOSIS — I69998 Other sequelae following unspecified cerebrovascular disease: Secondary | ICD-10-CM | POA: Diagnosis not present

## 2014-01-17 ENCOUNTER — Ambulatory Visit: Payer: Medicare Other | Admitting: Speech Pathology

## 2014-01-17 ENCOUNTER — Ambulatory Visit: Payer: Medicare Other | Admitting: Physical Therapy

## 2014-01-17 DIAGNOSIS — Z5189 Encounter for other specified aftercare: Secondary | ICD-10-CM | POA: Diagnosis not present

## 2014-02-21 ENCOUNTER — Telehealth: Payer: Self-pay | Admitting: Neurology

## 2014-02-21 MED ORDER — ASPIRIN-DIPYRIDAMOLE ER 25-200 MG PO CP12: 1.0000 | ORAL_CAPSULE | Freq: Two times a day (BID) | ORAL | Status: DC

## 2014-02-21 NOTE — Telephone Encounter (Signed)
Patient's wife calling on behalf of patient, requesting refill of Aggrenox to be sent to [place], please return call and advise.

## 2014-02-21 NOTE — Telephone Encounter (Signed)
Rx has been sent.  I called back, got no answer, left message.

## 2014-02-25 ENCOUNTER — Other Ambulatory Visit: Payer: Self-pay

## 2014-02-25 MED ORDER — ASPIRIN-DIPYRIDAMOLE ER 25-200 MG PO CP12: 1.0000 | ORAL_CAPSULE | Freq: Two times a day (BID) | ORAL | Status: DC

## 2014-02-25 NOTE — Telephone Encounter (Signed)
[facility] asked that we resent Rx again.

## 2014-03-04 ENCOUNTER — Encounter: Payer: Self-pay | Admitting: Neurology

## 2014-03-04 ENCOUNTER — Ambulatory Visit (INDEPENDENT_AMBULATORY_CARE_PROVIDER_SITE_OTHER): Payer: Medicare Other | Admitting: Neurology

## 2014-03-04 VITALS — BP 130/66 | HR 69 | Wt 118.2 lb

## 2014-03-04 DIAGNOSIS — I639 Cerebral infarction, unspecified: Secondary | ICD-10-CM

## 2014-03-04 DIAGNOSIS — R1310 Dysphagia, unspecified: Secondary | ICD-10-CM | POA: Insufficient documentation

## 2014-03-04 DIAGNOSIS — E119 Type 2 diabetes mellitus without complications: Secondary | ICD-10-CM | POA: Insufficient documentation

## 2014-03-04 DIAGNOSIS — E1159 Type 2 diabetes mellitus with other circulatory complications: Secondary | ICD-10-CM

## 2014-03-04 MED ORDER — CLOPIDOGREL BISULFATE 75 MG PO TABS: 75.0000 mg | ORAL_TABLET | Freq: Every day | ORAL | Status: DC

## 2014-03-04 NOTE — Progress Notes (Signed)
Today we had the pleasure of seeing Clinton [name] in follow-up at our Neurology Clinic.  Interval history was obtained from pt and his wife.   History Summary 80 yo male with PMH of DM and CVA 5 years ago presented for hospital follow up. He had stroke about 5 years ago with sudden onset speech and swallowing difficulty, was on ASA and metformin at that time. He was treated with ASA and plavix and his speech became normal over the years but still has mild swallow problems. On 10/15/13, he again had acute onset slurry speech and worsening of his swallow difficulty. He went to urgent care and right facial droop noted and he was sent to [facility] for code stroke. CT no acute abnormality but MRI showed pontine stroke. CUS and 2D echo unremarkable. A1C 6.0 and LDL 61. He was discharged with aggrenox.   Follow up 11/26/13 - After discharge home, he has been followed with speech pathology and he still has some difficult with water but doing better, and still not back to baseline yet. Walk with walker, no fall at home. Continue to take aggrenox bid and metformin bid.   Interval History During the interval time, he was seen by swallow again and recently discharged from swallow. Still has mild dysarthria but pt felt some improvement than before. He also started driving, felt comfortable. He stated that since he took aggrenox, he has diarrhea and he wanted to switch to some other medication if there is one.   Current Outpatient Prescriptions on File Prior to Visit  Medication Sig Dispense Refill  . atorvastatin (LIPITOR) 10 MG tablet Take 1 tablet (10 mg total) by mouth daily.  30 tablet  3  . metFORMIN (GLUCOPHAGE) 500 MG tablet Take 500 mg by mouth 2 (two) times daily with a meal.       No current facility-administered medications on file prior to visit.    The following represents the patient's updated allergies and side effects list: No Known Allergies  Labs since last visit of relevance include the  following: Results for orders placed during the hospital encounter of 10/15/13  ETHANOL      Result Value Ref Range   Alcohol, Ethyl (B) <11  0 - 11 mg/dL  PROTIME-INR      Result Value Ref Range   Prothrombin Time 13.0  11.6 - 15.2 seconds   INR 1.00  0.00 - 1.49  APTT      Result Value Ref Range   aPTT 30  24 - 37 seconds  CBC      Result Value Ref Range   WBC 14.2 (*) 4.0 - 10.5 K/uL   RBC 4.04 (*) 4.22 - 5.81 MIL/uL   Hemoglobin 12.7 (*) 13.0 - 17.0 g/dL   HCT 37.7 (*) 39.0 - 52.0 %   MCV 93.3  78.0 - 100.0 fL   MCH 31.4  26.0 - 34.0 pg   MCHC 33.7  30.0 - 36.0 g/dL   RDW 12.9  11.5 - 15.5 %   Platelets 147 (*) 150 - 400 K/uL  DIFFERENTIAL      Result Value Ref Range   Neutrophils Relative % 88 (*) 43 - 77 %   Neutro Abs 12.5 (*) 1.7 - 7.7 K/uL   Lymphocytes Relative 7 (*) 12 - 46 %   Lymphs Abs 1.1  0.7 - 4.0 K/uL   Monocytes Relative 5  3 - 12 %   Monocytes Absolute 0.7  0.1 - 1.0 K/uL  Eosinophils Relative 0  0 - 5 %   Eosinophils Absolute 0.0  0.0 - 0.7 K/uL   Basophils Relative 0  0 - 1 %   Basophils Absolute 0.0  0.0 - 0.1 K/uL  COMPREHENSIVE METABOLIC PANEL      Result Value Ref Range   Sodium 140  137 - 147 mEq/L   Potassium 4.4  3.7 - 5.3 mEq/L   Chloride 103  96 - 112 mEq/L   CO2 25  19 - 32 mEq/L   Glucose, Bld 215 (*) 70 - 99 mg/dL   BUN 26 (*) 6 - 23 mg/dL   Creatinine, Ser 0.84  0.50 - 1.35 mg/dL   Calcium 9.6  8.4 - 10.5 mg/dL   Total Protein 6.4  6.0 - 8.3 g/dL   Albumin 3.5  3.5 - 5.2 g/dL   AST 24  0 - 37 U/L   ALT 15  0 - 53 U/L   Alkaline Phosphatase 67  39 - 117 U/L   Total Bilirubin 0.6  0.3 - 1.2 mg/dL   GFR calc non Af Amer 76 (*) >90 mL/min   GFR calc Af Amer 89 (*) >90 mL/min  URINE RAPID DRUG SCREEN (HOSP PERFORMED)      Result Value Ref Range   Opiates NONE DETECTED  NONE DETECTED   Cocaine NONE DETECTED  NONE DETECTED   Benzodiazepines NONE DETECTED  NONE DETECTED   Amphetamines NONE DETECTED  NONE DETECTED    Tetrahydrocannabinol NONE DETECTED  NONE DETECTED   Barbiturates NONE DETECTED  NONE DETECTED  URINALYSIS, ROUTINE W REFLEX MICROSCOPIC      Result Value Ref Range   Color, Urine YELLOW  YELLOW   APPearance CLEAR  CLEAR   Specific Gravity, Urine 1.024  1.005 - 1.030   pH 6.0  5.0 - 8.0   Glucose, UA 500 (*) NEGATIVE mg/dL   Hgb urine dipstick TRACE (*) NEGATIVE   Bilirubin Urine NEGATIVE  NEGATIVE   Ketones, ur 15 (*) NEGATIVE mg/dL   Protein, ur NEGATIVE  NEGATIVE mg/dL   Urobilinogen, UA 0.2  0.0 - 1.0 mg/dL   Nitrite NEGATIVE  NEGATIVE   Leukocytes, UA NEGATIVE  NEGATIVE  HEMOGLOBIN A1C      Result Value Ref Range   Hemoglobin A1C 6.0 (*) <5.7 %   Mean Plasma Glucose 126 (*) <117 mg/dL  LIPID PANEL      Result Value Ref Range   Cholesterol 146  0 - 200 mg/dL   Triglycerides 62  <150 mg/dL   HDL 73  >39 mg/dL   Total CHOL/HDL Ratio 2.0     VLDL 12  0 - 40 mg/dL   LDL Cholesterol 61  0 - 99 mg/dL  URINE MICROSCOPIC-ADD ON      Result Value Ref Range   Squamous Epithelial / LPF RARE  RARE   WBC, UA 0-2  <3 WBC/hpf   RBC / HPF 0-2  <3 RBC/hpf   Bacteria, UA RARE  RARE  GLUCOSE, CAPILLARY      Result Value Ref Range   Glucose-Capillary 117 (*) 70 - 99 mg/dL  GLUCOSE, CAPILLARY      Result Value Ref Range   Glucose-Capillary 118 (*) 70 - 99 mg/dL  GLUCOSE, CAPILLARY      Result Value Ref Range   Glucose-Capillary 102 (*) 70 - 99 mg/dL  GLUCOSE, CAPILLARY      Result Value Ref Range   Glucose-Capillary 144 (*) 70 - 99 mg/dL  GLUCOSE, CAPILLARY  Result Value Ref Range   Glucose-Capillary 125 (*) 70 - 99 mg/dL   Comment 1 Notify RN     Comment 2 Documented in Chart    GLUCOSE, CAPILLARY      Result Value Ref Range   Glucose-Capillary 98  70 - 99 mg/dL   Comment 1 Notify RN     Comment 2 Documented in Chart    GLUCOSE, CAPILLARY      Result Value Ref Range   Glucose-Capillary 136 (*) 70 - 99 mg/dL   Comment 1 Notify RN     Comment 2 Documented in Chart     I-STAT CHEM 8, ED      Result Value Ref Range   Sodium 142  137 - 147 mEq/L   Potassium 4.2  3.7 - 5.3 mEq/L   Chloride 99  96 - 112 mEq/L   BUN 26 (*) 6 - 23 mg/dL   Creatinine, Ser 0.90  0.50 - 1.35 mg/dL   Glucose, Bld 212 (*) 70 - 99 mg/dL   Calcium, Ion 1.31 (*) 1.13 - 1.30 mmol/L   TCO2 25  0 - 100 mmol/L   Hemoglobin 13.9  13.0 - 17.0 g/dL   HCT 41.0  39.0 - 52.0 %  I-STAT TROPOININ, ED      Result Value Ref Range   Troponin i, poc 0.01  0.00 - 0.08 ng/mL   Comment 3           CBG MONITORING, ED      Result Value Ref Range   Glucose-Capillary 189 (*) 70 - 99 mg/dL   Comment 1 Notify RN     Comment 2 Documented in Chart      The neurologically relevant items on the patient's problem list were reviewed on today's visit.  Review of Systems  Neurological: Positive for speech change (and swallow difficulty. walk with walker).  All other systems reviewed and are negative.  Neurologic Examination  A problem focused neurological exam (12 or more points of the single system neurologic examination, vital signs counts as 1 point, cranial nerves count for 8 points) was performed.  Blood pressure 130/66, pulse 69, weight 118 lb 3.2 oz (53.615 kg).  General - Well nourished, well developed, in no apparent distress.  Ophthalmologic - Sharp disc margins OU.  Cardiovascular - Regular rate and rhythm with no murmur.  Mental Status -  Level of arousal and orientation to time, place, and person were intact. Language including expression, naming, repetition, comprehension was assessed and found intact.  Cranial Nerves II - XII - II - Vision intact OU. III, IV, VI - Extraocular movements intact. V - Facial sensation intact bilaterally. VII - Facial movement intact bilaterally. VIII - Hearing & vestibular intact bilaterally. X - Palate elevates symmetrically, mild dysarthria. XI - Chin turning & shoulder shrug intact bilaterally. XII - Tongue protrusion intact.  Motor  Strength - The patient's strength was normal in all extremities and pronator drift was absent.  Bulk was decreased in all extremities and fasciculations were absent.   Motor Tone - Muscle tone was assessed at the neck and appendages and was normal.  Reflexes - The patient's reflexes were decreased in all extremities and he had no pathological reflexes.  Sensory - Light touch, temperature/pinprick were assessed and were normal.    Coordination - The patient had normal movements in the hands and feet with no ataxia or dysmetria.  Tremor was absent.  Gait and Station - walk with cane without difficulites,  but slow, stooped posturing.  Data reviewed: CT of the brain 10/15/2013 No acute intracranial abnormality. Atrophy and small vessel disease.   MRI of the brain 10/16/2013 Acute pontine infarcts. Remote bilateral thalamic, basal ganglia lacunar infarcts with remote right corona radiata infarct previously seen. Remote left basal ganglia hemorrhagic infarct, with punctate focus of susceptibility artifact in the right thalamus most likely reflecting hemorrhagic infarct. Moderate white matter changes suggest chronic small vessel ischemic disease.   MRA of the brain 10/16/2013 Asymmetric flow related enhancement of left vertebral artery, which could reflect slow flow, and may be better characterized on CT of the head clinically indicated. No large vessel occlusion. Less than 50% narrowing of the right posterior cerebral artery.   2D Echocardiogram 10/16/13  - Left ventricle: The cavity size was normal. Systolic function was normal. The estimated ejection fraction was in the range of 55% to 60%. Wall motion was normal; there were no regional wall motion abnormalities.  - Atrial septum: No defect or patent foramen ovale was identified.  Carotid Doppler 10/16/13  - The vertebral arteries appear patent with antegrade flow. - Findings consistent with 1- 39 percent stenosis involving the right internal carotid  artery and the left internal carotid artery.  Assessment: As you may recall, he is a 80 y.o. Caucasian male with hx of DM and prior CVA followed up in clinic today for stroke. He had pontine stroke on 10/15/13 and confirmed on MRI. Stroke w/u include CUS, 2D echo, A1C and LDL were unremarkable. MRI showed chronic lacunar strokes. His stroke most likely small vessel stroke, risk factors are age, and DM. Although his A1C 6.0 during hospitalization, but his glucose level checked in hospital were all more than 200s. Therefore, his glucose level as home should be monitored. Will recommend PCP to arrange home glucose monitoring. He will need continued swallow follow up and put on low dose lipitor for stroke prevention.  Diagnoses from this visit: CVA (cerebral vascular accident) - Plan: clopidogrel (PLAVIX) 53 MG tablet  Type 2 diabetes mellitus with other circulatory complications  Plan:  - switch aggrenox to plavix for stroke prevention - continue lipitor for stroke prevention - OK for driving if he and his family feel comfortable - Please follow up with your primary care physician for stroke risk factor modification. - RTC in 6 months.  No orders of the defined types were placed in this encounter.   Patient Instructions  - switch aggrenox to plavix for stroke prevention due to side effect. - you can start driving as long as you and your family feel comforatble of your driving and they have ride with you for 2-3 times and feels comfortable. - continue lipitor for stroke prevention.  - follow up with PCP for stroke risk factor modification.  - follow up in 6 months.

## 2014-03-04 NOTE — Patient Instructions (Signed)
-   switch aggrenox to plavix for stroke prevention due to side effect. - you can start driving as long as you and your family feel comforatble of your driving and they have ride with you for 2-3 times and feels comfortable. - continue lipitor for stroke prevention.  - follow up with PCP for stroke risk factor modification.  - follow up in 6 months.

## 2014-03-26 ENCOUNTER — Other Ambulatory Visit: Payer: Self-pay | Admitting: Neurology

## 2014-05-07 ENCOUNTER — Ambulatory Visit (INDEPENDENT_AMBULATORY_CARE_PROVIDER_SITE_OTHER): Payer: Medicare Other | Admitting: Ophthalmology

## 2014-05-07 DIAGNOSIS — H3531 Nonexudative age-related macular degeneration: Secondary | ICD-10-CM

## 2014-05-07 DIAGNOSIS — E11329 Type 2 diabetes mellitus with mild nonproliferative diabetic retinopathy without macular edema: Secondary | ICD-10-CM

## 2014-05-07 DIAGNOSIS — E11319 Type 2 diabetes mellitus with unspecified diabetic retinopathy without macular edema: Secondary | ICD-10-CM

## 2014-05-07 DIAGNOSIS — E11359 Type 2 diabetes mellitus with proliferative diabetic retinopathy without macular edema: Secondary | ICD-10-CM

## 2014-05-07 DIAGNOSIS — H43813 Vitreous degeneration, bilateral: Secondary | ICD-10-CM

## 2014-06-27 ENCOUNTER — Ambulatory Visit (INDEPENDENT_AMBULATORY_CARE_PROVIDER_SITE_OTHER): Payer: Medicare Other | Admitting: Podiatrist

## 2014-06-27 ENCOUNTER — Encounter: Payer: Self-pay | Admitting: Podiatrist

## 2014-06-27 DIAGNOSIS — B351 Tinea unguium: Secondary | ICD-10-CM

## 2014-06-27 DIAGNOSIS — M79673 Pain in unspecified foot: Secondary | ICD-10-CM

## 2014-06-27 NOTE — Progress Notes (Signed)
HPI: Patient presents today for follow up of foot and nail care. Denies any new complaints today. He is going to physical therapy and swallow therapy at this time. Objective: Patients chart is reviewed. Vascular status reveals pedal pulses noted at 1 out of 4 dp and pt bilateral . Neurological sensation is Normal to [employer] monofilament bilateral. Patients nails are thickened, discolored, distrophic, friable and brittle with yellow-Marron discoloration. Patient subjectively relates they are painful with shoes and with ambulation of bilateral feet.  Assessment: Symptomatic onychomycosis  Plan: Discussed treatment options and alternatives. The symptomatic toenails were debrided through manual an mechanical means without complication. Return appointment recommended at routine intervals of 6 months

## 2014-09-03 ENCOUNTER — Encounter: Payer: Self-pay | Admitting: Neurology

## 2014-09-03 ENCOUNTER — Ambulatory Visit (INDEPENDENT_AMBULATORY_CARE_PROVIDER_SITE_OTHER): Payer: Medicare Other | Admitting: Neurology

## 2014-09-03 VITALS — BP 131/82 | HR 72 | Wt 124.4 lb

## 2014-09-03 DIAGNOSIS — E1159 Type 2 diabetes mellitus with other circulatory complications: Secondary | ICD-10-CM

## 2014-09-03 DIAGNOSIS — R1310 Dysphagia, unspecified: Secondary | ICD-10-CM

## 2014-09-03 DIAGNOSIS — I6302 Cerebral infarction due to thrombosis of basilar artery: Secondary | ICD-10-CM | POA: Diagnosis not present

## 2014-09-03 NOTE — Patient Instructions (Signed)
-   discontinue ASA for now - continue plavix and lipitor for stroke prevention - Follow up with your primary care physician for stroke risk factor modification. Recommend maintain blood pressure goal <130/80, diabetes with hemoglobin A1c goal below 6.5% and lipids with LDL cholesterol goal below 70 mg/dL.  - mechanical soft diet and avoid choking, eat slow - follow up as needed.

## 2014-09-03 NOTE — Progress Notes (Signed)
STROKE NEUROLOGY FOLLOW UP NOTE  NAME: Clinton [name] DOB: [birth year]  REASON FOR VISIT: stroke follow up HISTORY FROM: pt and wife and chart  Today we had the pleasure of seeing Clinton [name] in follow-up at our Neurology Clinic. Interval history was obtained from pt and his wife.   History Summary 81 yo male with PMH of DM and CVA 5 years ago presented for hospital follow up. He had stroke about 5 years ago with sudden onset speech and swallowing difficulty, was on ASA and metformin at that time. He was treated with ASA and plavix and his speech became normal over the years but still has mild swallow problems. On 10/15/13, he again had acute onset slurry speech and worsening of his swallow difficulty. He went to urgent care and right facial droop noted and he was sent to [facility] hospital for code stroke. CT no acute abnormality but MRI showed pontine stroke. CUS and 2D echo unremarkable. A1C 6.0 and LDL 61. He was discharged with aggrenox.   Follow up 11/26/13 - After discharge home, he has been followed with speech pathology and he still has some difficult with water but doing better, and still not back to baseline yet. Walk with walker, no fall at home. Continue to take aggrenox bid and metformin bid.   Follow up 03/04/14 - he was seen by swallow again and recently discharged from swallow. Still has mild dysarthria but pt felt some improvement than before. He also started driving, felt comfortable. He stated that since he took aggrenox, he has diarrhea and he wanted to switch to some other medication if there is one.   Interval History During the interval time, the patient has been doing well. Still has dysarthria and mild swallow difficulty, but he is taking mechanical soft diet and on thin liquid. Occasionally he will have choking on water. His BP 131/82 today in clinic and he stated that his BP and glucose always good at home so he did not check BP or glucose at home. He follows Dr. Michail Sermon as  his PCP. He is on ASA and plavix now, however, on last visit note, he was switched from aggrenox to plavix. He stated that he has been always on baby ASA.     REVIEW OF SYSTEMS: Full 14 system review of systems performed and notable only for those listed below and in HPI above, all others are negative:  Constitutional:   Cardiovascular:  Ear/Nose/Throat:   Skin:  Eyes:   Respiratory:   Gastroitestinal:   Genitourinary:  Hematology/Lymphatic:   Endocrine:  Musculoskeletal:   Allergy/Immunology:   Neurological:   Psychiatric:  Sleep:   The following represents the patient's updated allergies and side effects list: No Known Allergies  The neurologically relevant items on the patient's problem list were reviewed on today's visit.  Neurologic Examination  A problem focused neurological exam (12 or more points of the single system neurologic examination, vital signs counts as 1 point, cranial nerves count for 8 points) was performed.  Blood pressure 130/66, pulse 69, weight 118 lb 3.2 oz (53.615 kg).  General - Well nourished, well developed, in no apparent distress.  Ophthalmologic - Sharp disc margins OU.  Cardiovascular - Regular rate and rhythm with no murmur.  Mental Status -  Level of arousal and orientation to time, place, and person were intact. Language including expression, naming, repetition, comprehension was assessed and found intact.  Cranial Nerves II - XII - II - Vision intact OU. III,  IV, VI - Extraocular movements intact. V - Facial sensation intact bilaterally. VII - Facial movement intact bilaterally. VIII - Hearing & vestibular intact bilaterally. X - Palate elevates symmetrically, mild dysarthria. XI - Chin turning & shoulder shrug intact bilaterally. XII - Tongue protrusion intact.  Motor Strength - The patient's strength was normal in all extremities and pronator drift was absent. Bulk was decreased in all extremities and fasciculations were  absent.  Motor Tone - Muscle tone was assessed at the neck and appendages and was normal.  Reflexes - The patient's reflexes were decreased in all extremities and he had no pathological reflexes.  Sensory - Light touch, temperature/pinprick were assessed and were normal.   Coordination - The patient had normal movements in the hands with no ataxia or dysmetria. Tremor was absent.  Gait and Station - walk with cane without difficulites, but slow, stooped posturing, small strides.  Data reviewed: I personally reviewed the images and agree with the radiology interpretations.  CT of the brain 10/15/2013 No acute intracranial abnormality. Atrophy and small vessel disease.   MRI of the brain 10/16/2013 Acute pontine infarcts. Remote bilateral thalamic, basal ganglia lacunar infarcts with remote right corona radiata infarct previously seen. Remote left basal ganglia hemorrhagic infarct, with punctate focus of susceptibility artifact in the right thalamus most likely reflecting hemorrhagic infarct. Moderate white matter changes suggest chronic small vessel ischemic disease.   MRA of the brain 10/16/2013 Asymmetric flow related enhancement of left vertebral artery, which could reflect slow flow, and may be better characterized on CT of the head clinically indicated. No large vessel occlusion. Less than 50% narrowing of the right posterior cerebral artery.   2D Echocardiogram 10/16/13  - Left ventricle: The cavity size was normal. Systolic function was normal. The estimated ejection fraction was in the range of 55% to 60%. Wall motion was normal; there were no regional wall motion abnormalities.  - Atrial septum: No defect or patent foramen ovale was identified.  Carotid Doppler 10/16/13  - The vertebral arteries appear patent with antegrade flow. - Findings consistent with 1- 39 percent stenosis involving the right internal carotid artery and the left internal carotid artery.  Component     Latest  Ref Rng 10/16/2013  Cholesterol     0 - 200 mg/dL 146  Triglycerides     <150 mg/dL 62  HDL Cholesterol     >39 mg/dL 73  Total CHOL/HDL Ratio      2.0  VLDL     0 - 40 mg/dL 12  LDL (calc)     0 - 99 mg/dL 61  Hemoglobin A1C     <5.7 % 6.0 (H)  Mean Plasma Glucose     <117 mg/dL 126 (H)    Assessment: As you may recall, he is a 81 y.o. Caucasian male with hx of DM and prior CVA followed up in clinic today for stroke. He had pontine stroke on 10/15/13 and confirmed on MRI. Stroke w/u include CUS, 2D echo, A1C and LDL were unremarkable. MRI showed chronic lacunar strokes. His stroke most likely small vessel stroke, risk factors are age and DM. He is currently on dural antiplatelet, will d/c ASA and continue plavix and lipitor. Still has dysarthria and mild swallow difficulty but on mechanical soft diet with thin liquid. Encourage small bite and more chewing and eat slow.  Plan:  - discontinue ASA for now - continue plavix and lipitor for stroke prevention - Follow up with your primary care physician for  stroke risk factor modification. Recommend maintain blood pressure goal <130/80, diabetes with hemoglobin A1c goal below 6.5% and lipids with LDL cholesterol goal below 70 mg/dL.  - mechanical soft diet and avoid choking, eat slow - RTC PRN.  No orders of the defined types were placed in this encounter.    No orders of the defined types were placed in this encounter.    Patient Instructions  - discontinue ASA for now - continue plavix and lipitor for stroke prevention - Follow up with your primary care physician for stroke risk factor modification. Recommend maintain blood pressure goal <130/80, diabetes with hemoglobin A1c goal below 6.5% and lipids with LDL cholesterol goal below 70 mg/dL.  - mechanical soft diet and avoid choking, eat slow - follow up as needed.    Rosalin Hawking, MD PhD [facility] [address] [place], [place] [address] [phone]

## 2014-09-30 ENCOUNTER — Other Ambulatory Visit: Payer: Self-pay | Admitting: Neurology

## 2014-12-26 ENCOUNTER — Ambulatory Visit: Payer: Medicare Other | Admitting: Podiatry

## 2015-04-01 ENCOUNTER — Other Ambulatory Visit: Payer: Self-pay | Admitting: Neurology

## 2015-05-06 ENCOUNTER — Ambulatory Visit (INDEPENDENT_AMBULATORY_CARE_PROVIDER_SITE_OTHER): Payer: Medicare Other | Admitting: Ophthalmology

## 2015-05-06 DIAGNOSIS — E113291 Type 2 diabetes mellitus with mild nonproliferative diabetic retinopathy without macular edema, right eye: Secondary | ICD-10-CM | POA: Diagnosis not present

## 2015-05-06 DIAGNOSIS — H353131 Nonexudative age-related macular degeneration, bilateral, early dry stage: Secondary | ICD-10-CM

## 2015-05-06 DIAGNOSIS — E113592 Type 2 diabetes mellitus with proliferative diabetic retinopathy without macular edema, left eye: Secondary | ICD-10-CM

## 2015-05-06 DIAGNOSIS — E11319 Type 2 diabetes mellitus with unspecified diabetic retinopathy without macular edema: Secondary | ICD-10-CM | POA: Diagnosis not present

## 2015-05-06 DIAGNOSIS — H43813 Vitreous degeneration, bilateral: Secondary | ICD-10-CM | POA: Diagnosis not present

## 2016-05-07 ENCOUNTER — Ambulatory Visit (INDEPENDENT_AMBULATORY_CARE_PROVIDER_SITE_OTHER): Payer: Medicare Other | Admitting: Ophthalmology

## 2016-05-07 DIAGNOSIS — E11319 Type 2 diabetes mellitus with unspecified diabetic retinopathy without macular edema: Secondary | ICD-10-CM | POA: Diagnosis not present

## 2016-05-07 DIAGNOSIS — E113592 Type 2 diabetes mellitus with proliferative diabetic retinopathy without macular edema, left eye: Secondary | ICD-10-CM | POA: Diagnosis not present

## 2016-05-07 DIAGNOSIS — H35033 Hypertensive retinopathy, bilateral: Secondary | ICD-10-CM | POA: Diagnosis not present

## 2016-05-07 DIAGNOSIS — I10 Essential (primary) hypertension: Secondary | ICD-10-CM | POA: Diagnosis not present

## 2016-05-07 DIAGNOSIS — H43811 Vitreous degeneration, right eye: Secondary | ICD-10-CM

## 2016-05-07 DIAGNOSIS — H353111 Nonexudative age-related macular degeneration, right eye, early dry stage: Secondary | ICD-10-CM

## 2016-05-07 DIAGNOSIS — E113291 Type 2 diabetes mellitus with mild nonproliferative diabetic retinopathy without macular edema, right eye: Secondary | ICD-10-CM | POA: Diagnosis not present

## 2016-07-13 ENCOUNTER — Emergency Department (HOSPITAL_COMMUNITY): Payer: Medicare Other

## 2016-07-13 ENCOUNTER — Observation Stay (HOSPITAL_BASED_OUTPATIENT_CLINIC_OR_DEPARTMENT_OTHER): Payer: Medicare Other

## 2016-07-13 ENCOUNTER — Encounter (HOSPITAL_COMMUNITY): Payer: Medicare Other

## 2016-07-13 ENCOUNTER — Inpatient Hospital Stay (HOSPITAL_COMMUNITY)
Admission: EM | Admit: 2016-07-13 | Discharge: 2016-07-16 | DRG: 064 | Disposition: A | Discharge status: Skilled Nursing Facility | Payer: Medicare Other | Attending: Internal Medicine | Admitting: Internal Medicine

## 2016-07-13 ENCOUNTER — Encounter (HOSPITAL_COMMUNITY): Payer: Self-pay

## 2016-07-13 ENCOUNTER — Observation Stay (HOSPITAL_COMMUNITY): Payer: Medicare Other

## 2016-07-13 DIAGNOSIS — Z809 Family history of malignant neoplasm, unspecified: Secondary | ICD-10-CM

## 2016-07-13 DIAGNOSIS — E860 Dehydration: Secondary | ICD-10-CM | POA: Diagnosis present

## 2016-07-13 DIAGNOSIS — I63312 Cerebral infarction due to thrombosis of left middle cerebral artery: Secondary | ICD-10-CM

## 2016-07-13 DIAGNOSIS — R471 Dysarthria and anarthria: Secondary | ICD-10-CM | POA: Diagnosis present

## 2016-07-13 DIAGNOSIS — I6789 Other cerebrovascular disease: Secondary | ICD-10-CM

## 2016-07-13 DIAGNOSIS — I69322 Dysarthria following cerebral infarction: Secondary | ICD-10-CM

## 2016-07-13 DIAGNOSIS — J690 Pneumonitis due to inhalation of food and vomit: Secondary | ICD-10-CM | POA: Diagnosis present

## 2016-07-13 DIAGNOSIS — E119 Type 2 diabetes mellitus without complications: Secondary | ICD-10-CM

## 2016-07-13 DIAGNOSIS — R1319 Other dysphagia: Secondary | ICD-10-CM | POA: Diagnosis not present

## 2016-07-13 DIAGNOSIS — R1310 Dysphagia, unspecified: Secondary | ICD-10-CM

## 2016-07-13 DIAGNOSIS — I639 Cerebral infarction, unspecified: Secondary | ICD-10-CM | POA: Diagnosis not present

## 2016-07-13 DIAGNOSIS — Z66 Do not resuscitate: Secondary | ICD-10-CM | POA: Diagnosis present

## 2016-07-13 DIAGNOSIS — R262 Difficulty in walking, not elsewhere classified: Secondary | ICD-10-CM | POA: Diagnosis present

## 2016-07-13 DIAGNOSIS — I69321 Dysphasia following cerebral infarction: Secondary | ICD-10-CM

## 2016-07-13 DIAGNOSIS — I248 Other forms of acute ischemic heart disease: Secondary | ICD-10-CM | POA: Diagnosis present

## 2016-07-13 DIAGNOSIS — Z7984 Long term (current) use of oral hypoglycemic drugs: Secondary | ICD-10-CM

## 2016-07-13 DIAGNOSIS — Z7902 Long term (current) use of antithrombotics/antiplatelets: Secondary | ICD-10-CM

## 2016-07-13 DIAGNOSIS — E1151 Type 2 diabetes mellitus with diabetic peripheral angiopathy without gangrene: Secondary | ICD-10-CM | POA: Diagnosis present

## 2016-07-13 DIAGNOSIS — E784 Other hyperlipidemia: Secondary | ICD-10-CM | POA: Diagnosis not present

## 2016-07-13 DIAGNOSIS — IMO0002 Reserved for concepts with insufficient information to code with codable children: Secondary | ICD-10-CM

## 2016-07-13 DIAGNOSIS — E785 Hyperlipidemia, unspecified: Secondary | ICD-10-CM | POA: Diagnosis present

## 2016-07-13 DIAGNOSIS — D72829 Elevated white blood cell count, unspecified: Secondary | ICD-10-CM | POA: Diagnosis present

## 2016-07-13 DIAGNOSIS — Z79899 Other long term (current) drug therapy: Secondary | ICD-10-CM

## 2016-07-13 DIAGNOSIS — I69391 Dysphagia following cerebral infarction: Secondary | ICD-10-CM

## 2016-07-13 DIAGNOSIS — Z7982 Long term (current) use of aspirin: Secondary | ICD-10-CM

## 2016-07-13 DIAGNOSIS — I10 Essential (primary) hypertension: Secondary | ICD-10-CM | POA: Diagnosis present

## 2016-07-13 LAB — CBC WITH DIFFERENTIAL/PLATELET
Basophils Absolute: 0 10*3/uL (ref 0.0–0.1)
Basophils Relative: 0 %
EOS ABS: 0 10*3/uL (ref 0.0–0.7)
Eosinophils Relative: 0 %
HEMATOCRIT: 37.5 % — AB (ref 39.0–52.0)
HEMOGLOBIN: 12.3 g/dL — AB (ref 13.0–17.0)
LYMPHS ABS: 0.6 10*3/uL — AB (ref 0.7–4.0)
LYMPHS PCT: 4 %
MCH: 30.1 pg (ref 26.0–34.0)
MCHC: 32.8 g/dL (ref 30.0–36.0)
MCV: 91.9 fL (ref 78.0–100.0)
MONOS PCT: 6 %
Monocytes Absolute: 0.9 10*3/uL (ref 0.1–1.0)
NEUTROS PCT: 90 %
Neutro Abs: 13.7 10*3/uL — ABNORMAL HIGH (ref 1.7–7.7)
Platelets: 182 10*3/uL (ref 150–400)
RBC: 4.08 MIL/uL — ABNORMAL LOW (ref 4.22–5.81)
RDW: 12.5 % (ref 11.5–15.5)
WBC: 15.2 10*3/uL — ABNORMAL HIGH (ref 4.0–10.5)

## 2016-07-13 LAB — COMPREHENSIVE METABOLIC PANEL
ALT: 12 U/L — ABNORMAL LOW (ref 17–63)
ANION GAP: 8 (ref 5–15)
AST: 21 U/L (ref 15–41)
Albumin: 3.3 g/dL — ABNORMAL LOW (ref 3.5–5.0)
Alkaline Phosphatase: 62 U/L (ref 38–126)
BILIRUBIN TOTAL: 0.5 mg/dL (ref 0.3–1.2)
BUN: 22 mg/dL — ABNORMAL HIGH (ref 6–20)
CO2: 28 mmol/L (ref 22–32)
CREATININE: 0.88 mg/dL (ref 0.61–1.24)
Calcium: 9.4 mg/dL (ref 8.9–10.3)
Chloride: 105 mmol/L (ref 101–111)
Glucose, Bld: 167 mg/dL — ABNORMAL HIGH (ref 65–99)
Potassium: 4.2 mmol/L (ref 3.5–5.1)
SODIUM: 141 mmol/L (ref 135–145)
TOTAL PROTEIN: 6.4 g/dL — AB (ref 6.5–8.1)

## 2016-07-13 LAB — STREP PNEUMONIAE URINARY ANTIGEN: Strep Pneumo Urinary Antigen: NEGATIVE

## 2016-07-13 LAB — URINALYSIS, ROUTINE W REFLEX MICROSCOPIC
BACTERIA UA: NONE SEEN
Bilirubin Urine: NEGATIVE
GLUCOSE, UA: 50 mg/dL — AB
Hgb urine dipstick: NEGATIVE
Ketones, ur: NEGATIVE mg/dL
Leukocytes, UA: NEGATIVE
Nitrite: NEGATIVE
PH: 8 (ref 5.0–8.0)
Protein, ur: 30 mg/dL — AB
SQUAMOUS EPITHELIAL / LPF: NONE SEEN
Specific Gravity, Urine: 1.021 (ref 1.005–1.030)

## 2016-07-13 LAB — GLUCOSE, CAPILLARY
GLUCOSE-CAPILLARY: 120 mg/dL — AB (ref 65–99)
GLUCOSE-CAPILLARY: 105 mg/dL — AB (ref 65–99)
GLUCOSE-CAPILLARY: 142 mg/dL — AB (ref 65–99)
Glucose-Capillary: 106 mg/dL — ABNORMAL HIGH (ref 65–99)

## 2016-07-13 LAB — PROTIME-INR
INR: 1.04
PROTHROMBIN TIME: 13.6 s (ref 11.4–15.2)

## 2016-07-13 LAB — TROPONIN I
TROPONIN I: 0.04 ng/mL — AB (ref <0.03)
Troponin I: 0.04 ng/mL (ref <0.03)

## 2016-07-13 LAB — ECHOCARDIOGRAM COMPLETE

## 2016-07-13 LAB — HIV ANTIBODY (ROUTINE TESTING W REFLEX): HIV Screen 4th Generation wRfx: NONREACTIVE

## 2016-07-13 LAB — PROCALCITONIN: Procalcitonin: 0.1 ng/mL

## 2016-07-13 MED ORDER — STROKE: EARLY STAGES OF RECOVERY BOOK
Freq: Once | Status: AC
  Administered 2016-07-13: 14:00:00
  Filled 2016-07-13 (×2): qty 1

## 2016-07-13 MED ORDER — ASPIRIN 325 MG PO TABS
325.0000 mg | ORAL_TABLET | Freq: Every day | ORAL | Status: DC
  Administered 2016-07-14 – 2016-07-16 (×3): 325 mg via ORAL
  Filled 2016-07-13 (×4): qty 1

## 2016-07-13 MED ORDER — SODIUM CHLORIDE 0.9 % IV SOLN
3.0000 g | Freq: Three times a day (TID) | INTRAVENOUS | Status: DC
  Administered 2016-07-13 – 2016-07-15 (×6): 3 g via INTRAVENOUS
  Filled 2016-07-13 (×7): qty 3

## 2016-07-13 MED ORDER — ENOXAPARIN SODIUM 30 MG/0.3ML ~~LOC~~ SOLN
30.0000 mg | SUBCUTANEOUS | Status: DC
  Administered 2016-07-13 – 2016-07-15 (×3): 30 mg via SUBCUTANEOUS
  Filled 2016-07-13 (×3): qty 0.3

## 2016-07-13 MED ORDER — SODIUM CHLORIDE 0.9 % IV SOLN
INTRAVENOUS | Status: DC
  Administered 2016-07-13 (×2): via INTRAVENOUS

## 2016-07-13 MED ORDER — DEXTROSE 5 % IV SOLN: 500.0000 mg | INTRAVENOUS | Status: DC

## 2016-07-13 MED ORDER — DEXTROSE 5 % IV SOLN: 1.0000 g | INTRAVENOUS | Status: DC

## 2016-07-13 MED ORDER — ACETAMINOPHEN 325 MG PO TABS: 650.0000 mg | ORAL_TABLET | ORAL | Status: DC | PRN

## 2016-07-13 MED ORDER — DEXTROSE 5 % IV SOLN: 1.0000 g | Freq: Once | INTRAVENOUS | Status: DC

## 2016-07-13 MED ORDER — ACETAMINOPHEN 650 MG RE SUPP: 650.0000 mg | RECTAL | Status: DC | PRN

## 2016-07-13 MED ORDER — ASPIRIN 300 MG RE SUPP
300.0000 mg | Freq: Every day | RECTAL | Status: DC
  Administered 2016-07-13: 300 mg via RECTAL
  Filled 2016-07-13: qty 1

## 2016-07-13 MED ORDER — ACETAMINOPHEN 160 MG/5ML PO SOLN: 650.0000 mg | ORAL | Status: DC | PRN

## 2016-07-13 MED ORDER — INSULIN ASPART 100 UNIT/ML ~~LOC~~ SOLN
0.0000 [IU] | SUBCUTANEOUS | Status: DC
  Administered 2016-07-13 – 2016-07-14 (×2): 1 [IU] via SUBCUTANEOUS

## 2016-07-13 NOTE — Evaluation (Cosign Needed)
Clinical/Bedside Swallow Evaluation Patient Details  Name: Clinton [name] MRN: [id number] Date of Birth: [birth year]  Today's Date: 07/13/2016 Time: SLP Start Time (ACUTE ONLY): 61 SLP Stop Time (ACUTE ONLY): 1329 SLP Time Calculation (min) (ACUTE ONLY): 20 min  Past Medical History:  Past Medical History:  Diagnosis Date  . Diabetes mellitus without complication ([place])   . Hypertension   . Stroke [facility])    Past Surgical History:  Past Surgical History:  Procedure Laterality Date  . CARDIAC CATHETERIZATION    . 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 Wife reported that with previous stroke he presented with dysarthria and dysphagia and at one point was on a special diet with thickened liquids but has been on regular diet with thin liquids for "quite some time". The wife has noticed over the past 1 year progressive functional decline including difficulties with thin liquids that have worsened over the past week as well as significant worsening ofdysarthria over the past one week. He has had an unsteady gait and he has fallen at least once. She has noticed a wet sounding cough for at least one week. CXR showed mild bibasilar airspace opacities may reflect atelectasis or possibly mild pneumonia. MRI of brain showed subcentimeter area of acute infarction involving the superior lentiform nucleus and periventricular white matter, nonhemorrhagic. Atrophy and small vessel disease, multiple chronic lacunar infarcts, widespread microbleeds, similar to 2015.   Assessment / Plan / Recommendation Clinical Impression  Mr. Granado was pleasant, alert, and oriented. He and his wife reported a hx of dysphagia and stated that he continues to have difficulties with thin liquids, received ST previously, and was on thickened liquids (been off thickened liquids for over  a year). Oral motor assessment was WFL-noted dysarthria with low intensity and reduced intelligbility (imprecise articulation). Pt presently appears to be aspirating with thin liquids as evidenced by immediate and delayed coughing. Discussed options with pt and wife who do not wish to have liquids thickened, understandably given pt's age/desire for quality of life. Pt and wife were educated re: aspiration risk and pna. Skilled intervention with chin tuck and thin liquids intermittently reduced intensity and frequency of coughing. Water is the safest liquid in conjunction with clean oral cavity and explained this would be the safest option (pt also states he enjoys his morning coffee). Recommended regular solids, thin liquids (chin tuck) meds whole in puree. ST will f/u briefly for treatment to check compliance with chin tuck and further education re: diet texture/liquids. SLP Visit Diagnosis: Dysphagia, pharyngeal phase (R13.13)    Aspiration Risk  Moderate aspiration risk;Severe aspiration risk    Diet Recommendation Regular;Thin liquid   Liquid Administration via: Cup;No straw Medication Administration: Whole meds with puree Supervision: Patient able to self feed;Full supervision/cueing for compensatory strategies Compensations: Chin tuck;Slow rate;Small sips/bites Postural Changes: Seated upright at 90 degrees    Other  Recommendations Oral Care Recommendations: Oral care BID   Follow up Recommendations Other (comment) (TBD)      Frequency and Duration min 2x/week  2 weeks       Prognosis Prognosis for Safe Diet Advancement:  (fair-good)      Swallow Study   General HPI: Ptis a 83 y.o.malewith medical history significant for 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 Wife reported that with previous stroke he presented with dysarthria  and dysphagia and at one point was on a special diet with thickened liquids but has  been on regular diet with thin liquids for "quite some time". The wife has noticed over the past 1 year progressive functional decline including difficulties with thin liquids that have worsened over the past week as well as significant worsening ofdysarthria over the past one week. He has had an unsteady gait and he has fallen at least once. She has noticed a wet sounding cough for at least one week. CXR showed mild bibasilar airspace opacities may reflect atelectasis or possibly mild pneumonia. MRI of brain showed subcentimeter area of acute infarction involving the superior lentiform nucleus and periventricular white matter, nonhemorrhagic. Atrophy and small vessel disease, multiple chronic lacunar infarcts, widespread microbleeds, similar to 2015. Type of Study: Bedside Swallow Evaluation Previous Swallow Assessment: MBS 01/02/2014 Diet Prior to this Study: NPO Temperature Spikes Noted: No Respiratory Status: Room air History of Recent Intubation: No Behavior/Cognition: Alert;Cooperative;Pleasant mood Oral Cavity Assessment: Within Functional Limits Oral Care Completed by SLP: No Oral Cavity - Dentition: Poor condition;Other (Comment) (natural dentition-poor condition) Vision: Functional for self-feeding Self-Feeding Abilities: Able to feed self Patient Positioning: Upright in bed Baseline Vocal Quality: Low vocal intensity;Hoarse Volitional Cough: Weak Volitional Swallow: Able to elicit    Oral/Motor/Sensory Function Overall Oral Motor/Sensory Function: Within functional limits   Ice Chips Ice chips: Not tested   Thin Liquid Thin Liquid: Impaired Presentation: Cup Oral Phase Impairments: Other (comment) (none) Oral Phase Functional Implications: Other (comment) (none) Pharyngeal  Phase Impairments: Cough - Immediate;Cough - Delayed (dyscoordinated, tended to maintain laryngeal elevation)    Nectar Thick Nectar Thick Liquid: Not tested   Honey Thick Honey Thick Liquid: Not tested    Puree Puree: Not tested   Solid   GO   Solid: Within functional limits Presentation: [place] , Student-SLP 07/13/2016,3:31 PM

## 2016-07-13 NOTE — Progress Notes (Signed)
Pharmacy Antibiotic Note  Clinton [name] is a 83 y.o. male admitted on 07/13/2016 with pneumonia.  Pharmacy has been consulted for unasyn dosing.  Plan: Unasyn 3g IV q8h Monitor culture data, renal function and clinical course     Temp (24hrs), Avg:98.6 F (37 C), Min:98.2 F (36.8 C), Max:98.9 F (37.2 C)   Recent Labs Lab 07/13/16 0353  WBC 15.2*  CREATININE 0.88    CrCl cannot be calculated (Unknown ideal weight.).    No Known Allergies   Andrey Cota. Diona Foley, PharmD, BCPS Clinical Pharmacist [phone] 07/13/2016 8:42 AM

## 2016-07-13 NOTE — Progress Notes (Signed)
  Echocardiogram 2D Echocardiogram has been performed.  Diamond Nickel 07/13/2016, 2:23 PM

## 2016-07-13 NOTE — Consult Note (Signed)
Requesting Physician: Dr. Hal Hope    Chief Complaint: Stroke  History obtained from:  Patient   And chart  HPI:                                                                                                                                         Clinton [name] is an 83 y.o. 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 Wife reports that at previous stroke he presented with dysarthria and dysphagia and at one point was on a special diet with thickened liquids but has been on regular diet with thin liquids for "quite some time". The wife has noticed over the past 1 year progressive functional decline including difficulties with thin liquids that have worsened over the past week as well as significant worsening of dysarthria over the past one week. He has had an unsteady gait and he has fallen at least once. He's had to transition from a cane to a rolling walker. Currently he is very dysarthric.   Date last known well: Unable to determine Time last known well: Unable to determine tPA Given: No: out of window   Past Medical History:  Diagnosis Date  . Diabetes mellitus without complication ([place])   . Hypertension   . Stroke [facility])     Past Surgical History:  Procedure Laterality Date  . CARDIAC CATHETERIZATION    . EYE SURGERY      Family History  Problem Relation Age of Onset  . Cancer Mother    Social History:  reports that he has never smoked. He has never used smokeless tobacco. He reports that he does not drink alcohol or use drugs.  Allergies: No Known Allergies  Medications:                                                                                                                           Prior to Admission:  Prescriptions Prior to Admission  Medication Sig Dispense Refill Last Dose  . atorvastatin (LIPITOR) 10 MG tablet TAKE 1 TABLET BY MOUTH DAILY. 90  tablet 3 07/12/2016 at Unknown time  . clopidogrel (PLAVIX) 75 MG tablet TAKE 1 TABLET BY MOUTH ONCE A DAY 90 tablet 1 07/12/2016 at 1230  . metFORMIN (GLUCOPHAGE) 500 MG tablet Take 500 mg by mouth  daily.    07/12/2016 at Unknown time   Scheduled: .  stroke: mapping our early stages of recovery book   Does not apply Once  . ampicillin-sulbactam (UNASYN) IV  3 g Intravenous Q8H  . aspirin  300 mg Rectal Daily   Or  . aspirin  325 mg Oral Daily  . enoxaparin (LOVENOX) injection  30 mg Subcutaneous Q24H  . insulin aspart  0-9 Units Subcutaneous Q4H    ROS:                                                                                                                                       History obtained from the patient  General ROS: negative for - chills, fatigue, fever, night sweats, weight gain or weight loss Psychological ROS: negative for - behavioral disorder, hallucinations, memory difficulties, mood swings or suicidal ideation Ophthalmic ROS: negative for - blurry vision, double vision, eye pain or loss of vision ENT ROS: negative for - epistaxis, nasal discharge, oral lesions, sore throat, tinnitus or vertigo Allergy and Immunology ROS: negative for - hives or itchy/watery eyes Hematological and Lymphatic ROS: negative for - bleeding problems, bruising or swollen lymph nodes Endocrine ROS: negative for - galactorrhea, hair pattern changes, polydipsia/polyuria or temperature intolerance Respiratory ROS: negative for - cough, hemoptysis, shortness of breath or wheezing Cardiovascular ROS: negative for - chest pain, dyspnea on exertion, edema or irregular heartbeat Gastrointestinal ROS: negative for - abdominal pain, diarrhea, hematemesis, nausea/vomiting or stool incontinence Genito-Urinary ROS: negative for - dysuria, hematuria, incontinence or urinary frequency/urgency Musculoskeletal ROS: negative for - joint swelling or muscular weakness Neurological ROS: as noted in  HPI Dermatological ROS: negative for rash and skin lesion changes  Neurologic Examination:                                                                                                      Blood pressure (!) 147/60, pulse 60, temperature 97.3 F (36.3 C), resp. rate 18, SpO2 98 %.  HEENT-  Normocephalic, no lesions, without obvious abnormality.  Normal external eye and conjunctiva.  Normal TM's bilaterally.  Normal auditory canals and external ears. Normal external nose, mucus membranes and septum.  Normal pharynx. Cardiovascular- S1, S2 normal, pulses palpable throughout   Lungs- chest clear, no wheezing, rales, normal symmetric air entry Abdomen- normal findings: bowel sounds normal Extremities- no clubbing Lymph-no adenopathy palpable Musculoskeletal-no joint tenderness, deformity or swelling Skin-warm and dry, no hyperpigmentation, vitiligo, or suspicious lesions  Neurological Examination Mental  Status: Alert, oriented, thought content appropriate.  Speech dysarthric without evidence of aphasia.  Able to follow 3 step commands without difficulty. Cranial Nerves: II:  Visual fields grossly normal,  III,IV, VI: ptosis not present, extra-ocular motions intact bilaterally, pupils equal, round, reactive to light and accommodation V,VII: smile symmetric with right NLF decreased, facial light touch sensation normal bilaterally VIII: hearing normal bilaterally IX,X: uvula rises symmetrically XI: bilateral shoulder shrug XII: midline tongue extension Motor: Right : Upper extremity   5/5    Left:     Upper extremity   5/5  Lower extremity   5/5     Lower extremity   5/5 --weaker in bilateral triceps Tone and bulk:normal tone throughout; no atrophy noted Sensory: Pinprick and light touch intact throughout, bilaterally Deep Tendon Reflexes: 1+ and symmetric throughout Plantars: Right: upgoing   Left: downgoing Cerebellar: normal finger-to-nose,  and normal heel-to-shin test Gait: not  tested       Lab Results: Basic Metabolic Panel:  Recent Labs Lab 07/13/16 0353  NA 141  K 4.2  CL 105  CO2 28  GLUCOSE 167*  BUN 22*  CREATININE 0.88  CALCIUM 9.4    Liver Function Tests:  Recent Labs Lab 07/13/16 0353  AST 21  ALT 12*  ALKPHOS 62  BILITOT 0.5  PROT 6.4*  ALBUMIN 3.3*   No results for input(s): LIPASE, AMYLASE in the last 168 hours. No results for input(s): AMMONIA in the last 168 hours.  CBC:  Recent Labs Lab 07/13/16 0353  WBC 15.2*  NEUTROABS 13.7*  HGB 12.3*  HCT 37.5*  MCV 91.9  PLT 182    Cardiac Enzymes:  Recent Labs Lab 07/13/16 0353 07/13/16 0833  TROPONINI 0.04* 0.04*    Lipid Panel: No results for input(s): CHOL, TRIG, HDL, CHOLHDL, VLDL, LDLCALC in the last 168 hours.  CBG:  Recent Labs Lab 07/13/16 0943  GLUCAP 120*    Microbiology: Results for orders placed or performed during the hospital encounter of 01/22/11  Surgical pcr screen     Status: None   Collection Time: 01/22/11 11:40 AM  Result Value Ref Range Status   MRSA, PCR NEGATIVE NEGATIVE Final   Staphylococcus aureus NEGATIVE NEGATIVE Final    Comment:        The Xpert SA Assay (FDA approved for NASAL specimens only), is one component of a comprehensive surveillance program.  It is not intended to diagnose infection nor to guide or monitor treatment.    Coagulation Studies:  Recent Labs  07/13/16 0353  LABPROT 13.6  INR 1.04    Imaging: Dg Chest 2 View  Result Date: 07/13/2016 CLINICAL DATA:  Status post fall. Altered mental status and slurred speech. Initial encounter. EXAM: CHEST  2 VIEW COMPARISON:  Chest radiograph performed 10/15/2013 FINDINGS: The lungs are well-aerated. Mild bibasilar opacities may reflect atelectasis or possibly mild pneumonia. There is no evidence of pleural effusion or pneumothorax. The heart is borderline normal in size. No acute osseous abnormalities are seen. IMPRESSION: Mild bibasilar airspace  opacities may reflect atelectasis or possibly mild pneumonia. No displaced rib fracture seen. Electronically Signed   By: Garald Balding M.D.   On: 07/13/2016 03:17   Ct Head Wo Contrast  Result Date: 07/13/2016 CLINICAL DATA:  Status post fall while going to bathroom. Hit head. Slurred speech over the past few weeks. Concern for cervical spine injury. Initial encounter. EXAM: CT HEAD WITHOUT CONTRAST CT CERVICAL SPINE WITHOUT CONTRAST TECHNIQUE: Multidetector CT imaging of the head and  cervical spine was performed following the standard protocol without intravenous contrast. Multiplanar CT image reconstructions of the cervical spine were also generated. COMPARISON:  MRI/MRA of the brain performed 10/16/2013, and CT of the head performed 10/15/2013 FINDINGS: CT HEAD FINDINGS Brain: No evidence of acute infarction, hemorrhage, hydrocephalus, extra-axial collection or mass lesion/mass effect. Prominence of the ventricles and sulci reflects moderate cortical volume loss. Mild cerebellar atrophy is noted. Scattered periventricular and subcortical white matter change likely reflects small vessel ischemic microangiopathy. Chronic lacunar infarcts are seen at the basal ganglia bilaterally, and at the left thalamus. A chronic lacunar infarct is also noted at the right corona radiata. The brainstem and fourth ventricle are within normal limits. The cerebral hemispheres demonstrate grossly normal gray-white differentiation. No mass effect or midline shift is seen. Vascular: No hyperdense vessel or unexpected calcification. Skull: There is no evidence of fracture; visualized osseous structures are unremarkable in appearance. Sinuses/Orbits: The orbits are within normal limits. A mucus retention cyst or polyp is noted at the right maxillary sinus. The remaining paranasal sinuses and mastoid air cells are well-aerated. Other: No significant soft tissue abnormalities are seen. CT CERVICAL SPINE FINDINGS Alignment: Normal.  Skull base and vertebrae: No acute fracture. No primary bone lesion or focal pathologic process. Degenerative change is noted about the dens. Soft tissues and spinal canal: No prevertebral fluid or swelling. No visible canal hematoma. Disc levels: Minimal disc space narrowing is noted at multiple levels along the cervical spine, with scattered anterior and posterior disc osteophyte complexes, and mild underlying facet disease. Upper chest: Mild scarring is noted at the lung apices. The thyroid gland is diminutive and grossly unremarkable in appearance. Scattered calcification is seen at the carotid bifurcations bilaterally. Other: No additional soft tissue abnormalities are seen. IMPRESSION: 1. No evidence of traumatic intracranial injury or fracture. 2. No evidence of fracture or subluxation along the cervical spine. 3. Moderate cortical volume loss and scattered small vessel ischemic microangiopathy. 4. Chronic lacunar infarcts at the basal ganglia bilaterally, at the left thalamus, and at the right corona radiata. 5. Mild degenerative change along the cervical spine. 6. Mucus retention cyst or polyp at the right maxillary sinus. 7. Mild scarring at the lung apices. 8. Scattered calcification at the carotid bifurcations bilaterally. Carotid ultrasound would be helpful for further evaluation, when and as deemed clinically appropriate. Electronically Signed   By: Garald Balding M.D.   On: 07/13/2016 04:09   Ct Cervical Spine Wo Contrast  Result Date: 07/13/2016 CLINICAL DATA:  Status post fall while going to bathroom. Hit head. Slurred speech over the past few weeks. Concern for cervical spine injury. Initial encounter. EXAM: CT HEAD WITHOUT CONTRAST CT CERVICAL SPINE WITHOUT CONTRAST TECHNIQUE: Multidetector CT imaging of the head and cervical spine was performed following the standard protocol without intravenous contrast. Multiplanar CT image reconstructions of the cervical spine were also generated.  COMPARISON:  MRI/MRA of the brain performed 10/16/2013, and CT of the head performed 10/15/2013 FINDINGS: CT HEAD FINDINGS Brain: No evidence of acute infarction, hemorrhage, hydrocephalus, extra-axial collection or mass lesion/mass effect. Prominence of the ventricles and sulci reflects moderate cortical volume loss. Mild cerebellar atrophy is noted. Scattered periventricular and subcortical white matter change likely reflects small vessel ischemic microangiopathy. Chronic lacunar infarcts are seen at the basal ganglia bilaterally, and at the left thalamus. A chronic lacunar infarct is also noted at the right corona radiata. The brainstem and fourth ventricle are within normal limits. The cerebral hemispheres demonstrate grossly normal gray-white differentiation.  No mass effect or midline shift is seen. Vascular: No hyperdense vessel or unexpected calcification. Skull: There is no evidence of fracture; visualized osseous structures are unremarkable in appearance. Sinuses/Orbits: The orbits are within normal limits. A mucus retention cyst or polyp is noted at the right maxillary sinus. The remaining paranasal sinuses and mastoid air cells are well-aerated. Other: No significant soft tissue abnormalities are seen. CT CERVICAL SPINE FINDINGS Alignment: Normal. Skull base and vertebrae: No acute fracture. No primary bone lesion or focal pathologic process. Degenerative change is noted about the dens. Soft tissues and spinal canal: No prevertebral fluid or swelling. No visible canal hematoma. Disc levels: Minimal disc space narrowing is noted at multiple levels along the cervical spine, with scattered anterior and posterior disc osteophyte complexes, and mild underlying facet disease. Upper chest: Mild scarring is noted at the lung apices. The thyroid gland is diminutive and grossly unremarkable in appearance. Scattered calcification is seen at the carotid bifurcations bilaterally. Other: No additional soft tissue  abnormalities are seen. IMPRESSION: 1. No evidence of traumatic intracranial injury or fracture. 2. No evidence of fracture or subluxation along the cervical spine. 3. Moderate cortical volume loss and scattered small vessel ischemic microangiopathy. 4. Chronic lacunar infarcts at the basal ganglia bilaterally, at the left thalamus, and at the right corona radiata. 5. Mild degenerative change along the cervical spine. 6. Mucus retention cyst or polyp at the right maxillary sinus. 7. Mild scarring at the lung apices. 8. Scattered calcification at the carotid bifurcations bilaterally. Carotid ultrasound would be helpful for further evaluation, when and as deemed clinically appropriate. Electronically Signed   By: Garald Balding M.D.   On: 07/13/2016 04:09   Mr Brain Wo Contrast  Result Date: 07/13/2016 CLINICAL DATA:  Status post fall prior to admission. Dizziness. Slurred speech for weeks. Stroke risk factors include diabetes, hypertension, and previous stroke. EXAM: MRI HEAD WITHOUT CONTRAST TECHNIQUE: Multiplanar, multiecho pulse sequences of the brain and surrounding structures were obtained without intravenous contrast. COMPARISON:  CT head 07/13/2016.  MR head 10/16/2013. FINDINGS: Brain: Subcentimeter area of acute infarction involves the LEFT lentiform nucleus superiorly, extending to the periventricular white matter. No acute hemorrhage, mass lesion, or extra-axial fluid. Advanced atrophy. Hydrocephalus ex vacuo. Extensive chronic microvascular ischemic change. Prominent perivascular spaces reflective of hypertension. Widespread areas of chronic lacunar infarction involving the brainstem, deep nuclei, and periventricular white matter, similar to priors. 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 They appear  more numerous/larger on today's exam compared with priors due to today's study performed on 3T scanner. Vascular: Normal flow voids. Skull and upper cervical spine: Normal marrow signal. Extensive pannus projects dorsally from the C1-C2 articulation, narrowing the cervicomedullary junction. Upper cervical cord compression is not established. Sinuses/Orbits: No acute findings. BILATERAL cataract extraction. RIGHT maxillary sinus retention cyst. Other: None. Compared with recent CT, the acute infarct is difficult to visualize. Chronic ischemic changes are similar to 2015. IMPRESSION: Subcentimeter area of acute infarction involving the superior lentiform nucleus and periventricular white matter, nonhemorrhagic. Atrophy and small vessel disease, multiple chronic lacunar infarcts, widespread microbleeds, similar to 2015. Electronically Signed   By: Staci Righter M.D.   On: 07/13/2016 07:01       Assessment and plan discussed with with attending physician and they are in agreement.    Etta Quill PA-C [facility] [phone]  07/13/2016, 10:36  AM   Assessment: 83 y.o. male with hypertension and diabetes and likely small vessel ischemic stroke, he is undergoing a workup.  Stroke Risk Factors - diabetes mellitus and hypertension  1. HgbA1c, fasting lipid panel 2. Frequent neuro checks 3. Echocardiogram 4. Carotid dopplers 5. Prophylactic therapy-Antiplatelet med: Aspirin - dose 325mg  PO or 300mg  PR 6. Risk factor modification 7. Telemetry monitoring 8. PT consult, OT consult, Speech consult 9. please page stroke NP  Or  PA  Or MD  from 8am -4 pm starting 2/28 as this patient will be followed by the stroke team at this point.   You can look them up on [email or web address]

## 2016-07-13 NOTE — H&P (Signed)
History and Physical    Clinton [name] [id number] DOB: 05/01/1924 DOA: 07/13/2016   PCP: Carlena Sax, MD   Patient coming from/Resides with: Private residence/wife  Admission status: Observation/telemetry -it may be medically necessary to stay a minimum 2 midnights to rule out impending and/or unexpected changes in physiologic status that may differ from initial evaluation performed in the ER and/or at time of admission therefore consider reevaluation of admission status in 24 hours.   Chief Complaint: Dysarthria and fall  HPI: Clinton [name] is a 83 y.o. 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 Wife reports that at previous stroke he presented with dysarthria and dysphagia and at one point was on a special diet with thickened liquids but has been on regular diet with thin liquids for "quite some time". The wife has noticed over the past 1 year progressive functional decline including difficulties with thin liquids that have worsened over the past week as well as significant worsening of dysarthria over the past one week. He has had an unsteady gait and he has fallen at least once. He's had to transition from a cane to a rolling walker. He has not had any fevers or chills or sick contacts. She has noticed a wet sounding cough for at least one week. Due to presentation one week after onset of initial symptoms could stroke was not initiated upon presentation to ER  ED Course:  Vital Signs: BP 115/65   Pulse 75   Temp 98.9 F (37.2 C) (Oral)   Resp 25   SpO2 95%  2 view CXR: Mild bibasilar airspace opacities reflective of either atelectasis or pneumonia CT head without contrast: Chronic lacunar infarcts basal ganglia bilaterally, left thalamus and right corona radiata CT cervical spine: Scattered calcifications at the carotid bifurcations bilaterally, no evidence of fracture or  subluxation along the cervical spine MR brain without contrast: Subcentimeter area of acute infarction involving the superior lentiform nucleus and periventricular white matter which is nonhemorrhagic; atrophy and small vessel disease with multiple chronic lacunar infarcts with widespread microbleed similar to 2015 Lab data: Sodium 141, potassium 4.2, chloride 15, CO2 28, glucose 167, BUN 22, creatinine 0.88, LFTs not elevated, troponin 0.04, white count 15,200 neutrophils 90% and absolute neutrophils 13.7%, hemoglobin 12.3, platelets 182,000, coags normal, urinalysis abnormal with 50 of glucose Medications and treatments: None  Review of Systems:  In addition to the HPI above,  No Fever-chills, myalgias or other constitutional symptoms No Headache, changes with Vision or hearing, new weakness, tingling, numbness in any extremity, dizziness, tremors or seizure activity No problems swallowing food or Liquids, indigestion/reflux, choking or coughing while eating, abdominal pain with or after eating No Chest pain, Shortness of Breath, palpitations, orthopnea or DOE No Abdominal pain, N/V, melena,hematochezia, dark tarry stools, constipation No dysuria, malodorous urine, hematuria or flank pain No new skin rashes, lesions, masses or bruises, No new joint pains, aches, swelling or redness No recent unintentional weight gain or loss No polyuria, polydypsia or polyphagia   Past Medical History:  Diagnosis Date  . Diabetes mellitus without complication ([place])   . Hypertension   . Stroke [facility])     Past Surgical History:  Procedure Laterality Date  . CARDIAC CATHETERIZATION    . EYE SURGERY      Social History   Social History  . Marital status: Married    Spouse name: N/A  . Number of children: N/A  .  Years of education: N/A   Occupational History  . Not on file.   Social History Main Topics  . Smoking status: Never Smoker  . Smokeless tobacco: Never Used  . Alcohol use No      Comment: 09/03/14 one glass of wine weekly  . Drug use: No  . Sexual activity: Not on file   Other Topics Concern  . Not on file   Social History Narrative  . No narrative on file    Mobility: Rolling walker Work history: Not obtained   No Known Allergies  Family History  Problem Relation Age of Onset  . Cancer Mother       Prior to Admission medications   Medication Sig Start Date End Date Taking? Authorizing Provider  atorvastatin (LIPITOR) 10 MG tablet TAKE 1 TABLET BY MOUTH DAILY. 10/01/14  Yes Rosalin Hawking, MD  clopidogrel (PLAVIX) 75 MG tablet TAKE 1 TABLET BY MOUTH ONCE A DAY 04/01/15  Yes Rosalin Hawking, MD  metFORMIN (GLUCOPHAGE) 500 MG tablet Take 500 mg by mouth daily.    Yes Historical Provider, MD    Physical Exam: Vitals:   07/13/16 0242 07/13/16 0445 07/13/16 0500 07/13/16 0545  BP: 142/88 135/80 143/68 115/65  Pulse: 100 76 82 75  Resp: 22 23 22 25   Temp: 98.9 F (37.2 C)     TempSrc: Oral     SpO2: 98% 95% 95% 95%      Constitutional: NAD, calm, comfortable Eyes: PERRL, lids and conjunctivae normal ENMT: Mucous membranes are moist. Posterior pharynx clear of any exudate or lesions.Normal dentition.  Neck: normal, supple, no masses, no thyromegaly Respiratory: Bilateral crackles mid fields down. Audible wet sounding cough. Normal respiratory effort. No accessory muscle use.  Cardiovascular: Regular rate and rhythm, no murmurs / rubs / gallops. No extremity edema. 2+ pedal pulses. No carotid bruits.  Abdomen: no tenderness, no masses palpated. No hepatosplenomegaly. Bowel sounds positive.  Musculoskeletal: no clubbing / cyanosis. No joint deformity upper and lower extremities. Good ROM, no contractures. Normal muscle tone.  Skin: no rashes, lesions, ulcers. No induration Neurologic: CN 2-12 grossly intact. Significant dysarthria difficulty forming words. Sensation intact, DTR normal. Strength 4-5/5 x all 4 extremities. Some difficulty with heel to shin  maneuver bilaterally Psychiatric: Alert, due to degree of dysarthria unable to accurately assess mentation -appears to have normal mood with patient noted to be smiling appropriately in relation to conversation and room    Labs on Admission: I have personally reviewed following labs and imaging studies  CBC:  Recent Labs Lab 07/13/16 0353  WBC 15.2*  NEUTROABS 13.7*  HGB 12.3*  HCT 37.5*  MCV 91.9  PLT [id number]   Basic Metabolic Panel:  Recent Labs Lab 07/13/16 0353  NA 141  K 4.2  CL 105  CO2 28  GLUCOSE 167*  BUN 22*  CREATININE 0.88  CALCIUM 9.4   GFR: CrCl cannot be calculated (Unknown ideal weight.). Liver Function Tests:  Recent Labs Lab 07/13/16 0353  AST 21  ALT 12*  ALKPHOS 62  BILITOT 0.5  PROT 6.4*  ALBUMIN 3.3*   No results for input(s): LIPASE, AMYLASE in the last 168 hours. No results for input(s): AMMONIA in the last 168 hours. Coagulation Profile:  Recent Labs Lab 07/13/16 0353  INR 1.04   Cardiac Enzymes:  Recent Labs Lab 07/13/16 0353  TROPONINI 0.04*   BNP (last 3 results) No results for input(s): PROBNP in the last 8760 hours. HbA1C: No results for input(s): HGBA1C in the  last 72 hours. CBG: No results for input(s): GLUCAP in the last 168 hours. Lipid Profile: No results for input(s): CHOL, HDL, LDLCALC, TRIG, CHOLHDL, LDLDIRECT in the last 72 hours. Thyroid Function Tests: No results for input(s): TSH, T4TOTAL, FREET4, T3FREE, THYROIDAB in the last 72 hours. Anemia Panel: No results for input(s): VITAMINB12, FOLATE, FERRITIN, TIBC, IRON, RETICCTPCT in the last 72 hours. Urine analysis:    Component Value Date/Time   COLORURINE YELLOW 07/13/2016 0521   APPEARANCEUR CLEAR 07/13/2016 0521   LABSPEC 1.021 07/13/2016 0521   PHURINE 8.0 07/13/2016 0521   GLUCOSEU 50 (A) 07/13/2016 0521   HGBUR NEGATIVE 07/13/2016 0521   BILIRUBINUR NEGATIVE 07/13/2016 0521   KETONESUR NEGATIVE 07/13/2016 0521   PROTEINUR 30 (A) 07/13/2016  0521   UROBILINOGEN 0.2 10/15/2013 1913   NITRITE NEGATIVE 07/13/2016 0521   LEUKOCYTESUR NEGATIVE 07/13/2016 0521   Sepsis Labs: @LABRCNTIP (procalcitonin:4,lacticidven:4) )No results found for this or any previous visit (from the past 240 hour(s)).   Radiological Exams on Admission: Dg Chest 2 View  Result Date: 07/13/2016 CLINICAL DATA:  Status post fall. Altered mental status and slurred speech. Initial encounter. EXAM: CHEST  2 VIEW COMPARISON:  Chest radiograph performed 10/15/2013 FINDINGS: The lungs are well-aerated. Mild bibasilar opacities may reflect atelectasis or possibly mild pneumonia. There is no evidence of pleural effusion or pneumothorax. The heart is borderline normal in size. No acute osseous abnormalities are seen. IMPRESSION: Mild bibasilar airspace opacities may reflect atelectasis or possibly mild pneumonia. No displaced rib fracture seen. Electronically Signed   By: Garald Balding M.D.   On: 07/13/2016 03:17   Ct Head Wo Contrast  Result Date: 07/13/2016 CLINICAL DATA:  Status post fall while going to bathroom. Hit head. Slurred speech over the past few weeks. Concern for cervical spine injury. Initial encounter. EXAM: CT HEAD WITHOUT CONTRAST CT CERVICAL SPINE WITHOUT CONTRAST TECHNIQUE: Multidetector CT imaging of the head and cervical spine was performed following the standard protocol without intravenous contrast. Multiplanar CT image reconstructions of the cervical spine were also generated. COMPARISON:  MRI/MRA of the brain performed 10/16/2013, and CT of the head performed 10/15/2013 FINDINGS: CT HEAD FINDINGS Brain: No evidence of acute infarction, hemorrhage, hydrocephalus, extra-axial collection or mass lesion/mass effect. Prominence of the ventricles and sulci reflects moderate cortical volume loss. Mild cerebellar atrophy is noted. Scattered periventricular and subcortical white matter change likely reflects small vessel ischemic microangiopathy. Chronic lacunar  infarcts are seen at the basal ganglia bilaterally, and at the left thalamus. A chronic lacunar infarct is also noted at the right corona radiata. The brainstem and fourth ventricle are within normal limits. The cerebral hemispheres demonstrate grossly normal gray-white differentiation. No mass effect or midline shift is seen. Vascular: No hyperdense vessel or unexpected calcification. Skull: There is no evidence of fracture; visualized osseous structures are unremarkable in appearance. Sinuses/Orbits: The orbits are within normal limits. A mucus retention cyst or polyp is noted at the right maxillary sinus. The remaining paranasal sinuses and mastoid air cells are well-aerated. Other: No significant soft tissue abnormalities are seen. CT CERVICAL SPINE FINDINGS Alignment: Normal. Skull base and vertebrae: No acute fracture. No primary bone lesion or focal pathologic process. Degenerative change is noted about the dens. Soft tissues and spinal canal: No prevertebral fluid or swelling. No visible canal hematoma. Disc levels: Minimal disc space narrowing is noted at multiple levels along the cervical spine, with scattered anterior and posterior disc osteophyte complexes, and mild underlying facet disease. Upper chest: Mild scarring is noted  at the lung apices. The thyroid gland is diminutive and grossly unremarkable in appearance. Scattered calcification is seen at the carotid bifurcations bilaterally. Other: No additional soft tissue abnormalities are seen. IMPRESSION: 1. No evidence of traumatic intracranial injury or fracture. 2. No evidence of fracture or subluxation along the cervical spine. 3. Moderate cortical volume loss and scattered small vessel ischemic microangiopathy. 4. Chronic lacunar infarcts at the basal ganglia bilaterally, at the left thalamus, and at the right corona radiata. 5. Mild degenerative change along the cervical spine. 6. Mucus retention cyst or polyp at the right maxillary sinus. 7. Mild  scarring at the lung apices. 8. Scattered calcification at the carotid bifurcations bilaterally. Carotid ultrasound would be helpful for further evaluation, when and as deemed clinically appropriate. Electronically Signed   By: Garald Balding M.D.   On: 07/13/2016 04:09   Ct Cervical Spine Wo Contrast  Result Date: 07/13/2016 CLINICAL DATA:  Status post fall while going to bathroom. Hit head. Slurred speech over the past few weeks. Concern for cervical spine injury. Initial encounter. EXAM: CT HEAD WITHOUT CONTRAST CT CERVICAL SPINE WITHOUT CONTRAST TECHNIQUE: Multidetector CT imaging of the head and cervical spine was performed following the standard protocol without intravenous contrast. Multiplanar CT image reconstructions of the cervical spine were also generated. COMPARISON:  MRI/MRA of the brain performed 10/16/2013, and CT of the head performed 10/15/2013 FINDINGS: CT HEAD FINDINGS Brain: No evidence of acute infarction, hemorrhage, hydrocephalus, extra-axial collection or mass lesion/mass effect. Prominence of the ventricles and sulci reflects moderate cortical volume loss. Mild cerebellar atrophy is noted. Scattered periventricular and subcortical white matter change likely reflects small vessel ischemic microangiopathy. Chronic lacunar infarcts are seen at the basal ganglia bilaterally, and at the left thalamus. A chronic lacunar infarct is also noted at the right corona radiata. The brainstem and fourth ventricle are within normal limits. The cerebral hemispheres demonstrate grossly normal gray-white differentiation. No mass effect or midline shift is seen. Vascular: No hyperdense vessel or unexpected calcification. Skull: There is no evidence of fracture; visualized osseous structures are unremarkable in appearance. Sinuses/Orbits: The orbits are within normal limits. A mucus retention cyst or polyp is noted at the right maxillary sinus. The remaining paranasal sinuses and mastoid air cells are  well-aerated. Other: No significant soft tissue abnormalities are seen. CT CERVICAL SPINE FINDINGS Alignment: Normal. Skull base and vertebrae: No acute fracture. No primary bone lesion or focal pathologic process. Degenerative change is noted about the dens. Soft tissues and spinal canal: No prevertebral fluid or swelling. No visible canal hematoma. Disc levels: Minimal disc space narrowing is noted at multiple levels along the cervical spine, with scattered anterior and posterior disc osteophyte complexes, and mild underlying facet disease. Upper chest: Mild scarring is noted at the lung apices. The thyroid gland is diminutive and grossly unremarkable in appearance. Scattered calcification is seen at the carotid bifurcations bilaterally. Other: No additional soft tissue abnormalities are seen. IMPRESSION: 1. No evidence of traumatic intracranial injury or fracture. 2. No evidence of fracture or subluxation along the cervical spine. 3. Moderate cortical volume loss and scattered small vessel ischemic microangiopathy. 4. Chronic lacunar infarcts at the basal ganglia bilaterally, at the left thalamus, and at the right corona radiata. 5. Mild degenerative change along the cervical spine. 6. Mucus retention cyst or polyp at the right maxillary sinus. 7. Mild scarring at the lung apices. 8. Scattered calcification at the carotid bifurcations bilaterally. Carotid ultrasound would be helpful for further evaluation, when and as deemed  clinically appropriate. Electronically Signed   By: Garald Balding M.D.   On: 07/13/2016 04:09   Mr Brain Wo Contrast  Result Date: 07/13/2016 CLINICAL DATA:  Status post fall prior to admission. Dizziness. Slurred speech for weeks. Stroke risk factors include diabetes, hypertension, and previous stroke. EXAM: MRI HEAD WITHOUT CONTRAST TECHNIQUE: Multiplanar, multiecho pulse sequences of the brain and surrounding structures were obtained without intravenous contrast. COMPARISON:  CT head  07/13/2016.  MR head 10/16/2013. FINDINGS: Brain: Subcentimeter area of acute infarction involves the LEFT lentiform nucleus superiorly, extending to the periventricular white matter. No acute hemorrhage, mass lesion, or extra-axial fluid. Advanced atrophy. Hydrocephalus ex vacuo. Extensive chronic microvascular ischemic change. Prominent perivascular spaces reflective of hypertension. Widespread areas of chronic lacunar infarction involving the brainstem, deep nuclei, and periventricular white matter, similar to priors. 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 They appear more numerous/larger on today's exam compared with priors due to today's study performed on 3T scanner. Vascular: Normal flow voids. Skull and upper cervical spine: Normal marrow signal. Extensive pannus projects dorsally from the C1-C2 articulation, narrowing the cervicomedullary junction. Upper cervical cord compression is not established. Sinuses/Orbits: No acute findings. BILATERAL cataract extraction. RIGHT maxillary sinus retention cyst. Other: None. Compared with recent CT, the acute infarct is difficult to visualize. Chronic ischemic changes are similar to 2015. IMPRESSION: Subcentimeter area of acute infarction involving the superior lentiform nucleus and periventricular white matter, nonhemorrhagic. Atrophy and small vessel disease, multiple chronic lacunar infarcts, widespread microbleeds, similar to 2015. Electronically Signed   By: Staci Righter M.D.   On: 07/13/2016 07:01    EKG: (Independently reviewed) sinus rhythm with ventricular rate 75 bpm, QTC 460 ms, right bundle branch block, no acute ischemic changes.  Assessment/Plan Principal Problem:   CVA (cerebral vascular accident)  -Patient presents after fall with progressive  issues with dysarthria and dysphagia with MRI revealing positive subcentimeter acute infarct -History of prior CVA 2 years ago with similar symptoms and presentation -Ischemic stroke order set initiated -Echocardiogram -Carotid duplex -NPO-failed bedside swallow evaluation (see below) -SLP/PT/OT evaluation -Lipid panel/HgbA1c -Frequent neurological checks -Was on Plavix prior to admission but since is NPO we'll utilize full dose aspirin PR route -Neurology to see in consultation  Active Problems:   Aspiration pneumonia /history of Dysphagia -Patient presents with history of progressive worsening of dysphagia symptoms with more acute worsening of symptoms in the past 1 week with evidence of stroke -NPO -IVFs -Await recommendations from SLP -Empiric Rocephin and Zithromax   Mild elevation in troponin -No reports of chest pain -Likely demand ischemia in setting of acute CVA as well as aspiration pneumonitis -For completeness of exam cycle troponin    Leukocytosis -Likely secondary to dehydration and acute aspiration pneumonitis -Urinalysis unremarkable -Treat as community-acquired pneumonia (see above) -Blood cultures -Procalcitonin -Sputum culture -Influenza PCR -Urinary strep and Legionella    Diabetes mellitus type 2 in nonobese  -Hold home metformin -CBG q 4hrs while NPO    HLD (hyperlipidemia) -Hold home before while NPO      DVT prophylaxis: Lovenox Code Status: DO NOT RESUSCITATE Family Communication: Wife at bedside  Disposition Plan: Likely to return to previous home environment pending PT/OT evaluation Consults called: Neurology/[place]    Larisha Vencill L. ANP-BC [facility] Pager [phone]   If 7PM-7AM, please contact night-coverage [email or web address] Password [facility]  07/13/2016, 7:35 AM

## 2016-07-13 NOTE — ED Provider Notes (Signed)
Real DEPT Provider Note   CSN: [id number] Arrival date & time: 07/13/16  [id number]  By signing my name below, I, Dyke Brackett, attest that this documentation has been prepared under the direction and in the presence of Ezequiel Essex, MD . Electronically Signed: Dyke Brackett, Scribe. 07/13/2016. 2:48 AM.   History   Chief Complaint Chief Complaint  Patient presents with  . Fall  . Aphasia   HPI ORRIE RAMPLEY is a 83 y.o. male with a history of DM, HTN, stroke, who presents to the Emergency Department for evaluation s/p fall today PTA. Pt states he felt dizzy and fell, striking his head. Per pt, he remembers falling, but states it was "very fast". Pt also states he has had slurred speech for several weeks. No alleviating or modifying factors noted.  He denies any hip pain, headache, abdominal pain, CP, neck pain, or back pain. Pt has no other complaints or symptoms at this time.    The history is provided by the patient. No language interpreter was used.   Past Medical History:  Diagnosis Date  . Diabetes mellitus without complication   . Hypertension   . Stroke     Patient Active Problem List   Diagnosis Date Noted  . 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 Type 2 diabetes mellitus with other circulatory complications [id number]  . Dysphagia 03/04/2014  . CVA (cerebral vascular accident) ([place]) 11/26/2013  . Leukocytosis, unspecified 10/16/2013  . Diabetes mellitus ([place]) 10/15/2013  . CVA (cerebral infarction) 10/15/2013    Past Surgical History:  Procedure Laterality Date  . CARDIAC CATHETERIZATION    . EYE SURGERY        Home Medications    Prior to Admission medications   Medication Sig Start Date End Date Taking? Authorizing Provider  aspirin 81 MG tablet Take 81 mg by mouth daily.    Historical Provider, MD  atorvastatin (LIPITOR) 10 MG tablet TAKE 1 TABLET BY MOUTH DAILY. 10/01/14   Rosalin Hawking, MD  clopidogrel  (PLAVIX) 75 MG tablet TAKE 1 TABLET BY MOUTH ONCE A DAY 04/01/15   Rosalin Hawking, MD  metFORMIN (GLUCOPHAGE) 500 MG tablet Take 500 mg by mouth 2 (two) times daily with a meal.    Historical Provider, MD    Family History Family History  Problem Relation Age of Onset  . Cancer Mother     Social History Social History  Substance Use Topics  . Smoking status: Never Smoker  . Smokeless tobacco: Never Used  . Alcohol use No     Comment: 09/03/14 one glass of wine weekly    Allergies   Patient has no known allergies.   Review of Systems Review of Systems 10 systems reviewed and all are negative for acute change except as noted in the HPI.  Physical Exam Updated Vital Signs There were no vitals taken for this visit.  Physical Exam  Constitutional: He is oriented to person, place, and time. He appears well-developed and well-nourished. No distress.  HENT:  Head: Normocephalic and atraumatic.  Mouth/Throat: Oropharynx is clear and moist. No oropharyngeal exudate.  Eyes: Conjunctivae and EOM are normal. Pupils are equal, round, and reactive to light.  Neck: Normal range of motion. Neck supple.  No meningismus.  Cardiovascular: Normal rate, regular rhythm, normal heart sounds and intact distal pulses.   No murmur heard. Pulmonary/Chest: Effort normal and breath sounds normal. No respiratory distress.  Abdominal: Soft. There is no tenderness. There is no rebound  and no guarding.  Musculoskeletal: Normal range of motion. He exhibits no edema or tenderness.  Neurological: He is alert and oriented to person, place, and time. No cranial nerve deficit. He exhibits normal muscle tone. Coordination normal.   5/5 strength throughout. CN 2-12 intact. Moderately dysarthric speech, no facial droop, no pronator drift. No ataxia on finger to nose.   Skin: Skin is warm.  Psychiatric: He has a normal mood and affect. His behavior is normal.  Nursing note and vitals reviewed.  ED Treatments /  Results  DIAGNOSTIC STUDIES:  Oxygen Saturation is 98% on RA, normal by my interpretation.    COORDINATION OF CARE:  2:38 AM Discussed treatment plan with pt at bedside and pt agreed to plan.   Labs (all labs ordered are listed, but only abnormal results are displayed) Labs Reviewed  CBC WITH DIFFERENTIAL/PLATELET - Abnormal; Notable for the following:       Result Value   WBC 15.2 (*)    RBC 4.08 (*)    Hemoglobin 12.3 (*)    HCT 37.5 (*)    Neutro Abs 13.7 (*)    Lymphs Abs 0.6 (*)    All other components within normal limits  COMPREHENSIVE METABOLIC PANEL - Abnormal; Notable for the following:    Glucose, Bld 167 (*)    BUN 22 (*)    Total Protein 6.4 (*)    Albumin 3.3 (*)    ALT 12 (*)    All other components within normal limits  TROPONIN I - Abnormal; Notable for the following:    Troponin I 0.04 (*)    All other components within normal limits  URINALYSIS, ROUTINE W REFLEX MICROSCOPIC - Abnormal; Notable for the following:    Glucose, UA 50 (*)    Protein, ur 30 (*)    All other components within normal limits  CULTURE, BLOOD (ROUTINE X 2)  CULTURE, BLOOD (ROUTINE X 2)  CULTURE, EXPECTORATED SPUTUM-ASSESSMENT  GRAM STAIN  PROTIME-INR  HIV ANTIBODY (ROUTINE TESTING)  STREP PNEUMONIAE URINARY ANTIGEN  LEGIONELLA PNEUMOPHILA SEROGP 1 UR AG  INFLUENZA PANEL BY PCR (TYPE A & B)  TROPONIN I  PROCALCITONIN    EKG  EKG Interpretation  Date/Time:  Tuesday July 13 2016 03:49:09 EST Ventricular Rate:  75 PR Interval:    QRS Duration: 141 QT Interval:  419 QTC Calculation: 468 R Axis:   -67 Text Interpretation:  Sinus rhythm Ventricular premature complex RBBB and LAFB Baseline wander in lead(s) V3 Artifact No significant change was found Confirmed by Wyvonnia Dusky  MD, Annie Main [id number]) on 07/13/2016 4:30:10 AM Also confirmed by Wyvonnia Dusky  MD, Vence Lalor [phone]), editor [place] CT, Leda Gauze [phone])  on 07/13/2016 7:49:49 AM       Radiology Dg Chest 2 View  Result Date:  07/13/2016 CLINICAL DATA:  Status post fall. Altered mental status and slurred speech. Initial encounter. EXAM: CHEST  2 VIEW COMPARISON:  Chest radiograph performed 10/15/2013 FINDINGS: The lungs are well-aerated. Mild bibasilar opacities may reflect atelectasis or possibly mild pneumonia. There is no evidence of pleural effusion or pneumothorax. The heart is borderline normal in size. No acute osseous abnormalities are seen. IMPRESSION: Mild bibasilar airspace opacities may reflect atelectasis or possibly mild pneumonia. No displaced rib fracture seen. Electronically Signed   By: Garald Balding M.D.   On: 07/13/2016 03:17   Ct Head Wo Contrast  Result Date: 07/13/2016 CLINICAL DATA:  Status post fall while going to bathroom. Hit head. Slurred speech over the past few weeks. Concern  for cervical spine injury. Initial encounter. EXAM: CT HEAD WITHOUT CONTRAST CT CERVICAL SPINE WITHOUT CONTRAST TECHNIQUE: Multidetector CT imaging of the head and cervical spine was performed following the standard protocol without intravenous contrast. Multiplanar CT image reconstructions of the cervical spine were also generated. COMPARISON:  MRI/MRA of the brain performed 10/16/2013, and CT of the head performed 10/15/2013 FINDINGS: CT HEAD FINDINGS Brain: No evidence of acute infarction, hemorrhage, hydrocephalus, extra-axial collection or mass lesion/mass effect. Prominence of the ventricles and sulci reflects moderate cortical volume loss. Mild cerebellar atrophy is noted. Scattered periventricular and subcortical white matter change likely reflects small vessel ischemic microangiopathy. Chronic lacunar infarcts are seen at the basal ganglia bilaterally, and at the left thalamus. A chronic lacunar infarct is also noted at the right corona radiata. The brainstem and fourth ventricle are within normal limits. The cerebral hemispheres demonstrate grossly normal gray-white differentiation. No mass effect or midline shift is seen.  Vascular: No hyperdense vessel or unexpected calcification. Skull: There is no evidence of fracture; visualized osseous structures are unremarkable in appearance. Sinuses/Orbits: The orbits are within normal limits. A mucus retention cyst or polyp is noted at the right maxillary sinus. The remaining paranasal sinuses and mastoid air cells are well-aerated. Other: No significant soft tissue abnormalities are seen. CT CERVICAL SPINE FINDINGS Alignment: Normal. Skull base and vertebrae: No acute fracture. No primary bone lesion or focal pathologic process. Degenerative change is noted about the dens. Soft tissues and spinal canal: No prevertebral fluid or swelling. No visible canal hematoma. Disc levels: Minimal disc space narrowing is noted at multiple levels along the cervical spine, with scattered anterior and posterior disc osteophyte complexes, and mild underlying facet disease. Upper chest: Mild scarring is noted at the lung apices. The thyroid gland is diminutive and grossly unremarkable in appearance. Scattered calcification is seen at the carotid bifurcations bilaterally. Other: No additional soft tissue abnormalities are seen. IMPRESSION: 1. No evidence of traumatic intracranial injury or fracture. 2. No evidence of fracture or subluxation along the cervical spine. 3. Moderate cortical volume loss and scattered small vessel ischemic microangiopathy. 4. Chronic lacunar infarcts at the basal ganglia bilaterally, at the left thalamus, and at the right corona radiata. 5. Mild degenerative change along the cervical spine. 6. Mucus retention cyst or polyp at the right maxillary sinus. 7. Mild scarring at the lung apices. 8. Scattered calcification at the carotid bifurcations bilaterally. Carotid ultrasound would be helpful for further evaluation, when and as deemed clinically appropriate. Electronically Signed   By: Garald Balding M.D.   On: 07/13/2016 04:09   Ct Cervical Spine Wo Contrast  Result Date:  07/13/2016 CLINICAL DATA:  Status post fall while going to bathroom. Hit head. Slurred speech over the past few weeks. Concern for cervical spine injury. Initial encounter. EXAM: CT HEAD WITHOUT CONTRAST CT CERVICAL SPINE WITHOUT CONTRAST TECHNIQUE: Multidetector CT imaging of the head and cervical spine was performed following the standard protocol without intravenous contrast. Multiplanar CT image reconstructions of the cervical spine were also generated. COMPARISON:  MRI/MRA of the brain performed 10/16/2013, and CT of the head performed 10/15/2013 FINDINGS: CT HEAD FINDINGS Brain: No evidence of acute infarction, hemorrhage, hydrocephalus, extra-axial collection or mass lesion/mass effect. Prominence of the ventricles and sulci reflects moderate cortical volume loss. Mild cerebellar atrophy is noted. Scattered periventricular and subcortical white matter change likely reflects small vessel ischemic microangiopathy. Chronic lacunar infarcts are seen at the basal ganglia bilaterally, and at the left thalamus. A chronic lacunar infarct is  also noted at the right corona radiata. The brainstem and fourth ventricle are within normal limits. The cerebral hemispheres demonstrate grossly normal gray-white differentiation. No mass effect or midline shift is seen. Vascular: No hyperdense vessel or unexpected calcification. Skull: There is no evidence of fracture; visualized osseous structures are unremarkable in appearance. Sinuses/Orbits: The orbits are within normal limits. A mucus retention cyst or polyp is noted at the right maxillary sinus. The remaining paranasal sinuses and mastoid air cells are well-aerated. Other: No significant soft tissue abnormalities are seen. CT CERVICAL SPINE FINDINGS Alignment: Normal. Skull base and vertebrae: No acute fracture. No primary bone lesion or focal pathologic process. Degenerative change is noted about the dens. Soft tissues and spinal canal: No prevertebral fluid or swelling.  No visible canal hematoma. Disc levels: Minimal disc space narrowing is noted at multiple levels along the cervical spine, with scattered anterior and posterior disc osteophyte complexes, and mild underlying facet disease. Upper chest: Mild scarring is noted at the lung apices. The thyroid gland is diminutive and grossly unremarkable in appearance. Scattered calcification is seen at the carotid bifurcations bilaterally. Other: No additional soft tissue abnormalities are seen. IMPRESSION: 1. No evidence of traumatic intracranial injury or fracture. 2. No evidence of fracture or subluxation along the cervical spine. 3. Moderate cortical volume loss and scattered small vessel ischemic microangiopathy. 4. Chronic lacunar infarcts at the basal ganglia bilaterally, at the left thalamus, and at the right corona radiata. 5. Mild degenerative change along the cervical spine. 6. Mucus retention cyst or polyp at the right maxillary sinus. 7. Mild scarring at the lung apices. 8. Scattered calcification at the carotid bifurcations bilaterally. Carotid ultrasound would be helpful for further evaluation, when and as deemed clinically appropriate. Electronically Signed   By: Garald Balding M.D.   On: 07/13/2016 04:09   Mr Brain Wo Contrast  Result Date: 07/13/2016 CLINICAL DATA:  Status post fall prior to admission. Dizziness. Slurred speech for weeks. Stroke risk factors include diabetes, hypertension, and previous stroke. EXAM: MRI HEAD WITHOUT CONTRAST TECHNIQUE: Multiplanar, multiecho pulse sequences of the brain and surrounding structures were obtained without intravenous contrast. COMPARISON:  CT head 07/13/2016.  MR head 10/16/2013. FINDINGS: Brain: Subcentimeter area of acute infarction involves the LEFT lentiform nucleus superiorly, extending to the periventricular white matter. No acute hemorrhage, mass lesion, or extra-axial fluid. Advanced atrophy. Hydrocephalus ex vacuo. Extensive chronic microvascular ischemic  change. Prominent perivascular spaces reflective of hypertension. Widespread areas of chronic lacunar infarction involving the brainstem, deep nuclei, and periventricular white matter, similar to priors. 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 They appear more numerous/larger on today's exam compared with priors due to today's study performed on 3T scanner. Vascular: Normal flow voids. Skull and upper cervical spine: Normal marrow signal. Extensive pannus projects dorsally from the C1-C2 articulation, narrowing the cervicomedullary junction. Upper cervical cord compression is not established. Sinuses/Orbits: No acute findings. BILATERAL cataract extraction. RIGHT maxillary sinus retention cyst. Other: None. Compared with recent CT, the acute infarct is difficult to visualize. Chronic ischemic changes are similar to 2015. IMPRESSION: Subcentimeter area of acute infarction involving the superior lentiform nucleus and periventricular white matter, nonhemorrhagic. Atrophy and small vessel disease, multiple chronic lacunar infarcts, widespread microbleeds, similar to 2015. Electronically Signed   By: Staci Righter M.D.   On: 07/13/2016 07:01    Procedures Procedures (including critical  care time)  Medications Ordered in ED Medications - No data to display   Initial Impression / Assessment and Plan / ED Course  I have reviewed the triage vital signs and the nursing notes.  Pertinent labs & imaging results that were available during my care of the patient were reviewed by me and considered in my medical decision making (see chart for details).    Patient presents from home after a fall stumbling in the bathroom hitting his head. No loss of consciousness. Patient denies any pain. Denies any dizziness. Patient  is on Plavix. EMS reports patient has had slurred speech for more than the past week.  Code stroke not activated due to delayed presentation. Patient moving all extremities but does have significant dysarthria.  CT scan is negative for acute infarct or hemorrhage. Wet cough on exam.  Leukocytosis on labs. CXR with basilar infiltrates versus atelectasis  Suspect aspiration pneumonia given cough and progressive speech and swallowing difficulty.  MRI pending to evaluate for acute stroke. Admission d/w Dr. Hal Hope.  Final Clinical Impressions(s) / ED Diagnoses   Final diagnoses:  Aspiration pneumonia ([place])    New Prescriptions New Prescriptions   No medications on file  I personally performed the services described in this documentation, which was scribed in my presence. The recorded information has been reviewed and is accurate.    Ezequiel Essex, MD 07/13/16 [phone]

## 2016-07-13 NOTE — ED Notes (Signed)
Patient transported to MRI 

## 2016-07-13 NOTE — ED Notes (Signed)
Pt being taken upstairs by Estill Batten, RN

## 2016-07-13 NOTE — Evaluation (Signed)
Physical Therapy Evaluation Patient Details Name: Clinton [name] MRN: [id number] DOB: [birth year] Today's Date: 07/13/2016   History of Present Illness  Clinton [name] is a 83 y.o. 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 . Pt had a fall and MRI revealed acute infarcts to hippocampus and thalamus. Pt with dysphagia and dysphasia from previous stroke.  Clinical Impression  Pt presents with decreased strength, balance and difficulty with sequencing and processing secondary to above. PTA pt was mod-I with use of SPC for ambulation, however, now requires modA x 2 for all mobility. Pt would benefit from d/c to CIR to return to mod-I level for safety, when medically ready. PT will follow.     Follow Up Recommendations CIR;Supervision/Assistance - 24 hour    Equipment Recommendations  None recommended by PT    Recommendations for Other Services Rehab consult     Precautions / Restrictions Precautions Precautions: Fall;Other (comment) Precaution Comments: droplet Restrictions Weight Bearing Restrictions: No      Mobility  Bed Mobility Overal bed mobility: Needs Assistance Bed Mobility: Supine to Sit;Sidelying to Sit   Sidelying to sit: Min assist;Mod assist Supine to sit: Min assist;Mod assist;HOB elevated     General bed mobility comments: repeated verbal and tactile cues for pt to grasp bed rail and flex knee to roll to sidelying; verbal and visual cues for pt to bring LE off EOB; verbal cues to push up to sit; pt impulsively laid back down and required verbal and tactile cues to sit EOB again;   Transfers Overall transfer level: Needs assistance Equipment used: Rolling walker (2 wheeled) Transfers: Sit to/from [facility] Sit to Stand: Mod assist Stand pivot transfers: Mod assist       General transfer comment: repeated verbal cues to stand pivot to  chair; verbal cues to take steps toward chair (approximately 2) and to reach back for chair to sit; verbal cues to push up from chair to stand; verbal cues for transfer of hands from chair to RW; verbal and tactile cues at trunk for upright posture  Ambulation/Gait Ambulation/Gait assistance: Mod assist;+2 safety/equipment Ambulation Distance (Feet): 30 Feet Assistive device: Rolling walker (2 wheeled) Gait Pattern/deviations: Step-to pattern;Decreased step length - right;Decreased step length - left;Decreased stride length;Trunk flexed;Wide base of support Gait velocity: decreased Gait velocity interpretation: Below normal speed for age/gender General Gait Details: physical assist for RW control; verbal and tactile cues for safety with RW; tactile and verbal cues for upright posture; pt impulsively turns around to head back to room upon entering hallway   Stairs            Wheelchair Mobility    Modified Rankin (Stroke Patients Only)       Balance Overall balance assessment: Needs assistance Sitting-balance support: No upper extremity supported;Feet supported Sitting balance-Leahy Scale: Good Sitting balance - Comments: sat EOB x 4 min for MMT and coordination testing without LOB   Standing balance support: Single extremity supported Standing balance-Leahy Scale: Poor Standing balance comment: requires 1 HHA for performance of standing marching to prevent LOB                             Pertinent Vitals/Pain Pain Assessment: No/denies pain    Home Living Family/patient expects to be discharged to:: Private residence Living Arrangements: Spouse/significant other Available Help at Discharge: Family;Available 24 hours/day (unsure level  of assist wife can provide) Type of Home: House Home Access: Stairs to enter Entrance Stairs-Rails: Right Entrance Stairs-Number of Steps: 6 Home Layout: Two level Home Equipment: [place] - 2 wheels;Shower seat;Grab bars -  tub/shower;Grab bars - toilet;Cane - single point      Prior Function Level of Independence: Independent with assistive device(s)         Comments: used cane up until a week ago started using RW     Hand Dominance   Dominant Hand: Right    Extremity/Trunk Assessment   Upper Extremity Assessment Upper Extremity Assessment: Generalized weakness    Lower Extremity Assessment Lower Extremity Assessment: Generalized weakness    Cervical / Trunk Assessment Cervical / Trunk Assessment: Kyphotic  Communication   Communication: Expressive difficulties  Cognition Arousal/Alertness: Awake/alert Behavior During Therapy: WFL for tasks assessed/performed Overall Cognitive Status: Impaired/Different from baseline Area of Impairment: Attention;Memory;Following commands;Safety/judgement;Awareness;Problem solving   Current Attention Level: Sustained Memory: Decreased short-term memory Following Commands: Follows one step commands inconsistently;Follows one step commands with increased time Safety/Judgement: Decreased awareness of safety;Decreased awareness of deficits Awareness: Emergent Problem Solving: Decreased initiation;Difficulty sequencing;Requires verbal cues;Requires tactile cues;Slow processing General Comments: requires repeated verbal cues to complete task; requires frequent verbal and tactile cues for initiation of task     General Comments      Exercises     Assessment/Plan    PT Assessment Patient needs continued PT services  PT Problem List Decreased strength;Decreased activity tolerance;Decreased balance;Decreased mobility;Decreased knowledge of use of DME;Decreased safety awareness       PT Treatment Interventions DME instruction;Gait training;Therapeutic activities;Therapeutic exercise;Balance training;Patient/family education    PT Goals (Current goals can be found in the Care Plan section)  Acute Rehab PT Goals Patient Stated Goal: get better PT Goal  Formulation: With patient Time For Goal Achievement: 07/27/16 Potential to Achieve Goals: Good    Frequency Min 3X/week   Barriers to discharge Decreased caregiver support wife unable to provide physical assist needed    Co-evaluation               End of Session Equipment Utilized During Treatment: Gait belt Activity Tolerance: Patient tolerated treatment well Patient left: in chair;with call bell/phone within reach;with chair alarm set;with family/visitor present Nurse Communication: Mobility status PT Visit Diagnosis: Unsteadiness on feet (R26.81);Other abnormalities of gait and mobility (R26.89);Muscle weakness (generalized) (M62.81);History of falling (Z91.81);Difficulty in walking, not elsewhere classified (R26.2);Other symptoms and signs involving the nervous system (R29.898)    Functional Assessment Tool Used: Clinical judgement Functional Limitation: Mobility: Walking and moving around Mobility: Walking and Moving Around Current Status [id number]): At least 40 percent but less than 60 percent impaired, limited or restricted Mobility: Walking and Moving Around Goal Status [phone]): At least 1 percent but less than 20 percent impaired, limited or restricted    Time: 1524-1555 PT Time Calculation (min) (ACUTE ONLY): 31 min   Charges:   PT Evaluation $PT Eval Moderate Complexity: 1 Procedure PT Treatments $Gait Training: 8-22 mins   PT G Codes:   PT G-Codes **NOT FOR INPATIENT CLASS** Functional Assessment Tool Used: Clinical judgement Functional Limitation: Mobility: Walking and moving around Mobility: Walking and Moving Around Current Status [id number]): At least 40 percent but less than 60 percent impaired, limited or restricted Mobility: Walking and Moving Around Goal Status [phone]): At least 1 percent but less than 20 percent impaired, limited or restricted     [facility] 07/13/2016, 4:24 PM   Tracie Harrier, SPT Acute Rehab SPT [phone]

## 2016-07-13 NOTE — ED Triage Notes (Signed)
Pt from home with gems c/o fall around 130 this morning. Pt stumbled and fell in the bathroom and hit his head. Pt denies LOC or pain in his head neck or back. No redness or swelling noted to the head. Pt is taking blood thinners. Per pt wife, pt has had slurred speech for the past week that has progressed. VSS NAD. Pt alert and oriented x4.

## 2016-07-13 NOTE — ED Notes (Signed)
No urine noted at this time 

## 2016-07-13 NOTE — Progress Notes (Signed)
Received report at this time.  Will take over care of patient

## 2016-07-13 NOTE — Plan of Care (Signed)
Problem: SLP Dysphagia Goals Goal: Patient will utilize recommended strategies Patient will utilize recommended strategies during swallow to increase swallowing safety with Pt will utilize chin tuck with thin liquids w/ min assist.

## 2016-07-13 NOTE — ED Notes (Signed)
Pt placed on a urnal to attempt to collect urine

## 2016-07-14 ENCOUNTER — Encounter (HOSPITAL_COMMUNITY): Payer: Self-pay | Admitting: Radiology

## 2016-07-14 ENCOUNTER — Observation Stay (HOSPITAL_COMMUNITY): Payer: Medicare Other

## 2016-07-14 DIAGNOSIS — R471 Dysarthria and anarthria: Secondary | ICD-10-CM

## 2016-07-14 DIAGNOSIS — I10 Essential (primary) hypertension: Secondary | ICD-10-CM | POA: Diagnosis present

## 2016-07-14 DIAGNOSIS — I69322 Dysarthria following cerebral infarction: Secondary | ICD-10-CM | POA: Diagnosis not present

## 2016-07-14 DIAGNOSIS — I639 Cerebral infarction, unspecified: Principal | ICD-10-CM

## 2016-07-14 DIAGNOSIS — E785 Hyperlipidemia, unspecified: Secondary | ICD-10-CM | POA: Diagnosis present

## 2016-07-14 DIAGNOSIS — Z7982 Long term (current) use of aspirin: Secondary | ICD-10-CM | POA: Diagnosis not present

## 2016-07-14 DIAGNOSIS — E119 Type 2 diabetes mellitus without complications: Secondary | ICD-10-CM | POA: Diagnosis not present

## 2016-07-14 DIAGNOSIS — E1151 Type 2 diabetes mellitus with diabetic peripheral angiopathy without gangrene: Secondary | ICD-10-CM | POA: Diagnosis present

## 2016-07-14 DIAGNOSIS — Z66 Do not resuscitate: Secondary | ICD-10-CM | POA: Diagnosis present

## 2016-07-14 DIAGNOSIS — Z809 Family history of malignant neoplasm, unspecified: Secondary | ICD-10-CM | POA: Diagnosis not present

## 2016-07-14 DIAGNOSIS — Z7902 Long term (current) use of antithrombotics/antiplatelets: Secondary | ICD-10-CM | POA: Diagnosis not present

## 2016-07-14 DIAGNOSIS — I69321 Dysphasia following cerebral infarction: Secondary | ICD-10-CM | POA: Diagnosis not present

## 2016-07-14 DIAGNOSIS — J690 Pneumonitis due to inhalation of food and vomit: Secondary | ICD-10-CM | POA: Diagnosis present

## 2016-07-14 DIAGNOSIS — I248 Other forms of acute ischemic heart disease: Secondary | ICD-10-CM | POA: Diagnosis present

## 2016-07-14 DIAGNOSIS — R1319 Other dysphagia: Secondary | ICD-10-CM

## 2016-07-14 DIAGNOSIS — R262 Difficulty in walking, not elsewhere classified: Secondary | ICD-10-CM | POA: Diagnosis present

## 2016-07-14 DIAGNOSIS — I63312 Cerebral infarction due to thrombosis of left middle cerebral artery: Secondary | ICD-10-CM | POA: Diagnosis not present

## 2016-07-14 DIAGNOSIS — I69391 Dysphagia following cerebral infarction: Secondary | ICD-10-CM | POA: Diagnosis not present

## 2016-07-14 DIAGNOSIS — E860 Dehydration: Secondary | ICD-10-CM | POA: Diagnosis present

## 2016-07-14 DIAGNOSIS — E784 Other hyperlipidemia: Secondary | ICD-10-CM | POA: Diagnosis not present

## 2016-07-14 DIAGNOSIS — Z79899 Other long term (current) drug therapy: Secondary | ICD-10-CM | POA: Diagnosis not present

## 2016-07-14 DIAGNOSIS — Z7984 Long term (current) use of oral hypoglycemic drugs: Secondary | ICD-10-CM | POA: Diagnosis not present

## 2016-07-14 LAB — LIPID PANEL
CHOLESTEROL: 121 mg/dL (ref 0–200)
HDL: 51 mg/dL (ref >40)
LDL CALC: 58 mg/dL (ref 0–99)
TRIGLYCERIDES: 59 mg/dL (ref <150)
Total CHOL/HDL Ratio: 2.4 RATIO
VLDL: 12 mg/dL (ref 0–40)

## 2016-07-14 LAB — GLUCOSE, CAPILLARY
GLUCOSE-CAPILLARY: 108 mg/dL — AB (ref 65–99)
GLUCOSE-CAPILLARY: 114 mg/dL — AB (ref 65–99)
GLUCOSE-CAPILLARY: 125 mg/dL — AB (ref 65–99)
Glucose-Capillary: 105 mg/dL — ABNORMAL HIGH (ref 65–99)
Glucose-Capillary: 107 mg/dL — ABNORMAL HIGH (ref 65–99)
Glucose-Capillary: 135 mg/dL — ABNORMAL HIGH (ref 65–99)
Glucose-Capillary: 140 mg/dL — ABNORMAL HIGH (ref 65–99)
Glucose-Capillary: 66 mg/dL (ref 65–99)

## 2016-07-14 LAB — LEGIONELLA PNEUMOPHILA SEROGP 1 UR AG: L. pneumophila Serogp 1 Ur Ag: NEGATIVE

## 2016-07-14 MED ORDER — INSULIN ASPART 100 UNIT/ML ~~LOC~~ SOLN
0.0000 [IU] | Freq: Three times a day (TID) | SUBCUTANEOUS | Status: DC
  Administered 2016-07-15 (×2): 1 [IU] via SUBCUTANEOUS

## 2016-07-14 MED ORDER — IOPAMIDOL (ISOVUE-370) INJECTION 76%
INTRAVENOUS | Status: AC
  Administered 2016-07-14: 50 mL
  Filled 2016-07-14: qty 50

## 2016-07-14 MED ORDER — CLOPIDOGREL BISULFATE 75 MG PO TABS
75.0000 mg | ORAL_TABLET | Freq: Every day | ORAL | Status: DC
  Administered 2016-07-14 – 2016-07-16 (×3): 75 mg via ORAL
  Filled 2016-07-14 (×3): qty 1

## 2016-07-14 MED ORDER — ATORVASTATIN CALCIUM 10 MG PO TABS
10.0000 mg | ORAL_TABLET | Freq: Every day | ORAL | Status: DC
  Administered 2016-07-14 – 2016-07-16 (×3): 10 mg via ORAL
  Filled 2016-07-14 (×3): qty 1

## 2016-07-14 NOTE — Evaluation (Signed)
Occupational Therapy Evaluation Patient Details Name: Clinton [name] MRN: [id number] DOB: 10/15/1923 Today's Date: 07/14/2016    History of Present Illness Clinton [name] is a 83 y.o. 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 . Pt had a fall and MRI revealed acute infarcts to hippocampus and thalamus. Pt with dysphagia and dysphasia from previous stroke.   Clinical Impression   Per pts wife; pt was independent with ADL PTA. Currently pt requires min assist +2 for functional mobility and min-mod assist for ADL. Pt presenting with speech deficits, impulsivity, impaired balance, and cognitive deficits impacting his independence and safety with ADL and functional mobility. Recommending CIR level therapies to maximize independence and safety with ADL and functional mobility prior to return home. Pt would benefit from continued skilled OT to address established goals.    Follow Up Recommendations  CIR;Supervision/Assistance - 24 hour    Equipment Recommendations  Other (comment) (TBD)    Recommendations for Other Services       Precautions / Restrictions Precautions Precautions: Fall;Other (comment) Precaution Comments: droplet Restrictions Weight Bearing Restrictions: No      Mobility Bed Mobility Overal bed mobility: Needs Assistance Bed Mobility: Supine to Sit           General bed mobility comments: Pt performed bed mobility with PT present in room.  Transfers Overall transfer level: Needs assistance Equipment used: Rolling walker (2 wheeled) Transfers: Sit to/from Stand Sit to Stand: Min assist;+2 safety/equipment         General transfer comment: Pt impulsive with transfers with 2 hands on RW to boost up.    Balance Overall balance assessment: Needs assistance Sitting-balance support: Feet supported Sitting balance-Leahy Scale: Good     Standing  balance support: Bilateral upper extremity supported Standing balance-Leahy Scale: Poor Standing balance comment: RW for support                            ADL Overall ADL's : Needs assistance/impaired Eating/Feeding: NPO   Grooming: Set up;Supervision/safety;Sitting;Wash/dry face;Oral care   Upper Body Bathing: Minimal assistance;Sitting   Lower Body Bathing: Moderate assistance;Sit to/from stand   Upper Body Dressing : Minimal assistance;Sitting Upper Body Dressing Details (indicate cue type and reason): to don gown Lower Body Dressing: Moderate assistance;Sit to/from stand Lower Body Dressing Details (indicate cue type and reason): Pt able to adjust socks sitting EOB with min guard assist. Assist provided for sit to stand and standing balance, anticipate would require assist to pull pants up in standing Toilet Transfer: Minimal assistance;+2 for safety/equipment;Ambulation;RW Toilet Transfer Details (indicate cue type and reason): Simulated by sit to stand from EOB with functional mobility in room         Functional mobility during ADLs: Minimal assistance;+2 for safety/equipment;Rolling walker;Cueing for sequencing;Cueing for safety General ADL Comments: Pt veering to R side throughout mobility. Pt imulsive and needs physical assist to direct RW during mobility.     Vision Baseline Vision/History: Wears glasses Wears Glasses: Reading only Additional Comments: Difficult to assess due to impaired cognition/communication     Perception     Praxis      Pertinent Vitals/Pain Pain Assessment: Faces Faces Pain Scale: No hurt     Hand Dominance Right   Extremity/Trunk Assessment Upper Extremity Assessment Upper Extremity Assessment: Generalized weakness   Lower Extremity Assessment Lower Extremity Assessment: Defer to PT evaluation   Cervical /  Trunk Assessment Cervical / Trunk Assessment: Kyphotic   Communication Communication Communication: Expressive  difficulties   Cognition Arousal/Alertness: Awake/alert Behavior During Therapy: Impulsive Overall Cognitive Status: Impaired/Different from baseline Area of Impairment: Attention;Memory;Following commands;Safety/judgement;Awareness;Problem solving   Current Attention Level: Sustained Memory: Decreased short-term memory Following Commands: Follows one step commands inconsistently Safety/Judgement: Decreased awareness of safety;Decreased awareness of deficits Awareness: Emergent Problem Solving: Decreased initiation;Requires verbal cues;Requires tactile cues;Difficulty sequencing General Comments: Pt impulsive with transfers despite education.   General Comments       Exercises       Shoulder Instructions      Home Living Family/patient expects to be discharged to:: Private residence Living Arrangements: Spouse/significant other Available Help at Discharge: Family;Available 24 hours/day Type of Home: House Home Access: Stairs to enter [employer] of Steps: 6 Entrance Stairs-Rails: Right Home Layout: Two level Alternate Level Stairs-Number of Steps:  (has stair lift)   Bathroom Shower/Tub: Teacher, early years/pre: Standard Bathroom Accessibility: Yes   Home Equipment: Environmental consultant - 2 wheels;Shower seat;Grab bars - tub/shower;Grab bars - toilet;Cane - single point          Prior Functioning/Environment Level of Independence: Independent with assistive device(s)        Comments: used cane up until a week ago started using RW. Wife reports pt independent with ADL.        OT Problem List: Decreased strength;Decreased activity tolerance;Impaired balance (sitting and/or standing);Decreased coordination;Decreased cognition;Decreased safety awareness;Decreased knowledge of use of DME or AE;Decreased knowledge of precautions;Impaired UE functional use      OT Treatment/Interventions: Self-care/ADL training;Therapeutic exercise;Neuromuscular  education;Energy conservation;DME and/or AE instruction;Therapeutic activities;Patient/family education;Balance training;Cognitive remediation/compensation    OT Goals(Current goals can be found in the care plan section) Acute Rehab OT Goals Patient Stated Goal: get up and walk OT Goal Formulation: With patient Time For Goal Achievement: 07/28/16 Potential to Achieve Goals: Good ADL Goals Pt Will Perform Grooming: with supervision;standing Pt Will Perform Upper Body Bathing: with supervision;sitting Pt Will Perform Lower Body Bathing: with supervision;sit to/from stand Pt Will Transfer to Toilet: with supervision;ambulating;bedside commode (over toilet) Pt Will Perform Toileting - Clothing Manipulation and hygiene: with supervision;sit to/from stand  OT Frequency: Min 2X/week   Barriers to D/C:            Co-evaluation PT/OT/SLP Co-Evaluation/Treatment: Yes Reason for Co-Treatment: Complexity of the patient's impairments (multi-system involvement);For patient/therapist safety   OT goals addressed during session: ADL's and self-care      End of Session Equipment Utilized During Treatment: Gait belt;Rolling walker  Activity Tolerance: Patient tolerated treatment well Patient left: in chair;with call bell/phone within reach;with chair alarm set;with family/visitor present  OT Visit Diagnosis: Unsteadiness on feet (R26.81);Muscle weakness (generalized) (M62.81)                ADL either performed or assessed with clinical judgement  Time: 1046-1105 OT Time Calculation (min): 19 min Charges:  OT General Charges $OT Visit: 1 Procedure OT Evaluation $OT Eval Moderate Complexity: 1 Procedure G-Codes: OT G-codes **NOT FOR INPATIENT CLASS** Functional Assessment Tool Used: Clinical judgement Functional Limitation: Self care Self Care Current Status [id number]): At least 20 percent but less than 40 percent impaired, limited or restricted Self Care Goal Status [id number]): At least 1  percent but less than 20 percent impaired, limited or restricted   Mel Almond A. Ulice Brilliant, M.S., OTR/L Pager: [place] 07/14/2016, 11:22 AM

## 2016-07-14 NOTE — Progress Notes (Signed)
PROGRESS NOTE    Clinton [name]  [id number] DOB: [birth year] DOA: 07/13/2016 PCP: Carlena Sax, MD   Outpatient Specialists:    Brief Narrative:   Clinton [name] is a 83 y.o. male who presents with recurrent stroke and concern for progressive dysphagia and aspiration pneumonia. Will change ABX from CTX/Azithro to Unasyn. Additionally SSI for glucose control though pt only on metformin and last A1c in 2015 in pre-DM range (6.0).    Assessment & Plan:   Principal Problem:   CVA (cerebral vascular accident) ([place]) Active Problems:   Diabetes mellitus type 2 in nonobese ([place])   Dysphagia   Aspiration pneumonia ([facility])   Leukocytosis   HLD (hyperlipidemia)   Ischemic stroke ([place])   Recurrent aspiration bronchitis/pneumonia (HCC)   CVA (cerebral vascular accident)  -Patient presents after fall with progressive issues with dysarthria and dysphagia with MRI revealing positive subcentimeter acute infarct -History of prior CVA 2 years ago with similar symptoms and presentation -Ischemic stroke order set initiated -Echocardiogram -Carotid duplex -SLP/PT/OT- CIR -Lipid panel: LD 58 -ASA/plavix    Aspiration pneumonia /history of Dysphagia -Patient presents with history of progressive worsening of dysphagia symptoms with more acute worsening of symptoms in the past 1 week with evidence of stroke -seen by SLP: regular diet   Mild elevation in troponin -No reports of chest pain -Likely demand ischemia in setting of acute CVA as well as aspiration pneumonitis -For completeness of exam cycle troponin    Leukocytosis -Likely secondary to dehydration and acute aspiration pneumonitis -acquired pneumonia (see above) -Blood cultures -Influenza PCR -unasyn    Diabetes mellitus type 2 in nonobese  -SSI    HLD (hyperlipidemia) -resume home med   DVT prophylaxis:  Lovenox   Code Status: DNR   Family Communication: Wife   Disposition Plan:     Consultants:    Neuro  CIR      Subjective: Speech difficult to understand  Objective: Vitals:   07/13/16 1214 07/13/16 1500 07/13/16 2230 07/14/16 0532  BP: (!) 142/61 (!) 153/63 137/68 106/69  Pulse: 62 (!) 57 60   Resp: 17  17 18   Temp: 97.8 F (36.6 C) 97.4 F (36.3 C) 98.5 F (36.9 C) 98.8 F (37.1 C)  TempSrc: Oral Oral Oral Oral  SpO2: 100% 100% 100% 98%  Weight:    50.8 kg (112 lb)    Intake/Output Summary (Last 24 hours) at 07/14/16 1231 Last data filed at 07/14/16 0500  Gross per 24 hour  Intake             1475 ml  Output              450 ml  Net             1025 ml   Filed Weights   07/14/16 0532  Weight: 50.8 kg (112 lb)    Examination:  General exam: Appears calm and comfortable  Respiratory system: Clear to auscultation. Respiratory effort normal. Cardiovascular system: S1 & S2 heard, RRR. No JVD, murmurs, rubs, gallops or clicks. No pedal edema. Gastrointestinal system: Abdomen is nondistended, soft and nontender. No organomegaly or masses felt. Normal bowel sounds heard. Central nervous system: Alert and oriented. No focal neurological deficits.     Data Reviewed: I have personally reviewed following labs and imaging studies  CBC:  Recent Labs Lab 07/13/16 0353  WBC 15.2*  NEUTROABS 13.7*  HGB 12.3*  HCT 37.5*  MCV 91.9  PLT [id number]   Basic Metabolic Panel:  Recent Labs Lab 07/13/16 0353  NA 141  K 4.2  CL 105  CO2 28  GLUCOSE 167*  BUN 22*  CREATININE 0.88  CALCIUM 9.4   GFR: CrCl cannot be calculated (Unknown ideal weight.). Liver Function Tests:  Recent Labs Lab 07/13/16 0353  AST 21  ALT 12*  ALKPHOS 62  BILITOT 0.5  PROT 6.4*  ALBUMIN 3.3*   No results for input(s): LIPASE, AMYLASE in the last 168 hours. No results for input(s): AMMONIA in the last 168 hours. Coagulation Profile:  Recent Labs Lab 07/13/16 0353  INR 1.04   Cardiac Enzymes:  Recent Labs Lab 07/13/16 0353 07/13/16 0833  TROPONINI 0.04* 0.04*    BNP (last 3 results) No results for input(s): PROBNP in the last 8760 hours. HbA1C: No results for input(s): HGBA1C in the last 72 hours. CBG:  Recent Labs Lab 07/14/16 0132 07/14/16 0218 07/14/16 0359 07/14/16 0802 07/14/16 1223  GLUCAP 107* 140* 108* 135* 114*   Lipid Profile:  Recent Labs  07/14/16 0507  CHOL 121  HDL 51  LDLCALC 58  TRIG 59  CHOLHDL 2.4   Thyroid Function Tests: No results for input(s): TSH, T4TOTAL, FREET4, T3FREE, THYROIDAB in the last 72 hours. Anemia Panel: No results for input(s): VITAMINB12, FOLATE, FERRITIN, TIBC, IRON, RETICCTPCT in the last 72 hours. Urine analysis:    Component Value Date/Time   COLORURINE YELLOW 07/13/2016 0521   APPEARANCEUR CLEAR 07/13/2016 0521   LABSPEC 1.021 07/13/2016 0521   PHURINE 8.0 07/13/2016 0521   GLUCOSEU 50 (A) 07/13/2016 0521   HGBUR NEGATIVE 07/13/2016 0521   BILIRUBINUR NEGATIVE 07/13/2016 0521   KETONESUR NEGATIVE 07/13/2016 0521   PROTEINUR 30 (A) 07/13/2016 0521   UROBILINOGEN 0.2 10/15/2013 1913   NITRITE NEGATIVE 07/13/2016 0521   LEUKOCYTESUR NEGATIVE 07/13/2016 0521     )No results found for this or any previous visit (from the past 240 hour(s)).    Anti-infectives    Start     Dose/Rate Route Frequency Ordered Stop   07/14/16 0600  cefTRIAXone (ROCEPHIN) 1 g in dextrose 5 % 50 mL IVPB  Status:  Discontinued     1 g 100 mL/hr over 30 Minutes Intravenous Every 24 hours 07/13/16 0716 07/13/16 0833   07/13/16 0900  Ampicillin-Sulbactam (UNASYN) 3 g in sodium chloride 0.9 % 100 mL IVPB     3 g 200 mL/hr over 30 Minutes Intravenous Every 8 hours 07/13/16 0844     07/13/16 0800  azithromycin (ZITHROMAX) 500 mg in dextrose 5 % 250 mL IVPB  Status:  Discontinued     500 mg 250 mL/hr over 60 Minutes Intravenous Every 24 hours 07/13/16 0716 07/13/16 0833   07/13/16 0730  cefTRIAXone (ROCEPHIN) 1 g in dextrose 5 % 50 mL IVPB  Status:  Discontinued     1 g 100 mL/hr over 30 Minutes  Intravenous  Once 07/13/16 [id number] 07/13/16 [id number]       Radiology Studies: Ct Angio Head W Or Wo Contrast  Result Date: 07/14/2016 CLINICAL DATA:  Acute left basal ganglia/ deep white matter infarct on MRI. EXAM: CT ANGIOGRAPHY HEAD AND NECK TECHNIQUE: Multidetector CT imaging of the head and neck was performed using the standard protocol during bolus administration of intravenous contrast. Multiplanar CT image reconstructions and MIPs were obtained to evaluate the vascular anatomy. Carotid stenosis measurements (when applicable) are obtained utilizing NASCET criteria, using the distal internal carotid diameter as the denominator. CONTRAST:  50 mL Isovue 370 COMPARISON:  Head MRI and CT  07/13/2016.  Head MRA 10/16/2013. FINDINGS: CT HEAD FINDINGS Brain: Subcentimeter hypodensity in the left corona radiata corresponds to the acute lacunar infarct on MRI. The multiple chronic lacunar infarcts are again seen involving the basal ganglia and thalami bilaterally. There is moderate cerebral atrophy. Cerebral white matter hypodensities are compatible with moderate chronic small vessel ischemic disease as seen on MRI. There is no evidence of acute intracranial hemorrhage, mass, midline shift, or extra-axial fluid collection. Vascular: No hyperdense vessel.  Calcified atherosclerosis. Skull: No fracture focal osseous lesion. Sinuses: Bilateral maxillary sinus mucous retention cysts, right larger than left. Clear mastoid air cells. Cerumen in the external auditory canals. Orbits: Bilateral cataract extraction. Review of the MIP images confirms the above findings CTA NECK FINDINGS Aortic arch: Standard 3 vessel aortic arch. Mild calcified and soft plaque in the aortic arch. Mild calcified plaque in the subclavian arteries without stenosis. Right carotid system: Patent without evidence of dissection or significant stenosis. Mild calcified plaque in the distal common carotid artery and about the carotid bifurcation. Left  carotid system: Patent without evidence of dissection or significant stenosis. Mild calcified plaque about the carotid bifurcation. Vertebral arteries: Patent and codominant. Calcified plaque at the right vertebral artery origin results in severe stenosis. Degenerative spurring in the cervical spine and results in mild right greater than left vertebral artery stenosis at C6-7 and severe left vertebral artery stenosis at C3-4. Skeleton: Advanced multilevel disc and facet degeneration in the cervical spine. Severe bilateral neural foraminal stenosis at C3-4. Other neck: Scattered venous gas, likely iatrogenic. Upper chest: Mild biapical scarring. Partially visualized consolidation in the posterior left upper lobe and superior segment of the left lower lobe. Partially visualized small left pleural effusion. Review of the MIP images confirms the above findings CTA HEAD FINDINGS Anterior circulation: Internal carotid arteries are patent from skullbase to carotid termini. There is relatively diffuse carotid siphon calcification bilaterally but no significant stenosis. ACAs and MCAs are patent with mild branch vessel irregularity but no evidence of major branch occlusion or significant proximal stenosis. There is a small anterior communicating artery. No aneurysm. Posterior circulation: Intracranial vertebral arteries are patent and codominant with mild atherosclerosis but no significant stenosis. Left PICA, bilateral AICAs, and bilateral SCAs are visualized. Basilar artery is widely patent. Posterior communicating arteries are not identified. PCAs are patent without evidence of significant proximal stenosis, however there are distal P2 stenoses which are moderate on the right and mild on the left, stable to mildly increased. No aneurysm. Venous sinuses: Patent. Anatomic variants: None. Delayed phase: No abnormal enhancement. Review of the MIP images confirms the above findings IMPRESSION: 1. No large vessel occlusion. 2.  Intracranial atherosclerosis with a moderate distal right P2 stenosis. 3. No significant proximal anterior circulation stenosis. 4. Mild bilateral cervical carotid artery atherosclerosis without stenosis. 5. Severe right vertebral artery origin stenosis. Bilateral V2 segment stenoses (severe on the left) due to degenerative spurring in the cervical spine. 6. Partially visualized left upper and lower lobe pulmonary consolidation concerning for pneumonia. Small pleural effusion. Electronically Signed   By: Logan Bores M.D.   On: 07/14/2016 09:35   Dg Chest 2 View  Result Date: 07/14/2016 CLINICAL DATA:  Cough. EXAM: CHEST  2 VIEW COMPARISON:  Radiographs of July 13, 2016. FINDINGS: Stable cardiomediastinal silhouette. No pneumothorax is noted. No pleural effusion is noted. Multilevel degenerative disc disease is noted in lower thoracic spine. Minimal right basilar scarring or subsegmental atelectasis is noted. Increased left basilar opacity is noted concerning for worsening  infiltrate or pneumonia. IMPRESSION: Increase left basilar opacity is noted concerning for worsening infiltrate or pneumonia. Followup PA and lateral chest X-ray is recommended in 3-4 weeks following trial of antibiotic therapy to ensure resolution and exclude underlying malignancy. Electronically Signed   By: Marijo Conception, M.D.   On: 07/14/2016 08:33   Dg Chest 2 View  Result Date: 07/13/2016 CLINICAL DATA:  Status post fall. Altered mental status and slurred speech. Initial encounter. EXAM: CHEST  2 VIEW COMPARISON:  Chest radiograph performed 10/15/2013 FINDINGS: The lungs are well-aerated. Mild bibasilar opacities may reflect atelectasis or possibly mild pneumonia. There is no evidence of pleural effusion or pneumothorax. The heart is borderline normal in size. No acute osseous abnormalities are seen. IMPRESSION: Mild bibasilar airspace opacities may reflect atelectasis or possibly mild pneumonia. No displaced rib fracture seen.  Electronically Signed   By: Garald Balding M.D.   On: 07/13/2016 03:17   Ct Head Wo Contrast  Result Date: 07/13/2016 CLINICAL DATA:  Status post fall while going to bathroom. Hit head. Slurred speech over the past few weeks. Concern for cervical spine injury. Initial encounter. EXAM: CT HEAD WITHOUT CONTRAST CT CERVICAL SPINE WITHOUT CONTRAST TECHNIQUE: Multidetector CT imaging of the head and cervical spine was performed following the standard protocol without intravenous contrast. Multiplanar CT image reconstructions of the cervical spine were also generated. COMPARISON:  MRI/MRA of the brain performed 10/16/2013, and CT of the head performed 10/15/2013 FINDINGS: CT HEAD FINDINGS Brain: No evidence of acute infarction, hemorrhage, hydrocephalus, extra-axial collection or mass lesion/mass effect. Prominence of the ventricles and sulci reflects moderate cortical volume loss. Mild cerebellar atrophy is noted. Scattered periventricular and subcortical white matter change likely reflects small vessel ischemic microangiopathy. Chronic lacunar infarcts are seen at the basal ganglia bilaterally, and at the left thalamus. A chronic lacunar infarct is also noted at the right corona radiata. The brainstem and fourth ventricle are within normal limits. The cerebral hemispheres demonstrate grossly normal gray-white differentiation. No mass effect or midline shift is seen. Vascular: No hyperdense vessel or unexpected calcification. Skull: There is no evidence of fracture; visualized osseous structures are unremarkable in appearance. Sinuses/Orbits: The orbits are within normal limits. A mucus retention cyst or polyp is noted at the right maxillary sinus. The remaining paranasal sinuses and mastoid air cells are well-aerated. Other: No significant soft tissue abnormalities are seen. CT CERVICAL SPINE FINDINGS Alignment: Normal. Skull base and vertebrae: No acute fracture. No primary bone lesion or focal pathologic process.  Degenerative change is noted about the dens. Soft tissues and spinal canal: No prevertebral fluid or swelling. No visible canal hematoma. Disc levels: Minimal disc space narrowing is noted at multiple levels along the cervical spine, with scattered anterior and posterior disc osteophyte complexes, and mild underlying facet disease. Upper chest: Mild scarring is noted at the lung apices. The thyroid gland is diminutive and grossly unremarkable in appearance. Scattered calcification is seen at the carotid bifurcations bilaterally. Other: No additional soft tissue abnormalities are seen. IMPRESSION: 1. No evidence of traumatic intracranial injury or fracture. 2. No evidence of fracture or subluxation along the cervical spine. 3. Moderate cortical volume loss and scattered small vessel ischemic microangiopathy. 4. Chronic lacunar infarcts at the basal ganglia bilaterally, at the left thalamus, and at the right corona radiata. 5. Mild degenerative change along the cervical spine. 6. Mucus retention cyst or polyp at the right maxillary sinus. 7. Mild scarring at the lung apices. 8. Scattered calcification at the carotid bifurcations bilaterally.  Carotid ultrasound would be helpful for further evaluation, when and as deemed clinically appropriate. Electronically Signed   By: Garald Balding M.D.   On: 07/13/2016 04:09   Ct Angio Neck W Or Wo Contrast  Result Date: 07/14/2016 CLINICAL DATA:  Acute left basal ganglia/ deep white matter infarct on MRI. EXAM: CT ANGIOGRAPHY HEAD AND NECK TECHNIQUE: Multidetector CT imaging of the head and neck was performed using the standard protocol during bolus administration of intravenous contrast. Multiplanar CT image reconstructions and MIPs were obtained to evaluate the vascular anatomy. Carotid stenosis measurements (when applicable) are obtained utilizing NASCET criteria, using the distal internal carotid diameter as the denominator. CONTRAST:  50 mL Isovue 370 COMPARISON:  Head  MRI and CT 07/13/2016.  Head MRA 10/16/2013. FINDINGS: CT HEAD FINDINGS Brain: Subcentimeter hypodensity in the left corona radiata corresponds to the acute lacunar infarct on MRI. The multiple chronic lacunar infarcts are again seen involving the basal ganglia and thalami bilaterally. There is moderate cerebral atrophy. Cerebral white matter hypodensities are compatible with moderate chronic small vessel ischemic disease as seen on MRI. There is no evidence of acute intracranial hemorrhage, mass, midline shift, or extra-axial fluid collection. Vascular: No hyperdense vessel.  Calcified atherosclerosis. Skull: No fracture focal osseous lesion. Sinuses: Bilateral maxillary sinus mucous retention cysts, right larger than left. Clear mastoid air cells. Cerumen in the external auditory canals. Orbits: Bilateral cataract extraction. Review of the MIP images confirms the above findings CTA NECK FINDINGS Aortic arch: Standard 3 vessel aortic arch. Mild calcified and soft plaque in the aortic arch. Mild calcified plaque in the subclavian arteries without stenosis. Right carotid system: Patent without evidence of dissection or significant stenosis. Mild calcified plaque in the distal common carotid artery and about the carotid bifurcation. Left carotid system: Patent without evidence of dissection or significant stenosis. Mild calcified plaque about the carotid bifurcation. Vertebral arteries: Patent and codominant. Calcified plaque at the right vertebral artery origin results in severe stenosis. Degenerative spurring in the cervical spine and results in mild right greater than left vertebral artery stenosis at C6-7 and severe left vertebral artery stenosis at C3-4. Skeleton: Advanced multilevel disc and facet degeneration in the cervical spine. Severe bilateral neural foraminal stenosis at C3-4. Other neck: Scattered venous gas, likely iatrogenic. Upper chest: Mild biapical scarring. Partially visualized consolidation in  the posterior left upper lobe and superior segment of the left lower lobe. Partially visualized small left pleural effusion. Review of the MIP images confirms the above findings CTA HEAD FINDINGS Anterior circulation: Internal carotid arteries are patent from skullbase to carotid termini. There is relatively diffuse carotid siphon calcification bilaterally but no significant stenosis. ACAs and MCAs are patent with mild branch vessel irregularity but no evidence of major branch occlusion or significant proximal stenosis. There is a small anterior communicating artery. No aneurysm. Posterior circulation: Intracranial vertebral arteries are patent and codominant with mild atherosclerosis but no significant stenosis. Left PICA, bilateral AICAs, and bilateral SCAs are visualized. Basilar artery is widely patent. Posterior communicating arteries are not identified. PCAs are patent without evidence of significant proximal stenosis, however there are distal P2 stenoses which are moderate on the right and mild on the left, stable to mildly increased. No aneurysm. Venous sinuses: Patent. Anatomic variants: None. Delayed phase: No abnormal enhancement. Review of the MIP images confirms the above findings IMPRESSION: 1. No large vessel occlusion. 2. Intracranial atherosclerosis with a moderate distal right P2 stenosis. 3. No significant proximal anterior circulation stenosis. 4. Mild bilateral cervical  carotid artery atherosclerosis without stenosis. 5. Severe right vertebral artery origin stenosis. Bilateral V2 segment stenoses (severe on the left) due to degenerative spurring in the cervical spine. 6. Partially visualized left upper and lower lobe pulmonary consolidation concerning for pneumonia. Small pleural effusion. Electronically Signed   By: Logan Bores M.D.   On: 07/14/2016 09:35   Ct Cervical Spine Wo Contrast  Result Date: 07/13/2016 CLINICAL DATA:  Status post fall while going to bathroom. Hit head. Slurred  speech over the past few weeks. Concern for cervical spine injury. Initial encounter. EXAM: CT HEAD WITHOUT CONTRAST CT CERVICAL SPINE WITHOUT CONTRAST TECHNIQUE: Multidetector CT imaging of the head and cervical spine was performed following the standard protocol without intravenous contrast. Multiplanar CT image reconstructions of the cervical spine were also generated. COMPARISON:  MRI/MRA of the brain performed 10/16/2013, and CT of the head performed 10/15/2013 FINDINGS: CT HEAD FINDINGS Brain: No evidence of acute infarction, hemorrhage, hydrocephalus, extra-axial collection or mass lesion/mass effect. Prominence of the ventricles and sulci reflects moderate cortical volume loss. Mild cerebellar atrophy is noted. Scattered periventricular and subcortical white matter change likely reflects small vessel ischemic microangiopathy. Chronic lacunar infarcts are seen at the basal ganglia bilaterally, and at the left thalamus. A chronic lacunar infarct is also noted at the right corona radiata. The brainstem and fourth ventricle are within normal limits. The cerebral hemispheres demonstrate grossly normal gray-white differentiation. No mass effect or midline shift is seen. Vascular: No hyperdense vessel or unexpected calcification. Skull: There is no evidence of fracture; visualized osseous structures are unremarkable in appearance. Sinuses/Orbits: The orbits are within normal limits. A mucus retention cyst or polyp is noted at the right maxillary sinus. The remaining paranasal sinuses and mastoid air cells are well-aerated. Other: No significant soft tissue abnormalities are seen. CT CERVICAL SPINE FINDINGS Alignment: Normal. Skull base and vertebrae: No acute fracture. No primary bone lesion or focal pathologic process. Degenerative change is noted about the dens. Soft tissues and spinal canal: No prevertebral fluid or swelling. No visible canal hematoma. Disc levels: Minimal disc space narrowing is noted at  multiple levels along the cervical spine, with scattered anterior and posterior disc osteophyte complexes, and mild underlying facet disease. Upper chest: Mild scarring is noted at the lung apices. The thyroid gland is diminutive and grossly unremarkable in appearance. Scattered calcification is seen at the carotid bifurcations bilaterally. Other: No additional soft tissue abnormalities are seen. IMPRESSION: 1. No evidence of traumatic intracranial injury or fracture. 2. No evidence of fracture or subluxation along the cervical spine. 3. Moderate cortical volume loss and scattered small vessel ischemic microangiopathy. 4. Chronic lacunar infarcts at the basal ganglia bilaterally, at the left thalamus, and at the right corona radiata. 5. Mild degenerative change along the cervical spine. 6. Mucus retention cyst or polyp at the right maxillary sinus. 7. Mild scarring at the lung apices. 8. Scattered calcification at the carotid bifurcations bilaterally. Carotid ultrasound would be helpful for further evaluation, when and as deemed clinically appropriate. Electronically Signed   By: Garald Balding M.D.   On: 07/13/2016 04:09   Mr Brain Wo Contrast  Result Date: 07/13/2016 CLINICAL DATA:  Status post fall prior to admission. Dizziness. Slurred speech for weeks. Stroke risk factors include diabetes, hypertension, and previous stroke. EXAM: MRI HEAD WITHOUT CONTRAST TECHNIQUE: Multiplanar, multiecho pulse sequences of the brain and surrounding structures were obtained without intravenous contrast. COMPARISON:  CT head 07/13/2016.  MR head 10/16/2013. FINDINGS: Brain: Subcentimeter area of acute infarction  involves the LEFT lentiform nucleus superiorly, extending to the periventricular white matter. No acute hemorrhage, mass lesion, or extra-axial fluid. Advanced atrophy. Hydrocephalus ex vacuo. Extensive chronic microvascular ischemic change. Prominent perivascular spaces reflective of hypertension. Widespread areas  of chronic lacunar infarction involving the brainstem, deep nuclei, and periventricular white matter, similar to priors. 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 They appear more numerous/larger on today's exam compared with priors due to today's study performed on 3T scanner. Vascular: Normal flow voids. Skull and upper cervical spine: Normal marrow signal. Extensive pannus projects dorsally from the C1-C2 articulation, narrowing the cervicomedullary junction. Upper cervical cord compression is not established. Sinuses/Orbits: No acute findings. BILATERAL cataract extraction. RIGHT maxillary sinus retention cyst. Other: None. Compared with recent CT, the acute infarct is difficult to visualize. Chronic ischemic changes are similar to 2015. IMPRESSION: Subcentimeter area of acute infarction involving the superior lentiform nucleus and periventricular white matter, nonhemorrhagic. Atrophy and small vessel disease, multiple chronic lacunar infarcts, widespread microbleeds, similar to 2015. Electronically Signed   By: Staci Righter M.D.   On: 07/13/2016 07:01        Scheduled Meds: . ampicillin-sulbactam (UNASYN) IV  3 g Intravenous Q8H  . aspirin  300 mg Rectal Daily   Or  . aspirin  325 mg Oral Daily  . atorvastatin  10 mg Oral Daily  . clopidogrel  75 mg Oral Daily  . enoxaparin (LOVENOX) injection  30 mg Subcutaneous Q24H  . insulin aspart  0-9 Units Subcutaneous Q4H   Continuous Infusions: . sodium chloride 75 mL/hr at 07/13/16 2327     LOS: 0 days    Time spent: 25 min    [place], DO [facility] Pager [phone]  If 7PM-7AM, please contact night-coverage [email or web address] Password [facility] 07/14/2016, 12:31 PM

## 2016-07-14 NOTE — Progress Notes (Signed)
OT Cancellation Note  Patient Details Name: Clinton [name] MRN: [id number] DOB: 07-Dec-1923   Cancelled Treatment:    Reason Eval/Treat Not Completed: Patient at procedure or test/ unavailable (per RN; pt getting ready to go off floor for xray/CT). Will follow up for OT eval as time allows.  Binnie Kand M.S., OTR/L Pager: [phone]  07/14/2016, 8:02 AM

## 2016-07-14 NOTE — Progress Notes (Signed)
Physical Therapy Treatment Patient Details Name: Clinton [name] MRN: [id number] DOB: [birth year] Today's Date: 07/14/2016    History of Present Illness Clinton [name] is a 83 y.o. 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 . Pt had a fall and MRI revealed acute infarcts to hippocampus and thalamus. Pt with dysphagia and dysphasia from previous stroke.    PT Comments    Patient progressing with mobility, but still with significant impulsivity and limited safety and deficits awareness and high fall risk.  Continue to feel he will need CIR level rehab at d/c.    Follow Up Recommendations  CIR;Supervision/Assistance - 24 hour     Equipment Recommendations  None recommended by PT    Recommendations for Other Services       Precautions / Restrictions Precautions Precautions: Fall Precaution Comments: droplet Restrictions Weight Bearing Restrictions: No    Mobility  Bed Mobility Overal bed mobility: Needs Assistance Bed Mobility: Supine to Sit     Supine to sit: Supervision;HOB elevated     General bed mobility comments: impulsively getting up while therapist trying to make sure all lines safe for pt to come upright   Transfers Overall transfer level: Needs assistance Equipment used: Rolling walker (2 wheeled) Transfers: Sit to/from Stand Sit to Stand: Min assist;+2 safety/equipment         General transfer comment: Pt impulsive with transfers with 2 hands on RW to boost up.  Ambulation/Gait Ambulation/Gait assistance: Min assist;+2 safety/equipment;Mod assist Ambulation Distance (Feet): 65 Feet Assistive device: Rolling walker (2 wheeled) Gait Pattern/deviations: Step-through pattern;Decreased stride length;Trunk flexed;Shuffle;Drifts right/left     General Gait Details: skimming L foot on floor throughout and assist for walker due to veering  R   Stairs            Wheelchair Mobility    Modified Rankin (Stroke Patients Only) Modified Rankin (Stroke Patients Only) Pre-Morbid Rankin Score: Slight disability Modified Rankin: Moderately severe disability     Balance Overall balance assessment: Needs assistance Sitting-balance support: Feet supported Sitting balance-Leahy Scale: Good Sitting balance - Comments: able to adjust socks in sitting EOB   Standing balance support: Bilateral upper extremity supported Standing balance-Leahy Scale: Poor Standing balance comment: RW for support and minguard for safety                    Cognition Arousal/Alertness: Awake/alert Behavior During Therapy: Impulsive Overall Cognitive Status: Impaired/Different from baseline Area of Impairment: Attention;Memory;Following commands;Safety/judgement;Awareness;Problem solving   Current Attention Level: Sustained Memory: Decreased short-term memory Following Commands: Follows one step commands inconsistently Safety/Judgement: Decreased awareness of safety;Decreased awareness of deficits Awareness: Emergent Problem Solving: Decreased initiation;Requires verbal cues;Requires tactile cues;Difficulty sequencing General Comments: Pt impulsive with transfers despite education.    Exercises      General Comments General comments (skin integrity, edema, etc.): patient mumbling throughout with intermitent comprehensible statements.       Pertinent Vitals/Pain Pain Assessment: No/denies pain Faces Pain Scale: No hurt    Home Living Family/patient expects to be discharged to:: Private residence Living Arrangements: Spouse/significant other Available Help at Discharge: Family;Available 24 hours/day Type of Home: House Home Access: Stairs to enter Entrance Stairs-Rails: Right Home Layout: Two level Home Equipment: Walker - 2 wheels;Shower seat;Grab bars - tub/shower;Grab bars - toilet;Cane - single point      Prior  Function Level of Independence: Independent with assistive device(s)      Comments:  used cane up until a week ago started using RW. Wife reports pt independent with ADL.   PT Goals (current goals can now be found in the care plan section) Acute Rehab PT Goals Patient Stated Goal: get up and walk Progress towards PT goals: Progressing toward goals    Frequency    Min 3X/week      PT Plan      Co-evaluation PT/OT/SLP Co-Evaluation/Treatment: Yes Reason for Co-Treatment: Complexity of the patient's impairments (multi-system involvement);For patient/therapist safety PT goals addressed during session: Mobility/safety with mobility;Balance OT goals addressed during session: ADL's and self-care     End of Session Equipment Utilized During Treatment: Gait belt Activity Tolerance: Patient tolerated treatment well Patient left: with call bell/phone within reach;with family/visitor present;in chair;with chair alarm set   PT Visit Diagnosis: Other abnormalities of gait and mobility (R26.89);Other symptoms and signs involving the nervous system (R29.898)     Time: [id number] PT Time Calculation (min) (ACUTE ONLY): 23 min  Charges:  $Gait Training: 8-22 mins                    G Codes:       Reginia Naas Jul 17, 2016, 1:04 PM  Magda Kiel, Pablo 07-17-16

## 2016-07-14 NOTE — Clinical Social Work Note (Signed)
Clinical Social Work Assessment  Patient Details  Name: Clinton [name] MRN: [id number] Date of Birth: 06-Jul-1923  Date of referral:  07/14/16               Reason for consult:  Facility Placement                Permission sought to share information with:  Chartered certified accountant granted to share information::  Yes, Verbal Permission Granted  Name::     Engineer, manufacturing::  SNF  Relationship::  spouse  Contact Information:     Housing/Transportation Living arrangements for the past 2 months:  Single Family Home Source of Information:  Patient, Spouse Patient Interpreter Needed:  None Criminal Activity/Legal Involvement Pertinent to Current Situation/Hospitalization:  No - Comment as needed Significant Relationships:  Spouse Lives with:  Spouse Do you feel safe going back to the place where you live?  No Need for family participation in patient care:  Yes (Comment) (some help with decision making)  Care giving concerns:  Pt lives at home with elderly wife- wife endorses that she could not care for patient with current impairment.   Social Worker assessment / plan:  CSW met with pt and pt wife concerning PT recommendation for rehab at time of DC.  CSW explained CIR vs SNF and possible barriers to CIR placement.  Pt alert but only gave 1 word answers during interview while wife exhibited good understanding of the information.   Employment status:  Retired Nurse, adult PT Recommendations:  [place] / Referral to community resources:  [place]  Patient/Family's Response to care: pt and wife acknowledge need for rehab prior to return home.  Patient/Family's Understanding of and Emotional Response to Diagnosis, Current Treatment, and Prognosis:  No questions or concerns at this time- pt wife hopeful pt can return to baseline and be capable of returning home safely.  Emotional Assessment Appearance:   Appears stated age Attitude/Demeanor/Rapport:    Affect (typically observed):  Appropriate Orientation:  Oriented to Self, Oriented to Place, Oriented to Situation Alcohol / Substance use:  Not Applicable Psych involvement (Current and /or in the community):  No (Comment)  Discharge Needs  Concerns to be addressed:  Care Coordination Readmission within the last 30 days:  No Current discharge risk:  Physical Impairment Barriers to Discharge:  Continued Medical Work up   Jorge Ny, LCSW 07/14/2016, 3:18 PM

## 2016-07-14 NOTE — Progress Notes (Signed)
Rehab Admissions Coordinator Note:  Patient was screened by Cleatrice Burke for appropriateness for an Inpatient Acute Rehab Consult per PT recommendation. At this time, we are recommending Inpatient Rehab consult.  Cleatrice Burke 07/14/2016, 8:24 AM  I can be reached at [phone].

## 2016-07-14 NOTE — NC FL2 (Signed)
[place] LEVEL OF CARE SCREENING TOOL     IDENTIFICATION  Patient Name: Clinton [name] Birthdate: 06-03-1923 Sex: male Admission Date (Current Location): 07/13/2016  [facility] and [place] Number:  Herbalist and Address:  [facility], [place] [address] [place], [place] [address]      Provider Number: [id number]  Attending Physician Name and Address:  Geradine Girt, DO  Relative Name and Phone Number:       Current Level of Care: Hospital Recommended Level of Care: [place] Prior Approval Number:    Date Approved/Denied:   PASRR Number: [id number]  Discharge Plan: SNF    Current Diagnoses: Patient Active Problem List   Diagnosis Date Noted  . Dysarthria 07/13/2016  . Aspiration pneumonia ([place]) 07/13/2016  . Leukocytosis 07/13/2016  . HLD (hyperlipidemia) 07/13/2016  . Ischemic stroke ([place])   . Recurrent aspiration bronchitis/pneumonia ([place])   . Cerebral infarction due to thrombosis of basilar artery ([place]) 09/03/2014  . Diabetes mellitus type 2 in nonobese ([place]) 03/04/2014  . Dysphagia 03/04/2014  . CVA (cerebral vascular accident) ([place]) 11/26/2013  . CVA (cerebral infarction) 10/15/2013    Orientation RESPIRATION BLADDER Height & Weight     Self, Place, Situation  Normal Incontinent, External catheter Weight: 112 lb (50.8 kg) Height:     BEHAVIORAL SYMPTOMS/MOOD NEUROLOGICAL BOWEL NUTRITION STATUS      Incontinent Diet (see DC summary)  AMBULATORY STATUS COMMUNICATION OF NEEDS Skin   Limited Assist Verbally Normal                       Personal Care Assistance Level of Assistance  Bathing, Dressing Bathing Assistance: Limited assistance   Dressing Assistance: Limited assistance     Functional Limitations Info             SPECIAL CARE FACTORS FREQUENCY  PT (By licensed PT), OT (By licensed OT), Speech Therapy     PT Frequency: 5/wk OT Frequency: 5/wk  Speech: 2/wk for 2 weeks           Contractures      Additional Factors Info  Code Status, Insulin Sliding Scale, Isolation Precautions Code Status Info: DNR     Insulin Sliding Scale Info: 3/day Isolation Precautions Info: Droplet     Current Medications (07/14/2016):  This is the current hospital active medication list Current Facility-Administered Medications  Medication Dose Route Frequency Provider Last Rate Last Dose  . 0.9 %  sodium chloride infusion   Intravenous Continuous Samella Parr, NP 75 mL/hr at 07/13/16 2327    . acetaminophen (TYLENOL) tablet 650 mg  650 mg Oral Q4H PRN Samella Parr, NP       Or  . acetaminophen (TYLENOL) solution 650 mg  650 mg Per Tube Q4H PRN Samella Parr, NP       Or  . acetaminophen (TYLENOL) suppository 650 mg  650 mg Rectal Q4H PRN Samella Parr, NP      . Ampicillin-Sulbactam (UNASYN) 3 g in sodium chloride 0.9 % 100 mL IVPB  3 g Intravenous Q8H Rebecka Apley, RPH   3 g at 07/14/16 0931  . aspirin suppository 300 mg  300 mg Rectal Daily Samella Parr, NP   300 mg at 07/13/16 1003   Or  . aspirin tablet 325 mg  325 mg Oral Daily Samella Parr, NP   325 mg at 07/14/16 0931  . atorvastatin (LIPITOR) tablet  10 mg  10 mg Oral Daily Rosalin Hawking, MD   10 mg at 07/14/16 0931  . clopidogrel (PLAVIX) tablet 75 mg  75 mg Oral Daily Rosalin Hawking, MD   75 mg at 07/14/16 0931  . enoxaparin (LOVENOX) injection 30 mg  30 mg Subcutaneous Q24H Samella Parr, NP   30 mg at 07/14/16 1248  . insulin aspart (novoLOG) injection 0-9 Units  0-9 Units Subcutaneous TID WC Geradine Girt, DO         Discharge Medications: Please see discharge summary for a list of discharge medications.  Relevant Imaging Results:  Relevant Lab Results:   Additional Information SS#: [id number]  Jorge Ny, LCSW

## 2016-07-14 NOTE — Progress Notes (Signed)
CRITICAL VALUE ALERT  Critical value received:  BSG 66   Date of notification: 07/14/16  Time of notification: 0001  Critical value read back:yes,   Nurse who received alert:  Cordelia Pen  MD notified (1st page):   Time of first page:  0135  MD notified (2nd page):TRH 1  Time of second page:0228  Responding MD:    Time MD responded:

## 2016-07-14 NOTE — Progress Notes (Signed)
Pt was hypoglycemic with a bsg of 66 around 0050, protocol follow, pt was given oral intake of juice and snacks. Reccheck sugar at 0128 and bsg was 107, recheck bsg at 0228 and it was 147. MD attempted to be notified, pt has no complaints

## 2016-07-14 NOTE — Progress Notes (Signed)
I met with pt with his wife at bedside to discuss a possible inpt rehab admission pending [place] insurance approval. Both pt and wife prefer inpt rehab rather than SNF. I will begin insurance approval for possible admission to CIR when medically ready. [phone]

## 2016-07-14 NOTE — Progress Notes (Signed)
STROKE TEAM PROGRESS NOTE   HISTORY OF PRESENT ILLNESS (per record) Clinton [name] is an 83 y.o. male with medical history significant for CVA (2016) due tobasilar artery thrombosis with dysarthria and dysphagia, his lipidemia, diabetes on metformin. Wife reports that at previous stroke he presented with dysarthria and dysphagia and at one point was on a special diet with thickened liquids but has been on regular diet with thin liquids for "quite some time". The wife has noticed over the past 1 year progressive functional decline including difficulties with thin liquids that have worsened over the past week as well as significant worsening ofdysarthria over the past one week. He has had an unsteady gait and he has fallen at least once. He's hadto transition from a cane to a rolling walker. Currently he is very dysarthric. His last known well is unknown. Patient was not administered IV t-PA secondary to being out of window. He was admitted for further evaluation and treatment.   SUBJECTIVE (INTERVAL HISTORY) Pt sitting in chair for lunch and wife at bedside. Pt has severe dysarthria, worsening from before. He had stroke in 10/2013 and on plavix and lipitor.    OBJECTIVE Temp:  [97.4 F (36.3 C)-98.8 F (37.1 C)] 98.8 F (37.1 C) (02/28 0532) Pulse Rate:  [57-62] 60 (02/27 2230) Cardiac Rhythm: Normal sinus rhythm (02/28 0700) Resp:  [17-18] 18 (02/28 0532) BP: (106-153)/(61-69) 106/69 (02/28 0532) SpO2:  [98 %-100 %] 98 % (02/28 0532) Weight:  [50.8 kg (112 lb)] 50.8 kg (112 lb) (02/28 0532)  CBC:  Recent Labs Lab 07/13/16 0353  WBC 15.2*  NEUTROABS 13.7*  HGB 12.3*  HCT 37.5*  MCV 91.9  PLT [id number]    Basic Metabolic Panel:  Recent Labs Lab 07/13/16 0353  NA 141  K 4.2  CL 105  CO2 28  GLUCOSE 167*  BUN 22*  CREATININE 0.88  CALCIUM 9.4    Lipid Panel:    Component Value Date/Time   CHOL 121 07/14/2016 0507   TRIG 59 07/14/2016 0507   HDL 51 07/14/2016 0507   CHOLHDL  2.4 07/14/2016 0507   VLDL 12 07/14/2016 0507   LDLCALC 58 07/14/2016 0507   HgbA1c:  Lab Results  Component Value Date   HGBA1C 6.0 (H) 10/16/2013   Urine Drug Screen:    Component Value Date/Time   LABOPIA NONE DETECTED 10/15/2013 1913   COCAINSCRNUR NONE DETECTED 10/15/2013 1913   LABBENZ NONE DETECTED 10/15/2013 1913   AMPHETMU NONE DETECTED 10/15/2013 1913   THCU NONE DETECTED 10/15/2013 1913   LABBARB NONE DETECTED 10/15/2013 1913      IMAGING I have personally reviewed the radiological images below and agree with the radiology interpretations.  Ct Head Wo Contrast 07/13/2016 1. No evidence of traumatic intracranial injury or fracture. 2. No evidence of fracture or subluxation along the cervical spine. 3. Moderate cortical volume loss and scattered small vessel ischemic microangiopathy. 4. Chronic lacunar infarcts at the basal ganglia bilaterally, at the left thalamus, and at the right corona radiata. 5. Mild degenerative change along the cervical spine. 6. Mucus retention cyst or polyp at the right maxillary sinus. 7. Mild scarring at the lung apices. 8. Scattered calcification at the carotid bifurcations bilaterally. Carotid ultrasound would be helpful for further evaluation, when and as deemed clinically appropriate.   Ct Angio Head W Or Wo Contrast Ct Angio Neck W Or Wo Contrast 07/14/2016 1. No large vessel occlusion. 2. Intracranial atherosclerosis with a moderate distal right P2 stenosis. 3. No  significant proximal anterior circulation stenosis. 4. Mild bilateral cervical carotid artery atherosclerosis without stenosis. 5. Severe right vertebral artery origin stenosis. Bilateral V2 segment stenoses (severe on the left) due to degenerative spurring in the cervical spine. 6. Partially visualized left upper and lower lobe pulmonary consolidation concerning for pneumonia. Small pleural effusion.   Ct Cervical Spine Wo Contrast 07/13/2016 1. No evidence of traumatic  intracranial injury or fracture. 2. No evidence of fracture or subluxation along the cervical spine. 3. Moderate cortical volume loss and scattered small vessel ischemic microangiopathy. 4. Chronic lacunar infarcts at the basal ganglia bilaterally, at the left thalamus, and at the right corona radiata. 5. Mild degenerative change along the cervical spine. 6. Mucus retention cyst or polyp at the right maxillary sinus. 7. Mild scarring at the lung apices. 8. Scattered calcification at the carotid bifurcations bilaterally. Carotid ultrasound would be helpful for further evaluation, when and as deemed clinically appropriate.   Mr Brain Wo Contrast 07/13/2016 Subcentimeter area of acute infarction involving the superior lentiform nucleus and periventricular white matter, nonhemorrhagic. Atrophy and small vessel disease, multiple chronic lacunar infarcts, widespread microbleeds, similar to 2015.   2D echocardiogram - Left ventricle: The cavity size was normal. Wall thickness was increased in a pattern of moderate LVH. Systolic function was normal. The estimated ejection fraction was in the range of 55% to 60%. Wall motion was normal; there were no regional wall motion abnormalities. Doppler parameters are consistent with abnormal left ventricular relaxation (grade 1 diastolic dysfunction). The E/e&' ratio is between 8-15, suggesting indeterminate LV filling pressure. - Left atrium: Moderately dilated. - Right atrium: The atrium was mildly dilated. - Atrial septum: No defect or patent foramen ovale was identified. - Tricuspid valve: There was mild regurgitation. - Pulmonic valve: There was mild regurgitation. - Pulmonary arteries: PA peak pressure: 34 mm Hg (S). - Inferior vena cava: The vessel was normal in size. The respirophasic diameter changes were in the normal range (>= 50%), consistent with normal central venous pressure. Impressions:   LVEF 55-60%, moderate LVH, normal wall motion, diastolic  dysfunction, indeterminate LV filling pressure, modeate LAE, mild RAE, mild TR, mild PI, RVSP 34 mmHg, normal IVC.   PHYSICAL EXAM  Temp:  [98.5 F (36.9 C)-99.1 F (37.3 C)] 99.1 F (37.3 C) (02/28 1404) Pulse Rate:  [60-85] 85 (02/28 1404) Resp:  [17-18] 17 (02/28 1404) BP: (105-137)/(61-69) 105/61 (02/28 1404) SpO2:  [98 %-100 %] 98 % (02/28 1404) Weight:  [112 lb (50.8 kg)] 112 lb (50.8 kg) (02/28 0532)  General - Well nourished, well developed, in no apparent distress.  Ophthalmologic - Fundi not visualized due to noncooperation.  Cardiovascular - Regular rate and rhythm.  Mental Status -  Level of arousal and orientation to month, place, and person were intact, but not orientated to year. Language including expression, naming, repetition, comprehension was assessed and found intact, however, severe dysarthria.  Cranial Nerves II - XII - II - Visual field intact OU. III, IV, VI - Extraocular movements intact. V - Facial sensation intact bilaterally. VII - Facial movement intact bilaterally. VIII - Hearing & vestibular intact bilaterally. X - Palate elevates symmetrically, severe dysarthria. XI - Chin turning & shoulder shrug intact bilaterally. XII - Tongue protrusion intact.  Motor Strength - The patient's strength was normal in all extremities except RUE 4/5 which is old and pronator drift was present on the right.  Bulk was normal and fasciculations were absent.   Motor Tone - Muscle tone was assessed at  the neck and appendages and was normal.  Reflexes - The patient's reflexes were 1+ in all extremities and he had no pathological reflexes.  Sensory - Light touch, temperature/pinprick were assessed and were symmetrical.    Coordination - The patient had normal movements in the hands with no ataxia or dysmetria, but slow on action.  Tremor was absent.  Gait and Station - deferred   ASSESSMENT/PLAN Mr. WARN KAJIWARA is a 83 y.o. male with history of previous  stroke with resultant dysarthria and dysphagia, dyslipidemia, and diabetes presenting with progressive functional decline and worsening dysarthria 1 week. He did not receive IV t-PA due to delay in arrival.   Stroke:  Small left lentiform nucleus infarct secondary to small vessel disease source  Resultant - severe dysarthria and left UE weakness  CT head chronic lacunes: B BG, L thalamus, R corona radiata. B calcifications carotid bifurcations.  CTA head and neck no LVO. R P2 stenosis. Severe R VA origin stenosis. L>R severe V2 stenosis.   MRI  Small L lentiform nucleus infarct.   2D Echo  EF 55-60%. No source of embolus   LDL 58  HgbA1c pending  Lovenox 30 mg sq daily for VTE prophylaxis  Diet Carb Modified Fluid consistency: Thin; Room service appropriate? Yes  clopidogrel 75 mg daily prior to admission, now on aspirin 325 mg daily and clopidogrel 75 mg daily. Continue DAPT for 3 months and then plavix alone.   Patient counseled to be compliant with his antithrombotic medications  Ongoing aggressive stroke risk factor management  Therapy recommendations:  CIR  Disposition:  pending   Hypertension  Stable  Permissive hypertension (OK if < 220/120) but gradually normalize in 5-7 days  Long-term BP goal normotensive  Hyperlipidemia  Home meds:  lipitor 10, resumed in hospital  LDL 58, goal < 70  Continue statin at discharge  Diabetes  HgbA1c pending, goal < 7.0  SSI  CBG under control  Other Stroke Risk Factors  Advanced age  Hx of stroke  10/2013 - pontine infarct secondary to small vessel disease - on DAPT for 3 months and then plavix and lipitor  Other Active Problems  ? Aspiration pneumonia   Mild elevation in troponin   Hospital day # 0  Neurology will sign off. Please call with questions. Pt will follow up with Cecille Rubin NP at [facility] in about 6 weeks. Thanks for the consult.  Rosalin Hawking, MD PhD Stroke Neurology 07/14/2016 6:05  PM   To contact Stroke Continuity provider, please refer to [email or web address] After hours, contact General Neurology

## 2016-07-14 NOTE — Progress Notes (Signed)
  Speech Language Pathology Treatment: Dysphagia  Patient Details Name: Clinton [name] MRN: [id number] DOB: Dec 03, 1923 Today's Date: 07/14/2016 Time: [id number] SLP Time Calculation (min) (ACUTE ONLY): 9 min  Assessment / Plan / Recommendation Clinical Impression  [name] was able to recall the compensatory strategy that was implemented by SLP yesterday (chin tuck with thin liquids). Wife reported that pt has been doing well with this technique. Both pt and wife stated again that they do not want to resume thickened liquids; SLP reiterated that aspiration risk is still high. Observed pt with thin liquids via cup while utilizing chin tuck and noted intermittent immediate coughing (particularly when chin was not completely tucked and with large sips). Instructed pt to keep chin tucked while swallowing and emphasized the importance of taking small sips in conjunction with using the chin tuck to reduce the risk of aspiration. ST will f/u briefly for treatment to ensure compliance with compensatory strategy (chin tuck) and for continued education.   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 Wife reported that with previous stroke he presented with dysarthria and dysphagia and at one point was on a special diet with thickened liquids but has been on regular diet with thin liquids for "quite some time". The wife has noticed over the past 1 year progressive functional decline including difficulties with thin liquids that have worsened over the past week as well as significant worsening ofdysarthria over the past one week. He has had an unsteady gait and he has fallen at least once. She has noticed a wet sounding cough for at least one week. CXR showed mild bibasilar airspace opacities may reflect atelectasis or possibly mild pneumonia. MRI of brain showed subcentimeter area of  acute infarction involving the superior lentiform nucleus and periventricular white matter, nonhemorrhagic. Atrophy and small vessel disease, multiple chronic lacunar infarcts, widespread microbleeds, similar to 2015.      SLP Plan  Continue with current plan of care       Recommendations  Diet recommendations: Regular;Thin liquid;Other(comment) (CHIN TUCK WITH LIQUIDS) Liquids provided via: Cup;No straw Medication Administration: Whole meds with puree Supervision: Full supervision/cueing for compensatory strategies Compensations: Chin tuck;Slow rate;Small sips/bites                Oral Care Recommendations: Oral care BID Follow up Recommendations: Other (comment) (TBD) SLP Visit Diagnosis: Dysphagia, pharyngeal phase (R13.13) Plan: Continue with current plan of care       [place] , [place] 07/14/2016, 2:15 PM

## 2016-07-14 NOTE — Consult Note (Signed)
Physical Medicine and Rehabilitation Consult  Reason for Consult: Dysarthria and difficulty walking.  Referring Physician:  Dr.    [name] Mons: Clinton [name] is a 83 y.o. RH-male with history of T2DM, HTN, prior stroke with dysarthria, progressive decline in ambulation and swallow for the past year, who was admitted on 07/13/16 with one week history of worsening of dysarthria, dizzines and fall. He was found to have leucocytosis and started on IV Unasyn due to concerns of PNA. MRI brain revealed subcentimeter area of acute infarction involving the left superior lentiform nucleus and periventricular white matter, nonhemorrhagic, moderate volume loss and chronic bilateral lacunar infarcts bilateral basal ganglia. 2 D echo revealed LVEF 55-60% with moderate LAE, mild TVR and PVR and no wall abnormality.   CTA head/neck done this am. Swallow evaluation done signs of aspiration with thin but patient and wife have declined thickened liquids due to quality of life issues despite being educated on risk of aspiration/PNA. He has had issue with cough and follow up CXR shows evidence of worsening of left basilar opacity and also reported to be positive for H flu. PT evaluation done revealing decline in strength, poor posture, impulsivity, and difficulty walking.   According to wife, patient ambulated with walker in the home. He had memory problems that came and went. He was able to bathe himself, but needed some assistance with his shirt. Because of shoulder problems. He's had left shoulder surgery in the past and right shoulder needed surgery but did not have this due to his age. Patient does not recall his history of shoulder surgery  Review of Systems  Constitutional: Negative for malaise/fatigue.  HENT: Positive for hearing loss. Negative for tinnitus.   Eyes: Negative for blurred vision and double vision.  Respiratory: Negative for shortness of breath.   Cardiovascular: Negative for chest pain,  palpitations and leg swelling.  Gastrointestinal: Negative for abdominal pain, constipation, heartburn and nausea.  Genitourinary: Negative for dysuria and urgency.  Musculoskeletal: Negative for myalgias and neck pain.  Skin: Negative for itching and rash.  Neurological: Positive for speech change, focal weakness and weakness. Negative for dizziness and headaches.  Psychiatric/Behavioral: Negative for depression and suicidal ideas. The patient is not nervous/anxious.       Past Medical History:  Diagnosis Date  . Diabetes mellitus without complication ([place])   . Hypertension   . Stroke [facility])     Past Surgical History:  Procedure Laterality Date  . CARDIAC CATHETERIZATION    . EYE SURGERY      Family History  Problem Relation Age of Onset  . Cancer Mother     Social History:  Married. Independent with walker for past 3 years? He reports that he has never smoked. He has never used smokeless tobacco. He reports that he has a glass of wine or beer once a week and that he does not use drugs.    Allergies: No Known Allergies    Medications Prior to Admission  Medication Sig Dispense Refill  . atorvastatin (LIPITOR) 10 MG tablet TAKE 1 TABLET BY MOUTH DAILY. 90 tablet 3  . clopidogrel (PLAVIX) 75 MG tablet TAKE 1 TABLET BY MOUTH ONCE A DAY 90 tablet 1  . metFORMIN (GLUCOPHAGE) 500 MG tablet Take 500 mg by mouth daily.       Home: Home Living Family/patient expects to be discharged to:: Private residence Living Arrangements: Spouse/significant other Available Help at Discharge: Family, Available 24 hours/day Type of Home: House Home Access: Stairs  to enter Entrance Stairs-Number of Steps: 6 Entrance Stairs-Rails: Right Home Layout: Two level Alternate Level Stairs-Number of Steps:  (has stair lift) Bathroom Shower/Tub: Chiropodist: Standard Bathroom Accessibility: Yes Home Equipment: Environmental consultant - 2 wheels, Shower seat, Grab bars - tub/shower, Grab bars -  toilet, Cane - single point  Functional History: Prior Function Level of Independence: Independent with assistive device(s) Comments: used cane up until a week ago started using RW. Wife reports pt independent with ADL. Functional Status:  Mobility: Bed Mobility Overal bed mobility: Needs Assistance Bed Mobility: Supine to Sit Sidelying to sit: Min assist, Mod assist Supine to sit: Supervision, HOB elevated General bed mobility comments: impulsively getting up while therapist trying to make sure all lines safe for pt to come upright  Transfers Overall transfer level: Needs assistance Equipment used: Rolling walker (2 wheeled) Transfers: Sit to/from Stand Sit to Stand: Min assist, +2 safety/equipment Stand pivot transfers: Mod assist General transfer comment: Pt impulsive with transfers with 2 hands on RW to boost up. Ambulation/Gait Ambulation/Gait assistance: Min assist, +2 safety/equipment, Mod assist Ambulation Distance (Feet): 65 Feet Assistive device: Rolling walker (2 wheeled) Gait Pattern/deviations: Step-through pattern, Decreased stride length, Trunk flexed, Shuffle, Drifts right/left General Gait Details: skimming L foot on floor throughout and assist for walker due to veering R Gait velocity: decreased Gait velocity interpretation: Below normal speed for age/gender    ADL: ADL Overall ADL's : Needs assistance/impaired Eating/Feeding: NPO Grooming: Set up, Supervision/safety, Sitting, Wash/dry face, Oral care Upper Body Bathing: Minimal assistance, Sitting Lower Body Bathing: Moderate assistance, Sit to/from stand Upper Body Dressing : Minimal assistance, Sitting Upper Body Dressing Details (indicate cue type and reason): to don gown Lower Body Dressing: Moderate assistance, Sit to/from stand Lower Body Dressing Details (indicate cue type and reason): Pt able to adjust socks sitting EOB with min guard assist. Assist provided for sit to stand and standing balance,  anticipate would require assist to pull pants up in standing Toilet Transfer: Minimal assistance, +2 for safety/equipment, Ambulation, RW Toilet Transfer Details (indicate cue type and reason): Simulated by sit to stand from EOB with functional mobility in room Functional mobility during ADLs: Minimal assistance, +2 for safety/equipment, Rolling walker, Cueing for sequencing, Cueing for safety General ADL Comments: Pt veering to R side throughout mobility. Pt imulsive and needs physical assist to direct RW during mobility.  Cognition: Cognition Overall Cognitive Status: Impaired/Different from baseline Orientation Level: Oriented to person, Oriented to place, Oriented to situation, Disoriented to time Cognition Arousal/Alertness: Awake/alert Behavior During Therapy: Impulsive Overall Cognitive Status: Impaired/Different from baseline Area of Impairment: Attention, Memory, Following commands, Safety/judgement, Awareness, Problem solving Current Attention Level: Sustained Memory: Decreased short-term memory Following Commands: Follows one step commands inconsistently Safety/Judgement: Decreased awareness of safety, Decreased awareness of deficits Awareness: Emergent Problem Solving: Decreased initiation, Requires verbal cues, Requires tactile cues, Difficulty sequencing General Comments: Pt impulsive with transfers despite education.  Blood pressure 106/69, pulse 60, temperature 98.8 F (37.1 C), temperature source Oral, resp. rate 18, weight 50.8 kg (112 lb), SpO2 98 %. Physical Exam  Nursing note and vitals reviewed. Constitutional: He is oriented to person, place, and time. He appears well-developed. He appears cachectic.  HENT:  Head: Normocephalic and atraumatic.  Eyes: Conjunctivae are normal. Pupils are equal, round, and reactive to light.  Neck: Normal range of motion. Neck supple.  Cardiovascular: Normal rate and regular rhythm.   Respiratory: Effort normal. No stridor. He has  decreased breath sounds.  GI: Soft. Bowel sounds  are normal. He exhibits no distension. There is no tenderness.  Musculoskeletal: He exhibits no edema or tenderness.  Decreased ROM bilateral shoulders.   Neurological: He is alert and oriented to person, place, and time.  Right facial weakness with moderate to severe dysarthria.  Easily distracted but able to answer basic questions and follow simple commands.  Skin: Skin is warm and dry.  Motor strength is 4/5, bilateral deltoid, biceps, triceps, grip, hip flexor, knee extensor, ankle dorsiflexor. Sensation intact to light touch in bilateral upper and lower limbs. Oriented to person, place and time. Speech has moderately severe dysarthria  Results for orders placed or performed during the hospital encounter of 07/13/16 (from the past 24 hour(s))  Glucose, capillary     Status: Abnormal   Collection Time: 07/13/16  4:40 PM  Result Value Ref Range   Glucose-Capillary 106 (H) 65 - 99 mg/dL  Glucose, capillary     Status: Abnormal   Collection Time: 07/13/16  8:29 PM  Result Value Ref Range   Glucose-Capillary 142 (H) 65 - 99 mg/dL  Glucose, capillary     Status: None   Collection Time: 07/14/16 12:48 AM  Result Value Ref Range   Glucose-Capillary 66 65 - 99 mg/dL  Glucose, capillary     Status: Abnormal   Collection Time: 07/14/16  1:32 AM  Result Value Ref Range   Glucose-Capillary 107 (H) 65 - 99 mg/dL  Glucose, capillary     Status: Abnormal   Collection Time: 07/14/16  2:18 AM  Result Value Ref Range   Glucose-Capillary 140 (H) 65 - 99 mg/dL  Glucose, capillary     Status: Abnormal   Collection Time: 07/14/16  3:59 AM  Result Value Ref Range   Glucose-Capillary 108 (H) 65 - 99 mg/dL  Lipid panel     Status: None   Collection Time: 07/14/16  5:07 AM  Result Value Ref Range   Cholesterol 121 0 - 200 mg/dL   Triglycerides 59 <150 mg/dL   HDL 51 >40 mg/dL   Total CHOL/HDL Ratio 2.4 RATIO   VLDL 12 0 - 40 mg/dL   LDL  Cholesterol 58 0 - 99 mg/dL  Glucose, capillary     Status: Abnormal   Collection Time: 07/14/16  8:02 AM  Result Value Ref Range   Glucose-Capillary 135 (H) 65 - 99 mg/dL  Glucose, capillary     Status: Abnormal   Collection Time: 07/14/16 12:23 PM  Result Value Ref Range   Glucose-Capillary 114 (H) 65 - 99 mg/dL   Comment 1 Document in Chart    Ct Angio Head W Or Wo Contrast  Result Date: 07/14/2016 CLINICAL DATA:  Acute left basal ganglia/ deep white matter infarct on MRI. EXAM: CT ANGIOGRAPHY HEAD AND NECK TECHNIQUE: Multidetector CT imaging of the head and neck was performed using the standard protocol during bolus administration of intravenous contrast. Multiplanar CT image reconstructions and MIPs were obtained to evaluate the vascular anatomy. Carotid stenosis measurements (when applicable) are obtained utilizing NASCET criteria, using the distal internal carotid diameter as the denominator. CONTRAST:  50 mL Isovue 370 COMPARISON:  Head MRI and CT 07/13/2016.  Head MRA 10/16/2013. FINDINGS: CT HEAD FINDINGS Brain: Subcentimeter hypodensity in the left corona radiata corresponds to the acute lacunar infarct on MRI. The multiple chronic lacunar infarcts are again seen involving the basal ganglia and thalami bilaterally. There is moderate cerebral atrophy. Cerebral white matter hypodensities are compatible with moderate chronic small vessel ischemic disease as seen on MRI.  There is no evidence of acute intracranial hemorrhage, mass, midline shift, or extra-axial fluid collection. Vascular: No hyperdense vessel.  Calcified atherosclerosis. Skull: No fracture focal osseous lesion. Sinuses: Bilateral maxillary sinus mucous retention cysts, right larger than left. Clear mastoid air cells. Cerumen in the external auditory canals. Orbits: Bilateral cataract extraction. Review of the MIP images confirms the above findings CTA NECK FINDINGS Aortic arch: Standard 3 vessel aortic arch. Mild calcified and  soft plaque in the aortic arch. Mild calcified plaque in the subclavian arteries without stenosis. Right carotid system: Patent without evidence of dissection or significant stenosis. Mild calcified plaque in the distal common carotid artery and about the carotid bifurcation. Left carotid system: Patent without evidence of dissection or significant stenosis. Mild calcified plaque about the carotid bifurcation. Vertebral arteries: Patent and codominant. Calcified plaque at the right vertebral artery origin results in severe stenosis. Degenerative spurring in the cervical spine and results in mild right greater than left vertebral artery stenosis at C6-7 and severe left vertebral artery stenosis at C3-4. Skeleton: Advanced multilevel disc and facet degeneration in the cervical spine. Severe bilateral neural foraminal stenosis at C3-4. Other neck: Scattered venous gas, likely iatrogenic. Upper chest: Mild biapical scarring. Partially visualized consolidation in the posterior left upper lobe and superior segment of the left lower lobe. Partially visualized small left pleural effusion. Review of the MIP images confirms the above findings CTA HEAD FINDINGS Anterior circulation: Internal carotid arteries are patent from skullbase to carotid termini. There is relatively diffuse carotid siphon calcification bilaterally but no significant stenosis. ACAs and MCAs are patent with mild branch vessel irregularity but no evidence of major branch occlusion or significant proximal stenosis. There is a small anterior communicating artery. No aneurysm. Posterior circulation: Intracranial vertebral arteries are patent and codominant with mild atherosclerosis but no significant stenosis. Left PICA, bilateral AICAs, and bilateral SCAs are visualized. Basilar artery is widely patent. Posterior communicating arteries are not identified. PCAs are patent without evidence of significant proximal stenosis, however there are distal P2 stenoses  which are moderate on the right and mild on the left, stable to mildly increased. No aneurysm. Venous sinuses: Patent. Anatomic variants: None. Delayed phase: No abnormal enhancement. Review of the MIP images confirms the above findings IMPRESSION: 1. No large vessel occlusion. 2. Intracranial atherosclerosis with a moderate distal right P2 stenosis. 3. No significant proximal anterior circulation stenosis. 4. Mild bilateral cervical carotid artery atherosclerosis without stenosis. 5. Severe right vertebral artery origin stenosis. Bilateral V2 segment stenoses (severe on the left) due to degenerative spurring in the cervical spine. 6. Partially visualized left upper and lower lobe pulmonary consolidation concerning for pneumonia. Small pleural effusion. Electronically Signed   By: Logan Bores M.D.   On: 07/14/2016 09:35   Dg Chest 2 View  Result Date: 07/14/2016 CLINICAL DATA:  Cough. EXAM: CHEST  2 VIEW COMPARISON:  Radiographs of July 13, 2016. FINDINGS: Stable cardiomediastinal silhouette. No pneumothorax is noted. No pleural effusion is noted. Multilevel degenerative disc disease is noted in lower thoracic spine. Minimal right basilar scarring or subsegmental atelectasis is noted. Increased left basilar opacity is noted concerning for worsening infiltrate or pneumonia. IMPRESSION: Increase left basilar opacity is noted concerning for worsening infiltrate or pneumonia. Followup PA and lateral chest X-ray is recommended in 3-4 weeks following trial of antibiotic therapy to ensure resolution and exclude underlying malignancy. Electronically Signed   By: Marijo Conception, M.D.   On: 07/14/2016 08:33   Dg Chest 2 View  Result  Date: 07/13/2016 CLINICAL DATA:  Status post fall. Altered mental status and slurred speech. Initial encounter. EXAM: CHEST  2 VIEW COMPARISON:  Chest radiograph performed 10/15/2013 FINDINGS: The lungs are well-aerated. Mild bibasilar opacities may reflect atelectasis or possibly  mild pneumonia. There is no evidence of pleural effusion or pneumothorax. The heart is borderline normal in size. No acute osseous abnormalities are seen. IMPRESSION: Mild bibasilar airspace opacities may reflect atelectasis or possibly mild pneumonia. No displaced rib fracture seen. Electronically Signed   By: Garald Balding M.D.   On: 07/13/2016 03:17   Ct Head Wo Contrast  Result Date: 07/13/2016 CLINICAL DATA:  Status post fall while going to bathroom. Hit head. Slurred speech over the past few weeks. Concern for cervical spine injury. Initial encounter. EXAM: CT HEAD WITHOUT CONTRAST CT CERVICAL SPINE WITHOUT CONTRAST TECHNIQUE: Multidetector CT imaging of the head and cervical spine was performed following the standard protocol without intravenous contrast. Multiplanar CT image reconstructions of the cervical spine were also generated. COMPARISON:  MRI/MRA of the brain performed 10/16/2013, and CT of the head performed 10/15/2013 FINDINGS: CT HEAD FINDINGS Brain: No evidence of acute infarction, hemorrhage, hydrocephalus, extra-axial collection or mass lesion/mass effect. Prominence of the ventricles and sulci reflects moderate cortical volume loss. Mild cerebellar atrophy is noted. Scattered periventricular and subcortical white matter change likely reflects small vessel ischemic microangiopathy. Chronic lacunar infarcts are seen at the basal ganglia bilaterally, and at the left thalamus. A chronic lacunar infarct is also noted at the right corona radiata. The brainstem and fourth ventricle are within normal limits. The cerebral hemispheres demonstrate grossly normal gray-white differentiation. No mass effect or midline shift is seen. Vascular: No hyperdense vessel or unexpected calcification. Skull: There is no evidence of fracture; visualized osseous structures are unremarkable in appearance. Sinuses/Orbits: The orbits are within normal limits. A mucus retention cyst or polyp is noted at the right  maxillary sinus. The remaining paranasal sinuses and mastoid air cells are well-aerated. Other: No significant soft tissue abnormalities are seen. CT CERVICAL SPINE FINDINGS Alignment: Normal. Skull base and vertebrae: No acute fracture. No primary bone lesion or focal pathologic process. Degenerative change is noted about the dens. Soft tissues and spinal canal: No prevertebral fluid or swelling. No visible canal hematoma. Disc levels: Minimal disc space narrowing is noted at multiple levels along the cervical spine, with scattered anterior and posterior disc osteophyte complexes, and mild underlying facet disease. Upper chest: Mild scarring is noted at the lung apices. The thyroid gland is diminutive and grossly unremarkable in appearance. Scattered calcification is seen at the carotid bifurcations bilaterally. Other: No additional soft tissue abnormalities are seen. IMPRESSION: 1. No evidence of traumatic intracranial injury or fracture. 2. No evidence of fracture or subluxation along the cervical spine. 3. Moderate cortical volume loss and scattered small vessel ischemic microangiopathy. 4. Chronic lacunar infarcts at the basal ganglia bilaterally, at the left thalamus, and at the right corona radiata. 5. Mild degenerative change along the cervical spine. 6. Mucus retention cyst or polyp at the right maxillary sinus. 7. Mild scarring at the lung apices. 8. Scattered calcification at the carotid bifurcations bilaterally. Carotid ultrasound would be helpful for further evaluation, when and as deemed clinically appropriate. Electronically Signed   By: Garald Balding M.D.   On: 07/13/2016 04:09   Ct Angio Neck W Or Wo Contrast  Result Date: 07/14/2016 CLINICAL DATA:  Acute left basal ganglia/ deep white matter infarct on MRI. EXAM: CT ANGIOGRAPHY HEAD AND NECK TECHNIQUE: Multidetector  CT imaging of the head and neck was performed using the standard protocol during bolus administration of intravenous contrast.  Multiplanar CT image reconstructions and MIPs were obtained to evaluate the vascular anatomy. Carotid stenosis measurements (when applicable) are obtained utilizing NASCET criteria, using the distal internal carotid diameter as the denominator. CONTRAST:  50 mL Isovue 370 COMPARISON:  Head MRI and CT 07/13/2016.  Head MRA 10/16/2013. FINDINGS: CT HEAD FINDINGS Brain: Subcentimeter hypodensity in the left corona radiata corresponds to the acute lacunar infarct on MRI. The multiple chronic lacunar infarcts are again seen involving the basal ganglia and thalami bilaterally. There is moderate cerebral atrophy. Cerebral white matter hypodensities are compatible with moderate chronic small vessel ischemic disease as seen on MRI. There is no evidence of acute intracranial hemorrhage, mass, midline shift, or extra-axial fluid collection. Vascular: No hyperdense vessel.  Calcified atherosclerosis. Skull: No fracture focal osseous lesion. Sinuses: Bilateral maxillary sinus mucous retention cysts, right larger than left. Clear mastoid air cells. Cerumen in the external auditory canals. Orbits: Bilateral cataract extraction. Review of the MIP images confirms the above findings CTA NECK FINDINGS Aortic arch: Standard 3 vessel aortic arch. Mild calcified and soft plaque in the aortic arch. Mild calcified plaque in the subclavian arteries without stenosis. Right carotid system: Patent without evidence of dissection or significant stenosis. Mild calcified plaque in the distal common carotid artery and about the carotid bifurcation. Left carotid system: Patent without evidence of dissection or significant stenosis. Mild calcified plaque about the carotid bifurcation. Vertebral arteries: Patent and codominant. Calcified plaque at the right vertebral artery origin results in severe stenosis. Degenerative spurring in the cervical spine and results in mild right greater than left vertebral artery stenosis at C6-7 and severe left  vertebral artery stenosis at C3-4. Skeleton: Advanced multilevel disc and facet degeneration in the cervical spine. Severe bilateral neural foraminal stenosis at C3-4. Other neck: Scattered venous gas, likely iatrogenic. Upper chest: Mild biapical scarring. Partially visualized consolidation in the posterior left upper lobe and superior segment of the left lower lobe. Partially visualized small left pleural effusion. Review of the MIP images confirms the above findings CTA HEAD FINDINGS Anterior circulation: Internal carotid arteries are patent from skullbase to carotid termini. There is relatively diffuse carotid siphon calcification bilaterally but no significant stenosis. ACAs and MCAs are patent with mild branch vessel irregularity but no evidence of major branch occlusion or significant proximal stenosis. There is a small anterior communicating artery. No aneurysm. Posterior circulation: Intracranial vertebral arteries are patent and codominant with mild atherosclerosis but no significant stenosis. Left PICA, bilateral AICAs, and bilateral SCAs are visualized. Basilar artery is widely patent. Posterior communicating arteries are not identified. PCAs are patent without evidence of significant proximal stenosis, however there are distal P2 stenoses which are moderate on the right and mild on the left, stable to mildly increased. No aneurysm. Venous sinuses: Patent. Anatomic variants: None. Delayed phase: No abnormal enhancement. Review of the MIP images confirms the above findings IMPRESSION: 1. No large vessel occlusion. 2. Intracranial atherosclerosis with a moderate distal right P2 stenosis. 3. No significant proximal anterior circulation stenosis. 4. Mild bilateral cervical carotid artery atherosclerosis without stenosis. 5. Severe right vertebral artery origin stenosis. Bilateral V2 segment stenoses (severe on the left) due to degenerative spurring in the cervical spine. 6. Partially visualized left upper  and lower lobe pulmonary consolidation concerning for pneumonia. Small pleural effusion. Electronically Signed   By: Logan Bores M.D.   On: 07/14/2016 09:35   Ct  Cervical Spine Wo Contrast  Result Date: 07/13/2016 CLINICAL DATA:  Status post fall while going to bathroom. Hit head. Slurred speech over the past few weeks. Concern for cervical spine injury. Initial encounter. EXAM: CT HEAD WITHOUT CONTRAST CT CERVICAL SPINE WITHOUT CONTRAST TECHNIQUE: Multidetector CT imaging of the head and cervical spine was performed following the standard protocol without intravenous contrast. Multiplanar CT image reconstructions of the cervical spine were also generated. COMPARISON:  MRI/MRA of the brain performed 10/16/2013, and CT of the head performed 10/15/2013 FINDINGS: CT HEAD FINDINGS Brain: No evidence of acute infarction, hemorrhage, hydrocephalus, extra-axial collection or mass lesion/mass effect. Prominence of the ventricles and sulci reflects moderate cortical volume loss. Mild cerebellar atrophy is noted. Scattered periventricular and subcortical white matter change likely reflects small vessel ischemic microangiopathy. Chronic lacunar infarcts are seen at the basal ganglia bilaterally, and at the left thalamus. A chronic lacunar infarct is also noted at the right corona radiata. The brainstem and fourth ventricle are within normal limits. The cerebral hemispheres demonstrate grossly normal gray-white differentiation. No mass effect or midline shift is seen. Vascular: No hyperdense vessel or unexpected calcification. Skull: There is no evidence of fracture; visualized osseous structures are unremarkable in appearance. Sinuses/Orbits: The orbits are within normal limits. A mucus retention cyst or polyp is noted at the right maxillary sinus. The remaining paranasal sinuses and mastoid air cells are well-aerated. Other: No significant soft tissue abnormalities are seen. CT CERVICAL SPINE FINDINGS Alignment: Normal.  Skull base and vertebrae: No acute fracture. No primary bone lesion or focal pathologic process. Degenerative change is noted about the dens. Soft tissues and spinal canal: No prevertebral fluid or swelling. No visible canal hematoma. Disc levels: Minimal disc space narrowing is noted at multiple levels along the cervical spine, with scattered anterior and posterior disc osteophyte complexes, and mild underlying facet disease. Upper chest: Mild scarring is noted at the lung apices. The thyroid gland is diminutive and grossly unremarkable in appearance. Scattered calcification is seen at the carotid bifurcations bilaterally. Other: No additional soft tissue abnormalities are seen. IMPRESSION: 1. No evidence of traumatic intracranial injury or fracture. 2. No evidence of fracture or subluxation along the cervical spine. 3. Moderate cortical volume loss and scattered small vessel ischemic microangiopathy. 4. Chronic lacunar infarcts at the basal ganglia bilaterally, at the left thalamus, and at the right corona radiata. 5. Mild degenerative change along the cervical spine. 6. Mucus retention cyst or polyp at the right maxillary sinus. 7. Mild scarring at the lung apices. 8. Scattered calcification at the carotid bifurcations bilaterally. Carotid ultrasound would be helpful for further evaluation, when and as deemed clinically appropriate. Electronically Signed   By: Garald Balding M.D.   On: 07/13/2016 04:09   Mr Brain Wo Contrast  Result Date: 07/13/2016 CLINICAL DATA:  Status post fall prior to admission. Dizziness. Slurred speech for weeks. Stroke risk factors include diabetes, hypertension, and previous stroke. EXAM: MRI HEAD WITHOUT CONTRAST TECHNIQUE: Multiplanar, multiecho pulse sequences of the brain and surrounding structures were obtained without intravenous contrast. COMPARISON:  CT head 07/13/2016.  MR head 10/16/2013. FINDINGS: Brain: Subcentimeter area of acute infarction involves the LEFT lentiform  nucleus superiorly, extending to the periventricular white matter. No acute hemorrhage, mass lesion, or extra-axial fluid. Advanced atrophy. Hydrocephalus ex vacuo. Extensive chronic microvascular ischemic change. Prominent perivascular spaces reflective of hypertension. Widespread areas of chronic lacunar infarction involving the brainstem, deep nuclei, and periventricular white matter, similar to priors. Numerous foci of susceptibility reflecting chronic hemorrhage  related to ischemia in the posterior limb internal capsule, RIGHT thalamus, RIGHT hippocampus, and LEFT dentate nucleus all likely sequelae of hypertensive cerebrovascular disease. They appear more numerous/larger on today's exam compared with priors due to today's study performed on 3T scanner. Vascular: Normal flow voids. Skull and upper cervical spine: Normal marrow signal. Extensive pannus projects dorsally from the C1-C2 articulation, narrowing the cervicomedullary junction. Upper cervical cord compression is not established. Sinuses/Orbits: No acute findings. BILATERAL cataract extraction. RIGHT maxillary sinus retention cyst. Other: None. Compared with recent CT, the acute infarct is difficult to visualize. Chronic ischemic changes are similar to 2015. IMPRESSION: Subcentimeter area of acute infarction involving the superior lentiform nucleus and periventricular white matter, nonhemorrhagic. Atrophy and small vessel disease, multiple chronic lacunar infarcts, widespread microbleeds, similar to 2015. Electronically Signed   By: Staci Righter M.D.   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 Does the need for close, 24 hr/day medical supervision in concert with the patient's rehab needs make it unreasonable for this patient to be served in a less intensive setting? Yes 2. Co-Morbidities requiring  supervision/potential complications: May have influenza, history of prior bilateral basal ganglia infarcts, type 2 diabetes, hypertension 3. Due to bladder management, bowel management, safety, skin/wound care, disease management, medication administration, pain management and patient education, does the patient require 24 hr/day rehab nursing? Yes 4. Does the patient require coordinated care of a physician, rehab nurse, PT (1-2 hrs/day, 5 days/week), OT (1-2 hrs/day, 5 days/week) and SLP (.5-1 hrs/day, 5 days/week) to address physical and functional deficits in the context of the above medical diagnosis(es)? Yes Addressing deficits in the following areas: balance, endurance, locomotion, strength, transferring, bowel/bladder control, bathing, dressing, feeding, grooming, toileting, cognition, speech, swallowing and psychosocial support 5. Can the patient actively participate in an intensive therapy program of at least 3 hrs of therapy per day at least 5 days per week? Potentially 6. The potential for patient to make measurable gains while on inpatient rehab is good 7. Anticipated functional outcomes upon discharge from inpatient rehab are supervision  with PT, supervision with OT, supervision with SLP. 8. Estimated rehab length of stay to reach the above functional goals is: 12-16d 9. Does the patient have adequate social supports and living environment to accommodate these discharge functional goals? Yes 10. Anticipated D/C setting: Home 11. Anticipated post D/C treatments: [place] therapy 12. Overall Rehab/Functional Prognosis: good and fair  RECOMMENDATIONS: This patient's condition is appropriate for continued rehabilitative care in the following setting: [facility] Patient has agreed to participate in recommended program. Yes Note that insurance prior authorization may be required for reimbursement for recommended care.  Comment: Spoke with Dr. Eliseo Squires thinks patient should be medically ready in a couple  days   Charlett Blake, MD 07/14/2016

## 2016-07-15 LAB — CBC
HEMATOCRIT: 33.5 % — AB (ref 39.0–52.0)
HEMOGLOBIN: 11 g/dL — AB (ref 13.0–17.0)
MCH: 29.8 pg (ref 26.0–34.0)
MCHC: 32.8 g/dL (ref 30.0–36.0)
MCV: 90.8 fL (ref 78.0–100.0)
Platelets: 173 10*3/uL (ref 150–400)
RBC: 3.69 MIL/uL — ABNORMAL LOW (ref 4.22–5.81)
RDW: 12.5 % (ref 11.5–15.5)
WBC: 9.9 10*3/uL (ref 4.0–10.5)

## 2016-07-15 LAB — BASIC METABOLIC PANEL
ANION GAP: 5 (ref 5–15)
BUN: 14 mg/dL (ref 6–20)
CO2: 25 mmol/L (ref 22–32)
Calcium: 8.6 mg/dL — ABNORMAL LOW (ref 8.9–10.3)
Chloride: 106 mmol/L (ref 101–111)
Creatinine, Ser: 0.89 mg/dL (ref 0.61–1.24)
GFR calc Af Amer: >60 mL/min (ref >60)
GFR calc non Af Amer: >60 mL/min (ref >60)
GLUCOSE: 92 mg/dL (ref 65–99)
Potassium: 3.7 mmol/L (ref 3.5–5.1)
Sodium: 136 mmol/L (ref 135–145)

## 2016-07-15 LAB — GLUCOSE, CAPILLARY
Glucose-Capillary: 90 mg/dL (ref 65–99)
Glucose-Capillary: 134 mg/dL — ABNORMAL HIGH (ref 65–99)
Glucose-Capillary: 128 mg/dL — ABNORMAL HIGH (ref 65–99)
Glucose-Capillary: 96 mg/dL (ref 65–99)

## 2016-07-15 LAB — HEMOGLOBIN A1C
HEMOGLOBIN A1C: 5.8 % — AB (ref 4.8–5.6)
MEAN PLASMA GLUCOSE: 120 mg/dL

## 2016-07-15 LAB — PROCALCITONIN: Procalcitonin: <0.1 ng/mL

## 2016-07-15 MED ORDER — AMOXICILLIN-POT CLAVULANATE 500-125 MG PO TABS
1.0000 | ORAL_TABLET | Freq: Two times a day (BID) | ORAL | Status: DC
  Administered 2016-07-15 – 2016-07-16 (×3): 500 mg via ORAL
  Filled 2016-07-15 (×3): qty 1

## 2016-07-15 NOTE — Progress Notes (Signed)
CSW provided pt and pt wife with list of SNF options- they continue to prefer [facility] as first choice but will look over SNF options in case [facility] unable to admit.  Jorge Ny, LCSW Clinical Social Worker [phone]

## 2016-07-15 NOTE — Progress Notes (Signed)
CSW spoke with pt and pt spouse regarding SNF offers- they would like [facility] near their home  Plan for DC tomorrow- confirmed facility can take tomorrow  CSW will continue to follow  Jorge Ny, [place] Social Worker [phone]

## 2016-07-15 NOTE — Progress Notes (Signed)
PROGRESS NOTE    Clinton [name]  [id number] DOB: 1923/10/12 DOA: 07/13/2016 PCP: Carlena Sax, MD   Outpatient Specialists:    Brief Narrative:   Clinton [name] is a 83 y.o. male who presents with recurrent stroke and concern for progressive dysphagia and aspiration pneumonia. Will change ABX from CTX/Azithro to Unasyn. Additionally SSI for glucose control though pt only on metformin and last A1c in 2015 in pre-DM range (6.0).    Assessment & Plan:   Principal Problem:   CVA (cerebral vascular accident) ([place]) Active Problems:   Diabetes mellitus type 2 in nonobese (HCC)   Dysphagia   Dysarthria   Aspiration pneumonia (HCC)   Leukocytosis   HLD (hyperlipidemia)   Ischemic stroke ([place])   Recurrent aspiration bronchitis/pneumonia (HCC)   CVA (cerebral vascular accident)  -Patient presents after fall with progressive issues with dysarthria and dysphagia with MRI revealing positive subcentimeter acute infarct -History of prior CVA 2 years ago with similar symptoms and presentation -Ischemic stroke order set initiated -Echocardiogram:LVEF 55-60%, moderate LVH, normal wall motion, diastolic   dysfunction, indeterminate LV filling pressure, modeate LAE, mild   RAE, mild TR, mild PI, RVSP 34 mmHg, normal IVC.   -CT angio -SLP/PT/OT- CIR but insurance denied so will work on SNF -Lipid panel: LD 58 -now on aspirin 325 mg daily and clopidogrel 75 mg daily. Continue DAPT for 3 months and then plavix alone.      Aspiration pneumonia /history of Dysphagia -Patient presents with history of progressive worsening of dysphagia symptoms with more acute worsening of symptoms in the past 1 week with evidence of stroke -seen by SLP: regular diet -change IV abx to PO augmentin and watch for fever -appears to be at risk for chronic aspiration -Followup PA and lateral chest X-ray is recommended in 3-4 weeks following trial of antibiotic therapy to ensure resolution and exclude underlying  malignancy.   Mild elevation in troponin -No reports of chest pain -Likely demand ischemia in setting of acute CVA as well as aspiration pneumonitis    Leukocytosis -Likely secondary to dehydration and acute aspiration pneumonitis -acquired pneumonia (see above) -Blood cultures- NGTD -unasyn- change to PO augmentin    Diabetes mellitus type 2 in nonobese  -SSI    HLD (hyperlipidemia) -resume home med   DVT prophylaxis:  Lovenox   Code Status: DNR   Family Communication: Wife 2/28   Disposition Plan:  SNF in AM if afebrile   Consultants:   Neuro  CIR      Subjective: Speech difficult to understand  Objective: Vitals:   07/14/16 0532 07/14/16 1404 07/14/16 2042 07/15/16 0549  BP: 106/69 105/61 (!) 152/71 (!) 155/80  Pulse:  85 78 (!) 58  Resp: 18 17 18 18   Temp: 98.8 F (37.1 C) 99.1 F (37.3 C)  98.4 F (36.9 C)  TempSrc: Oral Oral  Oral  SpO2: 98% 98% 98% 98%  Weight: 50.8 kg (112 lb)       Intake/Output Summary (Last 24 hours) at 07/15/16 1337 Last data filed at 07/15/16 1003  Gross per 24 hour  Intake              540 ml  Output             1300 ml  Net             -760 ml   Filed Weights   07/14/16 0532  Weight: 50.8 kg (112 lb)    Examination:  General exam: Appears  calm and comfortable  Respiratory system: diminished, no wheezing Cardiovascular system: S1 & S2 heard, RRR. No JVD, murmurs, rubs, gallops or clicks. No pedal edema. Gastrointestinal system: Abdomen is nondistended, soft and nontender. No organomegaly or masses felt. Normal bowel sounds heard. Central nervous system: Alert and oriented. No focal neurological deficits.     Data Reviewed: I have personally reviewed following labs and imaging studies  CBC:  Recent Labs Lab 07/13/16 0353 07/15/16 0501  WBC 15.2* 9.9  NEUTROABS 13.7*  --   HGB 12.3* 11.0*  HCT 37.5* 33.5*  MCV 91.9 90.8  PLT 182 [id number]   Basic Metabolic Panel:  Recent Labs Lab  07/13/16 0353 07/15/16 0501  NA 141 136  K 4.2 3.7  CL 105 106  CO2 28 25  GLUCOSE 167* 92  BUN 22* 14  CREATININE 0.88 0.89  CALCIUM 9.4 8.6*   GFR: CrCl cannot be calculated (Unknown ideal weight.). Liver Function Tests:  Recent Labs Lab 07/13/16 0353  AST 21  ALT 12*  ALKPHOS 62  BILITOT 0.5  PROT 6.4*  ALBUMIN 3.3*   No results for input(s): LIPASE, AMYLASE in the last 168 hours. No results for input(s): AMMONIA in the last 168 hours. Coagulation Profile:  Recent Labs Lab 07/13/16 0353  INR 1.04   Cardiac Enzymes:  Recent Labs Lab 07/13/16 0353 07/13/16 0833  TROPONINI 0.04* 0.04*   BNP (last 3 results) No results for input(s): PROBNP in the last 8760 hours. HbA1C:  Recent Labs  07/14/16 0507  HGBA1C 5.8*   CBG:  Recent Labs Lab 07/14/16 1223 07/14/16 1715 07/14/16 2107 07/15/16 0812 07/15/16 1203  GLUCAP 114* 105* 125* 90 134*   Lipid Profile:  Recent Labs  07/14/16 0507  CHOL 121  HDL 51  LDLCALC 58  TRIG 59  CHOLHDL 2.4   Thyroid Function Tests: No results for input(s): TSH, T4TOTAL, FREET4, T3FREE, THYROIDAB in the last 72 hours. Anemia Panel: No results for input(s): VITAMINB12, FOLATE, FERRITIN, TIBC, IRON, RETICCTPCT in the last 72 hours. Urine analysis:    Component Value Date/Time   COLORURINE YELLOW 07/13/2016 0521   APPEARANCEUR CLEAR 07/13/2016 0521   LABSPEC 1.021 07/13/2016 0521   PHURINE 8.0 07/13/2016 0521   GLUCOSEU 50 (A) 07/13/2016 0521   HGBUR NEGATIVE 07/13/2016 0521   BILIRUBINUR NEGATIVE 07/13/2016 0521   KETONESUR NEGATIVE 07/13/2016 0521   PROTEINUR 30 (A) 07/13/2016 0521   UROBILINOGEN 0.2 10/15/2013 1913   NITRITE NEGATIVE 07/13/2016 0521   LEUKOCYTESUR NEGATIVE 07/13/2016 0521     ) Recent Results (from the past 240 hour(s))  Culture, blood (routine x 2) Call MD if unable to obtain prior to antibiotics being given     Status: None (Preliminary result)   Collection Time: 07/13/16  7:30 AM   Result Value Ref Range Status   Specimen Description BLOOD RIGHT WRIST  Final   Special Requests BOTTLES DRAWN AEROBIC AND ANAEROBIC  5CC  Final   Culture NO GROWTH 2 DAYS  Final   Report Status PENDING  Incomplete  Culture, blood (routine x 2) Call MD if unable to obtain prior to antibiotics being given     Status: None (Preliminary result)   Collection Time: 07/13/16  7:38 AM  Result Value Ref Range Status   Specimen Description BLOOD LEFT HAND  Final   Special Requests BOTTLES DRAWN AEROBIC ONLY  10CC  Final   Culture NO GROWTH 2 DAYS  Final   Report Status PENDING  Incomplete  Anti-infectives    Start     Dose/Rate Route Frequency Ordered Stop   07/15/16 1000  amoxicillin-clavulanate (AUGMENTIN) 500-125 MG per tablet 500 mg     1 tablet Oral 2 times daily 07/15/16 0853 07/20/16 0959   07/14/16 0600  cefTRIAXone (ROCEPHIN) 1 g in dextrose 5 % 50 mL IVPB  Status:  Discontinued     1 g 100 mL/hr over 30 Minutes Intravenous Every 24 hours 07/13/16 0716 07/13/16 0833   07/13/16 0900  Ampicillin-Sulbactam (UNASYN) 3 g in sodium chloride 0.9 % 100 mL IVPB  Status:  Discontinued     3 g 200 mL/hr over 30 Minutes Intravenous Every 8 hours 07/13/16 0844 07/15/16 0853   07/13/16 0800  azithromycin (ZITHROMAX) 500 mg in dextrose 5 % 250 mL IVPB  Status:  Discontinued     500 mg 250 mL/hr over 60 Minutes Intravenous Every 24 hours 07/13/16 0716 07/13/16 0833   07/13/16 0730  cefTRIAXone (ROCEPHIN) 1 g in dextrose 5 % 50 mL IVPB  Status:  Discontinued     1 g 100 mL/hr over 30 Minutes Intravenous  Once 07/13/16 [id number] 07/13/16 [id number]       Radiology Studies: Ct Angio Head W Or Wo Contrast  Result Date: 07/14/2016 CLINICAL DATA:  Acute left basal ganglia/ deep white matter infarct on MRI. EXAM: CT ANGIOGRAPHY HEAD AND NECK TECHNIQUE: Multidetector CT imaging of the head and neck was performed using the standard protocol during bolus administration of intravenous contrast. Multiplanar CT  image reconstructions and MIPs were obtained to evaluate the vascular anatomy. Carotid stenosis measurements (when applicable) are obtained utilizing NASCET criteria, using the distal internal carotid diameter as the denominator. CONTRAST:  50 mL Isovue 370 COMPARISON:  Head MRI and CT 07/13/2016.  Head MRA 10/16/2013. FINDINGS: CT HEAD FINDINGS Brain: Subcentimeter hypodensity in the left corona radiata corresponds to the acute lacunar infarct on MRI. The multiple chronic lacunar infarcts are again seen involving the basal ganglia and thalami bilaterally. There is moderate cerebral atrophy. Cerebral white matter hypodensities are compatible with moderate chronic small vessel ischemic disease as seen on MRI. There is no evidence of acute intracranial hemorrhage, mass, midline shift, or extra-axial fluid collection. Vascular: No hyperdense vessel.  Calcified atherosclerosis. Skull: No fracture focal osseous lesion. Sinuses: Bilateral maxillary sinus mucous retention cysts, right larger than left. Clear mastoid air cells. Cerumen in the external auditory canals. Orbits: Bilateral cataract extraction. Review of the MIP images confirms the above findings CTA NECK FINDINGS Aortic arch: Standard 3 vessel aortic arch. Mild calcified and soft plaque in the aortic arch. Mild calcified plaque in the subclavian arteries without stenosis. Right carotid system: Patent without evidence of dissection or significant stenosis. Mild calcified plaque in the distal common carotid artery and about the carotid bifurcation. Left carotid system: Patent without evidence of dissection or significant stenosis. Mild calcified plaque about the carotid bifurcation. Vertebral arteries: Patent and codominant. Calcified plaque at the right vertebral artery origin results in severe stenosis. Degenerative spurring in the cervical spine and results in mild right greater than left vertebral artery stenosis at C6-7 and severe left vertebral artery  stenosis at C3-4. Skeleton: Advanced multilevel disc and facet degeneration in the cervical spine. Severe bilateral neural foraminal stenosis at C3-4. Other neck: Scattered venous gas, likely iatrogenic. Upper chest: Mild biapical scarring. Partially visualized consolidation in the posterior left upper lobe and superior segment of the left lower lobe. Partially visualized small left pleural effusion. Review of the MIP images confirms  the above findings CTA HEAD FINDINGS Anterior circulation: Internal carotid arteries are patent from skullbase to carotid termini. There is relatively diffuse carotid siphon calcification bilaterally but no significant stenosis. ACAs and MCAs are patent with mild branch vessel irregularity but no evidence of major branch occlusion or significant proximal stenosis. There is a small anterior communicating artery. No aneurysm. Posterior circulation: Intracranial vertebral arteries are patent and codominant with mild atherosclerosis but no significant stenosis. Left PICA, bilateral AICAs, and bilateral SCAs are visualized. Basilar artery is widely patent. Posterior communicating arteries are not identified. PCAs are patent without evidence of significant proximal stenosis, however there are distal P2 stenoses which are moderate on the right and mild on the left, stable to mildly increased. No aneurysm. Venous sinuses: Patent. Anatomic variants: None. Delayed phase: No abnormal enhancement. Review of the MIP images confirms the above findings IMPRESSION: 1. No large vessel occlusion. 2. Intracranial atherosclerosis with a moderate distal right P2 stenosis. 3. No significant proximal anterior circulation stenosis. 4. Mild bilateral cervical carotid artery atherosclerosis without stenosis. 5. Severe right vertebral artery origin stenosis. Bilateral V2 segment stenoses (severe on the left) due to degenerative spurring in the cervical spine. 6. Partially visualized left upper and lower lobe  pulmonary consolidation concerning for pneumonia. Small pleural effusion. Electronically Signed   By: Logan Bores M.D.   On: 07/14/2016 09:35   Dg Chest 2 View  Result Date: 07/14/2016 CLINICAL DATA:  Cough. EXAM: CHEST  2 VIEW COMPARISON:  Radiographs of July 13, 2016. FINDINGS: Stable cardiomediastinal silhouette. No pneumothorax is noted. No pleural effusion is noted. Multilevel degenerative disc disease is noted in lower thoracic spine. Minimal right basilar scarring or subsegmental atelectasis is noted. Increased left basilar opacity is noted concerning for worsening infiltrate or pneumonia. IMPRESSION: Increase left basilar opacity is noted concerning for worsening infiltrate or pneumonia. Followup PA and lateral chest X-ray is recommended in 3-4 weeks following trial of antibiotic therapy to ensure resolution and exclude underlying malignancy. Electronically Signed   By: Marijo Conception, M.D.   On: 07/14/2016 08:33   Ct Angio Neck W Or Wo Contrast  Result Date: 07/14/2016 CLINICAL DATA:  Acute left basal ganglia/ deep white matter infarct on MRI. EXAM: CT ANGIOGRAPHY HEAD AND NECK TECHNIQUE: Multidetector CT imaging of the head and neck was performed using the standard protocol during bolus administration of intravenous contrast. Multiplanar CT image reconstructions and MIPs were obtained to evaluate the vascular anatomy. Carotid stenosis measurements (when applicable) are obtained utilizing NASCET criteria, using the distal internal carotid diameter as the denominator. CONTRAST:  50 mL Isovue 370 COMPARISON:  Head MRI and CT 07/13/2016.  Head MRA 10/16/2013. FINDINGS: CT HEAD FINDINGS Brain: Subcentimeter hypodensity in the left corona radiata corresponds to the acute lacunar infarct on MRI. The multiple chronic lacunar infarcts are again seen involving the basal ganglia and thalami bilaterally. There is moderate cerebral atrophy. Cerebral white matter hypodensities are compatible with moderate  chronic small vessel ischemic disease as seen on MRI. There is no evidence of acute intracranial hemorrhage, mass, midline shift, or extra-axial fluid collection. Vascular: No hyperdense vessel.  Calcified atherosclerosis. Skull: No fracture focal osseous lesion. Sinuses: Bilateral maxillary sinus mucous retention cysts, right larger than left. Clear mastoid air cells. Cerumen in the external auditory canals. Orbits: Bilateral cataract extraction. Review of the MIP images confirms the above findings CTA NECK FINDINGS Aortic arch: Standard 3 vessel aortic arch. Mild calcified and soft plaque in the aortic arch. Mild calcified plaque in  the subclavian arteries without stenosis. Right carotid system: Patent without evidence of dissection or significant stenosis. Mild calcified plaque in the distal common carotid artery and about the carotid bifurcation. Left carotid system: Patent without evidence of dissection or significant stenosis. Mild calcified plaque about the carotid bifurcation. Vertebral arteries: Patent and codominant. Calcified plaque at the right vertebral artery origin results in severe stenosis. Degenerative spurring in the cervical spine and results in mild right greater than left vertebral artery stenosis at C6-7 and severe left vertebral artery stenosis at C3-4. Skeleton: Advanced multilevel disc and facet degeneration in the cervical spine. Severe bilateral neural foraminal stenosis at C3-4. Other neck: Scattered venous gas, likely iatrogenic. Upper chest: Mild biapical scarring. Partially visualized consolidation in the posterior left upper lobe and superior segment of the left lower lobe. Partially visualized small left pleural effusion. Review of the MIP images confirms the above findings CTA HEAD FINDINGS Anterior circulation: Internal carotid arteries are patent from skullbase to carotid termini. There is relatively diffuse carotid siphon calcification bilaterally but no significant stenosis.  ACAs and MCAs are patent with mild branch vessel irregularity but no evidence of major branch occlusion or significant proximal stenosis. There is a small anterior communicating artery. No aneurysm. Posterior circulation: Intracranial vertebral arteries are patent and codominant with mild atherosclerosis but no significant stenosis. Left PICA, bilateral AICAs, and bilateral SCAs are visualized. Basilar artery is widely patent. Posterior communicating arteries are not identified. PCAs are patent without evidence of significant proximal stenosis, however there are distal P2 stenoses which are moderate on the right and mild on the left, stable to mildly increased. No aneurysm. Venous sinuses: Patent. Anatomic variants: None. Delayed phase: No abnormal enhancement. Review of the MIP images confirms the above findings IMPRESSION: 1. No large vessel occlusion. 2. Intracranial atherosclerosis with a moderate distal right P2 stenosis. 3. No significant proximal anterior circulation stenosis. 4. Mild bilateral cervical carotid artery atherosclerosis without stenosis. 5. Severe right vertebral artery origin stenosis. Bilateral V2 segment stenoses (severe on the left) due to degenerative spurring in the cervical spine. 6. Partially visualized left upper and lower lobe pulmonary consolidation concerning for pneumonia. Small pleural effusion. Electronically Signed   By: Logan Bores M.D.   On: 07/14/2016 09:35        Scheduled Meds: . amoxicillin-clavulanate  1 tablet Oral BID  . aspirin  300 mg Rectal Daily   Or  . aspirin  325 mg Oral Daily  . atorvastatin  10 mg Oral Daily  . clopidogrel  75 mg Oral Daily  . enoxaparin (LOVENOX) injection  30 mg Subcutaneous Q24H  . insulin aspart  0-9 Units Subcutaneous TID WC   Continuous Infusions:    LOS: 1 day    Time spent: 25 min    [place], DO [facility] Pager [phone]  If 7PM-7AM, please contact night-coverage [email or web address] Password  [email or web address] 07/15/2016, 1:37 PM

## 2016-07-15 NOTE — Progress Notes (Signed)
[employer] has denied approval to admit to inpt rehab today. I met with pt and his wife at bedside and they are aware. I have notified RN CM and SW. We will sign off. [phone]

## 2016-07-16 LAB — GLUCOSE, CAPILLARY: Glucose-Capillary: 96 mg/dL (ref 65–99)

## 2016-07-16 MED ORDER — FAMOTIDINE 20 MG PO TABS: 20.0000 mg | ORAL_TABLET | Freq: Every day | ORAL | Status: DC

## 2016-07-16 MED ORDER — AMOXICILLIN-POT CLAVULANATE 500-125 MG PO TABS: 1.0000 | ORAL_TABLET | Freq: Two times a day (BID) | ORAL | Status: DC

## 2016-07-16 MED ORDER — ASPIRIN 325 MG PO TABS: 325.0000 mg | ORAL_TABLET | Freq: Every day | ORAL | Status: DC

## 2016-07-16 NOTE — Discharge Summary (Signed)
PATIENT DETAILS Name: Clinton [name] Age: 83 y.o. Sex: male Date of Birth: [birth year] MRN: [id number]. Admitting Physician: Waldemar Dickens, MD PCP:Jim Maxwell Caul, MD  Admit Date: 07/13/2016 Discharge date: 07/16/2016  Recommendations for Outpatient Follow-up:  1. Follow up with PCP in 1-2 weeks 2. Please obtain BMP/CBC in one week 3. Please ensure speech therapy follow-up at SNF. 4. Please ensure patient follows up with neurology 5. Aspirin/Plavix for 3 months, then Plavix alone 6. Repeat 2 view chest x-ray in 4-6 weeks to document resolution of infiltrate.   Admitted From:  Home   Disposition: SNF   Home Health: No  Equipment/Devices: None  Discharge Condition: Stable  CODE STATUS: DNR  Diet recommendation:  Heart Healthy / Carb Modified  Diet recommendations: Regular;Thin liquid;Other(comment) (CHIN TUCK WITH LIQUIDS) Liquids provided via: Cup;No straw Medication Administration: Whole meds with puree Supervision: Full supervision/cueing for compensatory strategies Compensations: Chin tuck;Slow rate;Small sips/bites  Brief Summary: See H&P, Labs, Consult and Test reports for all details in brief, patient is a elderly 83 year old male with prior history of CVA, admitted with dysarthria and fall. MRI of the brain showed a small area of acute infarction involving the superior lentiform nucleus in the periventricular white matter. See below for further details was admitted for   Brief Hospital Course: Acute CVA: With resultant dysarthria and mild left upper extremity weakness. MRI of the brain showed a small left lentiform nucleus infarct. This is thought to be secondary to small vessel disease. CT angiogram of the neck do not show any major stenosis, CT angiogram of the head showed severe right vertebral artery stenosis. Transthoracic echocardiogram showed preserved EF without any embolic source. A1c 5.8, LDL 58. Seen by physical therapy and recommendations were for CIR,  however this was denied by patient's insurance, patient is now being discharged to SNF. Neurology recommends dual antiplatelet therapy with Plavix and aspirin for 3 months, followed by Plavix alone. He will be continued on statin and metformin on discharge. Please ensure follow-up with urology upon discharge.  Probable aspiration pneumonia: No further leukocytosis, he remains afebrile. He was treated with IV Unasyn, on discharge she will be transitioned to Augmentin.Repeat 2 view chest x-ray in 4-6 weeks to document resolution of infiltrate.  Dysphagia: Probably causing above and likely secondary to recurrent CVA. He now has dysarthria as well. He was seen by speech therapy, recommendations are for regular diet with thin liquids. Speech therapy discussed with patient's wife, she is aware of aspiration risk. Please ensure speech therapy follow-up at SNF.  Type 2 diabetes: A1c 5.8, continue metformin on discharge.  Procedures/Studies: Echo 2/27>> LVEF 55-60%, moderate LVH, normal wall motion, diastolic   dysfunction, indeterminate LV filling pressure, modeate LAE, mild   RAE, mild TR, mild PI, RVSP 34 mmHg, normal IVC.   Discharge Diagnoses:  Principal Problem:   CVA (cerebral vascular accident) ([place]) Active Problems:   Diabetes mellitus type 2 in nonobese (HCC)   Dysphagia   Dysarthria   Aspiration pneumonia (HCC)   Leukocytosis   HLD (hyperlipidemia)   Ischemic stroke ([place])   Recurrent aspiration bronchitis/pneumonia [facility])   Discharge Instructions:  Activity:  As tolerated with Full fall precautions use walker/cane & assistance as needed   Discharge Instructions    Ambulatory referral to Neurology    Complete by:  As directed    Follow up with NP Cecille Rubin at [facility] in about 2 months. Thanks.   Diet - low sodium heart healthy    Complete by:  As directed    Diet recommendations: Regular;Thin liquid;Other(comment) (CHIN TUCK WITH LIQUIDS) Liquids provided via: Cup;No  straw Medication Administration: Whole meds with puree Supervision: Full supervision/cueing for compensatory strategies Compensations: Chin tuck;Slow rate;Small sips/bites   Diet Carb Modified    Complete by:  As directed    Discharge instructions    Complete by:  As directed    Check CBGs before meals and at bedtime   Increase activity slowly    Complete by:  As directed      Allergies as of 07/16/2016   No Known Allergies     Medication List    TAKE these medications   amoxicillin-clavulanate 500-125 MG tablet Commonly known as:  AUGMENTIN Take 1 tablet (500 mg total) by mouth 2 (two) times daily. For 4 more days from 07/16/16   aspirin 325 MG tablet Take 1 tablet (325 mg total) by mouth daily. For 3 months-after 3 months stop Aspirin, and just continue on Plavix.   atorvastatin 10 MG tablet Commonly known as:  LIPITOR TAKE 1 TABLET BY MOUTH DAILY.   clopidogrel 75 MG tablet Commonly known as:  PLAVIX TAKE 1 TABLET BY MOUTH ONCE A DAY   famotidine 20 MG tablet Commonly known as:  PEPCID Take 1 tablet (20 mg total) by mouth daily.   metFORMIN 500 MG tablet Commonly known as:  GLUCOPHAGE Take 500 mg by mouth daily.      Follow-up Information    Dennie Bible, NP. Schedule an appointment as soon as possible for a visit in 6 week(s).   Specialty:  Family Medicine Contact information: [address] [place] [address] [phone]        Carlena Sax, MD. Schedule an appointment as soon as possible for a visit in 2 week(s).   Specialty:  Internal Medicine Contact information: [id number] [place] [address] [phone]          No Known Allergies  Consultations:   neurology  Other Procedures/Studies: Ct Angio Head W Or Wo Contrast  Result Date: 07/14/2016 CLINICAL DATA:  Acute left basal ganglia/ deep white matter infarct on MRI. EXAM: CT ANGIOGRAPHY HEAD AND NECK TECHNIQUE: Multidetector CT imaging of the  head and neck was performed using the standard protocol during bolus administration of intravenous contrast. Multiplanar CT image reconstructions and MIPs were obtained to evaluate the vascular anatomy. Carotid stenosis measurements (when applicable) are obtained utilizing NASCET criteria, using the distal internal carotid diameter as the denominator. CONTRAST:  50 mL Isovue 370 COMPARISON:  Head MRI and CT 07/13/2016.  Head MRA 10/16/2013. FINDINGS: CT HEAD FINDINGS Brain: Subcentimeter hypodensity in the left corona radiata corresponds to the acute lacunar infarct on MRI. The multiple chronic lacunar infarcts are again seen involving the basal ganglia and thalami bilaterally. There is moderate cerebral atrophy. Cerebral white matter hypodensities are compatible with moderate chronic small vessel ischemic disease as seen on MRI. There is no evidence of acute intracranial hemorrhage, mass, midline shift, or extra-axial fluid collection. Vascular: No hyperdense vessel.  Calcified atherosclerosis. Skull: No fracture focal osseous lesion. Sinuses: Bilateral maxillary sinus mucous retention cysts, right larger than left. Clear mastoid air cells. Cerumen in the external auditory canals. Orbits: Bilateral cataract extraction. Review of the MIP images confirms the above findings CTA NECK FINDINGS Aortic arch: Standard 3 vessel aortic arch. Mild calcified and soft plaque in the aortic arch. Mild calcified plaque in the subclavian arteries without stenosis. Right carotid system: Patent without evidence of  dissection or significant stenosis. Mild calcified plaque in the distal common carotid artery and about the carotid bifurcation. Left carotid system: Patent without evidence of dissection or significant stenosis. Mild calcified plaque about the carotid bifurcation. Vertebral arteries: Patent and codominant. Calcified plaque at the right vertebral artery origin results in severe stenosis. Degenerative spurring in the  cervical spine and results in mild right greater than left vertebral artery stenosis at C6-7 and severe left vertebral artery stenosis at C3-4. Skeleton: Advanced multilevel disc and facet degeneration in the cervical spine. Severe bilateral neural foraminal stenosis at C3-4. Other neck: Scattered venous gas, likely iatrogenic. Upper chest: Mild biapical scarring. Partially visualized consolidation in the posterior left upper lobe and superior segment of the left lower lobe. Partially visualized small left pleural effusion. Review of the MIP images confirms the above findings CTA HEAD FINDINGS Anterior circulation: Internal carotid arteries are patent from skullbase to carotid termini. There is relatively diffuse carotid siphon calcification bilaterally but no significant stenosis. ACAs and MCAs are patent with mild branch vessel irregularity but no evidence of major branch occlusion or significant proximal stenosis. There is a small anterior communicating artery. No aneurysm. Posterior circulation: Intracranial vertebral arteries are patent and codominant with mild atherosclerosis but no significant stenosis. Left PICA, bilateral AICAs, and bilateral SCAs are visualized. Basilar artery is widely patent. Posterior communicating arteries are not identified. PCAs are patent without evidence of significant proximal stenosis, however there are distal P2 stenoses which are moderate on the right and mild on the left, stable to mildly increased. No aneurysm. Venous sinuses: Patent. Anatomic variants: None. Delayed phase: No abnormal enhancement. Review of the MIP images confirms the above findings IMPRESSION: 1. No large vessel occlusion. 2. Intracranial atherosclerosis with a moderate distal right P2 stenosis. 3. No significant proximal anterior circulation stenosis. 4. Mild bilateral cervical carotid artery atherosclerosis without stenosis. 5. Severe right vertebral artery origin stenosis. Bilateral V2 segment stenoses  (severe on the left) due to degenerative spurring in the cervical spine. 6. Partially visualized left upper and lower lobe pulmonary consolidation concerning for pneumonia. Small pleural effusion. Electronically Signed   By: Logan Bores M.D.   On: 07/14/2016 09:35   Dg Chest 2 View  Result Date: 07/14/2016 CLINICAL DATA:  Cough. EXAM: CHEST  2 VIEW COMPARISON:  Radiographs of July 13, 2016. FINDINGS: Stable cardiomediastinal silhouette. No pneumothorax is noted. No pleural effusion is noted. Multilevel degenerative disc disease is noted in lower thoracic spine. Minimal right basilar scarring or subsegmental atelectasis is noted. Increased left basilar opacity is noted concerning for worsening infiltrate or pneumonia. IMPRESSION: Increase left basilar opacity is noted concerning for worsening infiltrate or pneumonia. Followup PA and lateral chest X-ray is recommended in 3-4 weeks following trial of antibiotic therapy to ensure resolution and exclude underlying malignancy. Electronically Signed   By: Marijo Conception, M.D.   On: 07/14/2016 08:33   Dg Chest 2 View  Result Date: 07/13/2016 CLINICAL DATA:  Status post fall. Altered mental status and slurred speech. Initial encounter. EXAM: CHEST  2 VIEW COMPARISON:  Chest radiograph performed 10/15/2013 FINDINGS: The lungs are well-aerated. Mild bibasilar opacities may reflect atelectasis or possibly mild pneumonia. There is no evidence of pleural effusion or pneumothorax. The heart is borderline normal in size. No acute osseous abnormalities are seen. IMPRESSION: Mild bibasilar airspace opacities may reflect atelectasis or possibly mild pneumonia. No displaced rib fracture seen. Electronically Signed   By: Garald Balding M.D.   On: 07/13/2016 03:17  Ct Head Wo Contrast  Result Date: 07/13/2016 CLINICAL DATA:  Status post fall while going to bathroom. Hit head. Slurred speech over the past few weeks. Concern for cervical spine injury. Initial encounter.  EXAM: CT HEAD WITHOUT CONTRAST CT CERVICAL SPINE WITHOUT CONTRAST TECHNIQUE: Multidetector CT imaging of the head and cervical spine was performed following the standard protocol without intravenous contrast. Multiplanar CT image reconstructions of the cervical spine were also generated. COMPARISON:  MRI/MRA of the brain performed 10/16/2013, and CT of the head performed 10/15/2013 FINDINGS: CT HEAD FINDINGS Brain: No evidence of acute infarction, hemorrhage, hydrocephalus, extra-axial collection or mass lesion/mass effect. Prominence of the ventricles and sulci reflects moderate cortical volume loss. Mild cerebellar atrophy is noted. Scattered periventricular and subcortical white matter change likely reflects small vessel ischemic microangiopathy. Chronic lacunar infarcts are seen at the basal ganglia bilaterally, and at the left thalamus. A chronic lacunar infarct is also noted at the right corona radiata. The brainstem and fourth ventricle are within normal limits. The cerebral hemispheres demonstrate grossly normal gray-white differentiation. No mass effect or midline shift is seen. Vascular: No hyperdense vessel or unexpected calcification. Skull: There is no evidence of fracture; visualized osseous structures are unremarkable in appearance. Sinuses/Orbits: The orbits are within normal limits. A mucus retention cyst or polyp is noted at the right maxillary sinus. The remaining paranasal sinuses and mastoid air cells are well-aerated. Other: No significant soft tissue abnormalities are seen. CT CERVICAL SPINE FINDINGS Alignment: Normal. Skull base and vertebrae: No acute fracture. No primary bone lesion or focal pathologic process. Degenerative change is noted about the dens. Soft tissues and spinal canal: No prevertebral fluid or swelling. No visible canal hematoma. Disc levels: Minimal disc space narrowing is noted at multiple levels along the cervical spine, with scattered anterior and posterior disc  osteophyte complexes, and mild underlying facet disease. Upper chest: Mild scarring is noted at the lung apices. The thyroid gland is diminutive and grossly unremarkable in appearance. Scattered calcification is seen at the carotid bifurcations bilaterally. Other: No additional soft tissue abnormalities are seen. IMPRESSION: 1. No evidence of traumatic intracranial injury or fracture. 2. No evidence of fracture or subluxation along the cervical spine. 3. Moderate cortical volume loss and scattered small vessel ischemic microangiopathy. 4. Chronic lacunar infarcts at the basal ganglia bilaterally, at the left thalamus, and at the right corona radiata. 5. Mild degenerative change along the cervical spine. 6. Mucus retention cyst or polyp at the right maxillary sinus. 7. Mild scarring at the lung apices. 8. Scattered calcification at the carotid bifurcations bilaterally. Carotid ultrasound would be helpful for further evaluation, when and as deemed clinically appropriate. Electronically Signed   By: Garald Balding M.D.   On: 07/13/2016 04:09   Ct Angio Neck W Or Wo Contrast  Result Date: 07/14/2016 CLINICAL DATA:  Acute left basal ganglia/ deep white matter infarct on MRI. EXAM: CT ANGIOGRAPHY HEAD AND NECK TECHNIQUE: Multidetector CT imaging of the head and neck was performed using the standard protocol during bolus administration of intravenous contrast. Multiplanar CT image reconstructions and MIPs were obtained to evaluate the vascular anatomy. Carotid stenosis measurements (when applicable) are obtained utilizing NASCET criteria, using the distal internal carotid diameter as the denominator. CONTRAST:  50 mL Isovue 370 COMPARISON:  Head MRI and CT 07/13/2016.  Head MRA 10/16/2013. FINDINGS: CT HEAD FINDINGS Brain: Subcentimeter hypodensity in the left corona radiata corresponds to the acute lacunar infarct on MRI. The multiple chronic lacunar infarcts are again seen involving  the basal ganglia and thalami  bilaterally. There is moderate cerebral atrophy. Cerebral white matter hypodensities are compatible with moderate chronic small vessel ischemic disease as seen on MRI. There is no evidence of acute intracranial hemorrhage, mass, midline shift, or extra-axial fluid collection. Vascular: No hyperdense vessel.  Calcified atherosclerosis. Skull: No fracture focal osseous lesion. Sinuses: Bilateral maxillary sinus mucous retention cysts, right larger than left. Clear mastoid air cells. Cerumen in the external auditory canals. Orbits: Bilateral cataract extraction. Review of the MIP images confirms the above findings CTA NECK FINDINGS Aortic arch: Standard 3 vessel aortic arch. Mild calcified and soft plaque in the aortic arch. Mild calcified plaque in the subclavian arteries without stenosis. Right carotid system: Patent without evidence of dissection or significant stenosis. Mild calcified plaque in the distal common carotid artery and about the carotid bifurcation. Left carotid system: Patent without evidence of dissection or significant stenosis. Mild calcified plaque about the carotid bifurcation. Vertebral arteries: Patent and codominant. Calcified plaque at the right vertebral artery origin results in severe stenosis. Degenerative spurring in the cervical spine and results in mild right greater than left vertebral artery stenosis at C6-7 and severe left vertebral artery stenosis at C3-4. Skeleton: Advanced multilevel disc and facet degeneration in the cervical spine. Severe bilateral neural foraminal stenosis at C3-4. Other neck: Scattered venous gas, likely iatrogenic. Upper chest: Mild biapical scarring. Partially visualized consolidation in the posterior left upper lobe and superior segment of the left lower lobe. Partially visualized small left pleural effusion. Review of the MIP images confirms the above findings CTA HEAD FINDINGS Anterior circulation: Internal carotid arteries are patent from skullbase to  carotid termini. There is relatively diffuse carotid siphon calcification bilaterally but no significant stenosis. ACAs and MCAs are patent with mild branch vessel irregularity but no evidence of major branch occlusion or significant proximal stenosis. There is a small anterior communicating artery. No aneurysm. Posterior circulation: Intracranial vertebral arteries are patent and codominant with mild atherosclerosis but no significant stenosis. Left PICA, bilateral AICAs, and bilateral SCAs are visualized. Basilar artery is widely patent. Posterior communicating arteries are not identified. PCAs are patent without evidence of significant proximal stenosis, however there are distal P2 stenoses which are moderate on the right and mild on the left, stable to mildly increased. No aneurysm. Venous sinuses: Patent. Anatomic variants: None. Delayed phase: No abnormal enhancement. Review of the MIP images confirms the above findings IMPRESSION: 1. No large vessel occlusion. 2. Intracranial atherosclerosis with a moderate distal right P2 stenosis. 3. No significant proximal anterior circulation stenosis. 4. Mild bilateral cervical carotid artery atherosclerosis without stenosis. 5. Severe right vertebral artery origin stenosis. Bilateral V2 segment stenoses (severe on the left) due to degenerative spurring in the cervical spine. 6. Partially visualized left upper and lower lobe pulmonary consolidation concerning for pneumonia. Small pleural effusion. Electronically Signed   By: Logan Bores M.D.   On: 07/14/2016 09:35   Ct Cervical Spine Wo Contrast  Result Date: 07/13/2016 CLINICAL DATA:  Status post fall while going to bathroom. Hit head. Slurred speech over the past few weeks. Concern for cervical spine injury. Initial encounter. EXAM: CT HEAD WITHOUT CONTRAST CT CERVICAL SPINE WITHOUT CONTRAST TECHNIQUE: Multidetector CT imaging of the head and cervical spine was performed following the standard protocol without  intravenous contrast. Multiplanar CT image reconstructions of the cervical spine were also generated. COMPARISON:  MRI/MRA of the brain performed 10/16/2013, and CT of the head performed 10/15/2013 FINDINGS: CT HEAD FINDINGS Brain: No evidence of  acute infarction, hemorrhage, hydrocephalus, extra-axial collection or mass lesion/mass effect. Prominence of the ventricles and sulci reflects moderate cortical volume loss. Mild cerebellar atrophy is noted. Scattered periventricular and subcortical white matter change likely reflects small vessel ischemic microangiopathy. Chronic lacunar infarcts are seen at the basal ganglia bilaterally, and at the left thalamus. A chronic lacunar infarct is also noted at the right corona radiata. The brainstem and fourth ventricle are within normal limits. The cerebral hemispheres demonstrate grossly normal gray-white differentiation. No mass effect or midline shift is seen. Vascular: No hyperdense vessel or unexpected calcification. Skull: There is no evidence of fracture; visualized osseous structures are unremarkable in appearance. Sinuses/Orbits: The orbits are within normal limits. A mucus retention cyst or polyp is noted at the right maxillary sinus. The remaining paranasal sinuses and mastoid air cells are well-aerated. Other: No significant soft tissue abnormalities are seen. CT CERVICAL SPINE FINDINGS Alignment: Normal. Skull base and vertebrae: No acute fracture. No primary bone lesion or focal pathologic process. Degenerative change is noted about the dens. Soft tissues and spinal canal: No prevertebral fluid or swelling. No visible canal hematoma. Disc levels: Minimal disc space narrowing is noted at multiple levels along the cervical spine, with scattered anterior and posterior disc osteophyte complexes, and mild underlying facet disease. Upper chest: Mild scarring is noted at the lung apices. The thyroid gland is diminutive and grossly unremarkable in appearance. Scattered  calcification is seen at the carotid bifurcations bilaterally. Other: No additional soft tissue abnormalities are seen. IMPRESSION: 1. No evidence of traumatic intracranial injury or fracture. 2. No evidence of fracture or subluxation along the cervical spine. 3. Moderate cortical volume loss and scattered small vessel ischemic microangiopathy. 4. Chronic lacunar infarcts at the basal ganglia bilaterally, at the left thalamus, and at the right corona radiata. 5. Mild degenerative change along the cervical spine. 6. Mucus retention cyst or polyp at the right maxillary sinus. 7. Mild scarring at the lung apices. 8. Scattered calcification at the carotid bifurcations bilaterally. Carotid ultrasound would be helpful for further evaluation, when and as deemed clinically appropriate. Electronically Signed   By: Garald Balding M.D.   On: 07/13/2016 04:09   Mr Brain Wo Contrast  Result Date: 07/13/2016 CLINICAL DATA:  Status post fall prior to admission. Dizziness. Slurred speech for weeks. Stroke risk factors include diabetes, hypertension, and previous stroke. EXAM: MRI HEAD WITHOUT CONTRAST TECHNIQUE: Multiplanar, multiecho pulse sequences of the brain and surrounding structures were obtained without intravenous contrast. COMPARISON:  CT head 07/13/2016.  MR head 10/16/2013. FINDINGS: Brain: Subcentimeter area of acute infarction involves the LEFT lentiform nucleus superiorly, extending to the periventricular white matter. No acute hemorrhage, mass lesion, or extra-axial fluid. Advanced atrophy. Hydrocephalus ex vacuo. Extensive chronic microvascular ischemic change. Prominent perivascular spaces reflective of hypertension. Widespread areas of chronic lacunar infarction involving the brainstem, deep nuclei, and periventricular white matter, similar to priors. Numerous foci of susceptibility reflecting chronic hemorrhage related to ischemia in the posterior limb internal capsule, RIGHT thalamus, RIGHT hippocampus, and  LEFT dentate nucleus all likely sequelae of hypertensive cerebrovascular disease. They appear more numerous/larger on today's exam compared with priors due to today's study performed on 3T scanner. Vascular: Normal flow voids. Skull and upper cervical spine: Normal marrow signal. Extensive pannus projects dorsally from the C1-C2 articulation, narrowing the cervicomedullary junction. Upper cervical cord compression is not established. Sinuses/Orbits: No acute findings. BILATERAL cataract extraction. RIGHT maxillary sinus retention cyst. Other: None. Compared with recent CT, the acute infarct is difficult to  visualize. Chronic ischemic changes are similar to 2015. IMPRESSION: Subcentimeter area of acute infarction involving the superior lentiform nucleus and periventricular white matter, nonhemorrhagic. Atrophy and small vessel disease, multiple chronic lacunar infarcts, widespread microbleeds, similar to 2015. Electronically Signed   By: Staci Righter M.D.   On: 07/13/2016 07:01     TODAY-DAY OF DISCHARGE:  Subjective:   Kasiem Eshelman today has no headache,no chest abdominal pain,no new weakness tingling or numbness.He continues to have dysarthria  Objective:   Blood pressure 138/70, pulse 60, temperature 98.1 F (36.7 C), temperature source Oral, resp. rate 17, weight 50.8 kg (112 lb), SpO2 98 %.  Intake/Output Summary (Last 24 hours) at 07/16/16 0923 Last data filed at 07/16/16 0647  Gross per 24 hour  Intake              680 ml  Output              950 ml  Net             -270 ml   Filed Weights   07/14/16 0532  Weight: 50.8 kg (112 lb)    Exam: Awake Alert, Oriented *3, No new F.N deficits, [place].AT,PERRAL Supple Neck,No JVD, No cervical lymphadenopathy appriciated.  Symmetrical Chest wall movement, Good air movement bilaterally, CTAB RRR,No Gallops,Rubs or new Murmurs, No Parasternal Heave +ve B.Sounds, Abd Soft, Non tender, No organomegaly appriciated, No rebound -guarding or  rigidity. No Cyanosis, Clubbing or edema, No new Rash or bruise   PERTINENT RADIOLOGIC STUDIES: Ct Angio Head W Or Wo Contrast  Result Date: 07/14/2016 CLINICAL DATA:  Acute left basal ganglia/ deep white matter infarct on MRI. EXAM: CT ANGIOGRAPHY HEAD AND NECK TECHNIQUE: Multidetector CT imaging of the head and neck was performed using the standard protocol during bolus administration of intravenous contrast. Multiplanar CT image reconstructions and MIPs were obtained to evaluate the vascular anatomy. Carotid stenosis measurements (when applicable) are obtained utilizing NASCET criteria, using the distal internal carotid diameter as the denominator. CONTRAST:  50 mL Isovue 370 COMPARISON:  Head MRI and CT 07/13/2016.  Head MRA 10/16/2013. FINDINGS: CT HEAD FINDINGS Brain: Subcentimeter hypodensity in the left corona radiata corresponds to the acute lacunar infarct on MRI. The multiple chronic lacunar infarcts are again seen involving the basal ganglia and thalami bilaterally. There is moderate cerebral atrophy. Cerebral white matter hypodensities are compatible with moderate chronic small vessel ischemic disease as seen on MRI. There is no evidence of acute intracranial hemorrhage, mass, midline shift, or extra-axial fluid collection. Vascular: No hyperdense vessel.  Calcified atherosclerosis. Skull: No fracture focal osseous lesion. Sinuses: Bilateral maxillary sinus mucous retention cysts, right larger than left. Clear mastoid air cells. Cerumen in the external auditory canals. Orbits: Bilateral cataract extraction. Review of the MIP images confirms the above findings CTA NECK FINDINGS Aortic arch: Standard 3 vessel aortic arch. Mild calcified and soft plaque in the aortic arch. Mild calcified plaque in the subclavian arteries without stenosis. Right carotid system: Patent without evidence of dissection or significant stenosis. Mild calcified plaque in the distal common carotid artery and about the  carotid bifurcation. Left carotid system: Patent without evidence of dissection or significant stenosis. Mild calcified plaque about the carotid bifurcation. Vertebral arteries: Patent and codominant. Calcified plaque at the right vertebral artery origin results in severe stenosis. Degenerative spurring in the cervical spine and results in mild right greater than left vertebral artery stenosis at C6-7 and severe left vertebral artery stenosis at C3-4. Skeleton: Advanced multilevel disc  and facet degeneration in the cervical spine. Severe bilateral neural foraminal stenosis at C3-4. Other neck: Scattered venous gas, likely iatrogenic. Upper chest: Mild biapical scarring. Partially visualized consolidation in the posterior left upper lobe and superior segment of the left lower lobe. Partially visualized small left pleural effusion. Review of the MIP images confirms the above findings CTA HEAD FINDINGS Anterior circulation: Internal carotid arteries are patent from skullbase to carotid termini. There is relatively diffuse carotid siphon calcification bilaterally but no significant stenosis. ACAs and MCAs are patent with mild branch vessel irregularity but no evidence of major branch occlusion or significant proximal stenosis. There is a small anterior communicating artery. No aneurysm. Posterior circulation: Intracranial vertebral arteries are patent and codominant with mild atherosclerosis but no significant stenosis. Left PICA, bilateral AICAs, and bilateral SCAs are visualized. Basilar artery is widely patent. Posterior communicating arteries are not identified. PCAs are patent without evidence of significant proximal stenosis, however there are distal P2 stenoses which are moderate on the right and mild on the left, stable to mildly increased. No aneurysm. Venous sinuses: Patent. Anatomic variants: None. Delayed phase: No abnormal enhancement. Review of the MIP images confirms the above findings IMPRESSION: 1. No  large vessel occlusion. 2. Intracranial atherosclerosis with a moderate distal right P2 stenosis. 3. No significant proximal anterior circulation stenosis. 4. Mild bilateral cervical carotid artery atherosclerosis without stenosis. 5. Severe right vertebral artery origin stenosis. Bilateral V2 segment stenoses (severe on the left) due to degenerative spurring in the cervical spine. 6. Partially visualized left upper and lower lobe pulmonary consolidation concerning for pneumonia. Small pleural effusion. Electronically Signed   By: Logan Bores M.D.   On: 07/14/2016 09:35   Dg Chest 2 View  Result Date: 07/14/2016 CLINICAL DATA:  Cough. EXAM: CHEST  2 VIEW COMPARISON:  Radiographs of July 13, 2016. FINDINGS: Stable cardiomediastinal silhouette. No pneumothorax is noted. No pleural effusion is noted. Multilevel degenerative disc disease is noted in lower thoracic spine. Minimal right basilar scarring or subsegmental atelectasis is noted. Increased left basilar opacity is noted concerning for worsening infiltrate or pneumonia. IMPRESSION: Increase left basilar opacity is noted concerning for worsening infiltrate or pneumonia. Followup PA and lateral chest X-ray is recommended in 3-4 weeks following trial of antibiotic therapy to ensure resolution and exclude underlying malignancy. Electronically Signed   By: Marijo Conception, M.D.   On: 07/14/2016 08:33   Dg Chest 2 View  Result Date: 07/13/2016 CLINICAL DATA:  Status post fall. Altered mental status and slurred speech. Initial encounter. EXAM: CHEST  2 VIEW COMPARISON:  Chest radiograph performed 10/15/2013 FINDINGS: The lungs are well-aerated. Mild bibasilar opacities may reflect atelectasis or possibly mild pneumonia. There is no evidence of pleural effusion or pneumothorax. The heart is borderline normal in size. No acute osseous abnormalities are seen. IMPRESSION: Mild bibasilar airspace opacities may reflect atelectasis or possibly mild pneumonia. No  displaced rib fracture seen. Electronically Signed   By: Garald Balding M.D.   On: 07/13/2016 03:17   Ct Head Wo Contrast  Result Date: 07/13/2016 CLINICAL DATA:  Status post fall while going to bathroom. Hit head. Slurred speech over the past few weeks. Concern for cervical spine injury. Initial encounter. EXAM: CT HEAD WITHOUT CONTRAST CT CERVICAL SPINE WITHOUT CONTRAST TECHNIQUE: Multidetector CT imaging of the head and cervical spine was performed following the standard protocol without intravenous contrast. Multiplanar CT image reconstructions of the cervical spine were also generated. COMPARISON:  MRI/MRA of the brain performed 10/16/2013, and CT  of the head performed 10/15/2013 FINDINGS: CT HEAD FINDINGS Brain: No evidence of acute infarction, hemorrhage, hydrocephalus, extra-axial collection or mass lesion/mass effect. Prominence of the ventricles and sulci reflects moderate cortical volume loss. Mild cerebellar atrophy is noted. Scattered periventricular and subcortical white matter change likely reflects small vessel ischemic microangiopathy. Chronic lacunar infarcts are seen at the basal ganglia bilaterally, and at the left thalamus. A chronic lacunar infarct is also noted at the right corona radiata. The brainstem and fourth ventricle are within normal limits. The cerebral hemispheres demonstrate grossly normal gray-white differentiation. No mass effect or midline shift is seen. Vascular: No hyperdense vessel or unexpected calcification. Skull: There is no evidence of fracture; visualized osseous structures are unremarkable in appearance. Sinuses/Orbits: The orbits are within normal limits. A mucus retention cyst or polyp is noted at the right maxillary sinus. The remaining paranasal sinuses and mastoid air cells are well-aerated. Other: No significant soft tissue abnormalities are seen. CT CERVICAL SPINE FINDINGS Alignment: Normal. Skull base and vertebrae: No acute fracture. No primary bone lesion  or focal pathologic process. Degenerative change is noted about the dens. Soft tissues and spinal canal: No prevertebral fluid or swelling. No visible canal hematoma. Disc levels: Minimal disc space narrowing is noted at multiple levels along the cervical spine, with scattered anterior and posterior disc osteophyte complexes, and mild underlying facet disease. Upper chest: Mild scarring is noted at the lung apices. The thyroid gland is diminutive and grossly unremarkable in appearance. Scattered calcification is seen at the carotid bifurcations bilaterally. Other: No additional soft tissue abnormalities are seen. IMPRESSION: 1. No evidence of traumatic intracranial injury or fracture. 2. No evidence of fracture or subluxation along the cervical spine. 3. Moderate cortical volume loss and scattered small vessel ischemic microangiopathy. 4. Chronic lacunar infarcts at the basal ganglia bilaterally, at the left thalamus, and at the right corona radiata. 5. Mild degenerative change along the cervical spine. 6. Mucus retention cyst or polyp at the right maxillary sinus. 7. Mild scarring at the lung apices. 8. Scattered calcification at the carotid bifurcations bilaterally. Carotid ultrasound would be helpful for further evaluation, when and as deemed clinically appropriate. Electronically Signed   By: Garald Balding M.D.   On: 07/13/2016 04:09   Ct Angio Neck W Or Wo Contrast  Result Date: 07/14/2016 CLINICAL DATA:  Acute left basal ganglia/ deep white matter infarct on MRI. EXAM: CT ANGIOGRAPHY HEAD AND NECK TECHNIQUE: Multidetector CT imaging of the head and neck was performed using the standard protocol during bolus administration of intravenous contrast. Multiplanar CT image reconstructions and MIPs were obtained to evaluate the vascular anatomy. Carotid stenosis measurements (when applicable) are obtained utilizing NASCET criteria, using the distal internal carotid diameter as the denominator. CONTRAST:  50 mL  Isovue 370 COMPARISON:  Head MRI and CT 07/13/2016.  Head MRA 10/16/2013. FINDINGS: CT HEAD FINDINGS Brain: Subcentimeter hypodensity in the left corona radiata corresponds to the acute lacunar infarct on MRI. The multiple chronic lacunar infarcts are again seen involving the basal ganglia and thalami bilaterally. There is moderate cerebral atrophy. Cerebral white matter hypodensities are compatible with moderate chronic small vessel ischemic disease as seen on MRI. There is no evidence of acute intracranial hemorrhage, mass, midline shift, or extra-axial fluid collection. Vascular: No hyperdense vessel.  Calcified atherosclerosis. Skull: No fracture focal osseous lesion. Sinuses: Bilateral maxillary sinus mucous retention cysts, right larger than left. Clear mastoid air cells. Cerumen in the external auditory canals. Orbits: Bilateral cataract extraction. Review of the  MIP images confirms the above findings CTA NECK FINDINGS Aortic arch: Standard 3 vessel aortic arch. Mild calcified and soft plaque in the aortic arch. Mild calcified plaque in the subclavian arteries without stenosis. Right carotid system: Patent without evidence of dissection or significant stenosis. Mild calcified plaque in the distal common carotid artery and about the carotid bifurcation. Left carotid system: Patent without evidence of dissection or significant stenosis. Mild calcified plaque about the carotid bifurcation. Vertebral arteries: Patent and codominant. Calcified plaque at the right vertebral artery origin results in severe stenosis. Degenerative spurring in the cervical spine and results in mild right greater than left vertebral artery stenosis at C6-7 and severe left vertebral artery stenosis at C3-4. Skeleton: Advanced multilevel disc and facet degeneration in the cervical spine. Severe bilateral neural foraminal stenosis at C3-4. Other neck: Scattered venous gas, likely iatrogenic. Upper chest: Mild biapical scarring. Partially  visualized consolidation in the posterior left upper lobe and superior segment of the left lower lobe. Partially visualized small left pleural effusion. Review of the MIP images confirms the above findings CTA HEAD FINDINGS Anterior circulation: Internal carotid arteries are patent from skullbase to carotid termini. There is relatively diffuse carotid siphon calcification bilaterally but no significant stenosis. ACAs and MCAs are patent with mild branch vessel irregularity but no evidence of major branch occlusion or significant proximal stenosis. There is a small anterior communicating artery. No aneurysm. Posterior circulation: Intracranial vertebral arteries are patent and codominant with mild atherosclerosis but no significant stenosis. Left PICA, bilateral AICAs, and bilateral SCAs are visualized. Basilar artery is widely patent. Posterior communicating arteries are not identified. PCAs are patent without evidence of significant proximal stenosis, however there are distal P2 stenoses which are moderate on the right and mild on the left, stable to mildly increased. No aneurysm. Venous sinuses: Patent. Anatomic variants: None. Delayed phase: No abnormal enhancement. Review of the MIP images confirms the above findings IMPRESSION: 1. No large vessel occlusion. 2. Intracranial atherosclerosis with a moderate distal right P2 stenosis. 3. No significant proximal anterior circulation stenosis. 4. Mild bilateral cervical carotid artery atherosclerosis without stenosis. 5. Severe right vertebral artery origin stenosis. Bilateral V2 segment stenoses (severe on the left) due to degenerative spurring in the cervical spine. 6. Partially visualized left upper and lower lobe pulmonary consolidation concerning for pneumonia. Small pleural effusion. Electronically Signed   By: Logan Bores M.D.   On: 07/14/2016 09:35   Ct Cervical Spine Wo Contrast  Result Date: 07/13/2016 CLINICAL DATA:  Status post fall while going to  bathroom. Hit head. Slurred speech over the past few weeks. Concern for cervical spine injury. Initial encounter. EXAM: CT HEAD WITHOUT CONTRAST CT CERVICAL SPINE WITHOUT CONTRAST TECHNIQUE: Multidetector CT imaging of the head and cervical spine was performed following the standard protocol without intravenous contrast. Multiplanar CT image reconstructions of the cervical spine were also generated. COMPARISON:  MRI/MRA of the brain performed 10/16/2013, and CT of the head performed 10/15/2013 FINDINGS: CT HEAD FINDINGS Brain: No evidence of acute infarction, hemorrhage, hydrocephalus, extra-axial collection or mass lesion/mass effect. Prominence of the ventricles and sulci reflects moderate cortical volume loss. Mild cerebellar atrophy is noted. Scattered periventricular and subcortical white matter change likely reflects small vessel ischemic microangiopathy. Chronic lacunar infarcts are seen at the basal ganglia bilaterally, and at the left thalamus. A chronic lacunar infarct is also noted at the right corona radiata. The brainstem and fourth ventricle are within normal limits. The cerebral hemispheres demonstrate grossly normal gray-white differentiation. No mass  effect or midline shift is seen. Vascular: No hyperdense vessel or unexpected calcification. Skull: There is no evidence of fracture; visualized osseous structures are unremarkable in appearance. Sinuses/Orbits: The orbits are within normal limits. A mucus retention cyst or polyp is noted at the right maxillary sinus. The remaining paranasal sinuses and mastoid air cells are well-aerated. Other: No significant soft tissue abnormalities are seen. CT CERVICAL SPINE FINDINGS Alignment: Normal. Skull base and vertebrae: No acute fracture. No primary bone lesion or focal pathologic process. Degenerative change is noted about the dens. Soft tissues and spinal canal: No prevertebral fluid or swelling. No visible canal hematoma. Disc levels: Minimal disc space  narrowing is noted at multiple levels along the cervical spine, with scattered anterior and posterior disc osteophyte complexes, and mild underlying facet disease. Upper chest: Mild scarring is noted at the lung apices. The thyroid gland is diminutive and grossly unremarkable in appearance. Scattered calcification is seen at the carotid bifurcations bilaterally. Other: No additional soft tissue abnormalities are seen. IMPRESSION: 1. No evidence of traumatic intracranial injury or fracture. 2. No evidence of fracture or subluxation along the cervical spine. 3. Moderate cortical volume loss and scattered small vessel ischemic microangiopathy. 4. Chronic lacunar infarcts at the basal ganglia bilaterally, at the left thalamus, and at the right corona radiata. 5. Mild degenerative change along the cervical spine. 6. Mucus retention cyst or polyp at the right maxillary sinus. 7. Mild scarring at the lung apices. 8. Scattered calcification at the carotid bifurcations bilaterally. Carotid ultrasound would be helpful for further evaluation, when and as deemed clinically appropriate. Electronically Signed   By: Garald Balding M.D.   On: 07/13/2016 04:09   Mr Brain Wo Contrast  Result Date: 07/13/2016 CLINICAL DATA:  Status post fall prior to admission. Dizziness. Slurred speech for weeks. Stroke risk factors include diabetes, hypertension, and previous stroke. EXAM: MRI HEAD WITHOUT CONTRAST TECHNIQUE: Multiplanar, multiecho pulse sequences of the brain and surrounding structures were obtained without intravenous contrast. COMPARISON:  CT head 07/13/2016.  MR head 10/16/2013. FINDINGS: Brain: Subcentimeter area of acute infarction involves the LEFT lentiform nucleus superiorly, extending to the periventricular white matter. No acute hemorrhage, mass lesion, or extra-axial fluid. Advanced atrophy. Hydrocephalus ex vacuo. Extensive chronic microvascular ischemic change. Prominent perivascular spaces reflective of  hypertension. Widespread areas of chronic lacunar infarction involving the brainstem, deep nuclei, and periventricular white matter, similar to priors. 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 They appear more numerous/larger on today's exam compared with priors due to today's study performed on 3T scanner. Vascular: Normal flow voids. Skull and upper cervical spine: Normal marrow signal. Extensive pannus projects dorsally from the C1-C2 articulation, narrowing the cervicomedullary junction. Upper cervical cord compression is not established. Sinuses/Orbits: No acute findings. BILATERAL cataract extraction. RIGHT maxillary sinus retention cyst. Other: None. Compared with recent CT, the acute infarct is difficult to visualize. Chronic ischemic changes are similar to 2015. IMPRESSION: Subcentimeter area of acute infarction involving the superior lentiform nucleus and periventricular white matter, nonhemorrhagic. Atrophy and small vessel disease, multiple chronic lacunar infarcts, widespread microbleeds, similar to 2015. Electronically Signed   By: Staci Righter M.D.   On: 07/13/2016 07:01     PERTINENT LAB RESULTS: CBC:  Recent Labs  07/15/16 0501  WBC 9.9  HGB 11.0*  HCT 33.5*  PLT 173   CMET CMP  Component Value Date/Time   NA 136 07/15/2016 0501   K 3.7 07/15/2016 0501   CL 106 07/15/2016 0501   CO2 25 07/15/2016 0501   GLUCOSE 92 07/15/2016 0501   BUN 14 07/15/2016 0501   CREATININE 0.89 07/15/2016 0501   CALCIUM 8.6 (L) 07/15/2016 0501   PROT 6.4 (L) 07/13/2016 0353   ALBUMIN 3.3 (L) 07/13/2016 0353   AST 21 07/13/2016 0353   ALT 12 (L) 07/13/2016 0353   ALKPHOS 62 07/13/2016 0353   BILITOT 0.5 07/13/2016 0353   GFRNONAA >60 07/15/2016 0501   GFRAA >60 07/15/2016 0501     GFR CrCl cannot be calculated (Unknown ideal weight.). No results for input(s): LIPASE, AMYLASE in the last 72 hours. No results for input(s): CKTOTAL, CKMB, CKMBINDEX, TROPONINI in the last 72 hours. Invalid input(s): POCBNP No results for input(s): DDIMER in the last 72 hours.  Recent Labs  07/14/16 0507  HGBA1C 5.8*    Recent Labs  07/14/16 0507  CHOL 121  HDL 51  LDLCALC 58  TRIG 59  CHOLHDL 2.4   No results for input(s): TSH, T4TOTAL, T3FREE, THYROIDAB in the last 72 hours.  Invalid input(s): FREET3 No results for input(s): VITAMINB12, FOLATE, FERRITIN, TIBC, IRON, RETICCTPCT in the last 72 hours. Coags: No results for input(s): INR in the last 72 hours.  Invalid input(s): PT Microbiology: Recent Results (from the past 240 hour(s))  Culture, blood (routine x 2) Call MD if unable to obtain prior to antibiotics being given     Status: None (Preliminary result)   Collection Time: 07/13/16  7:30 AM  Result Value Ref Range Status   Specimen Description BLOOD RIGHT WRIST  Final   Special Requests BOTTLES DRAWN AEROBIC AND ANAEROBIC  5CC  Final   Culture NO GROWTH 2 DAYS  Final   Report Status PENDING  Incomplete  Culture, blood (routine x 2) Call MD if unable to obtain prior to antibiotics being given     Status: None (Preliminary result)   Collection Time: 07/13/16  7:38 AM  Result Value Ref Range Status   Specimen Description BLOOD LEFT HAND  Final   Special Requests BOTTLES DRAWN AEROBIC ONLY  10CC  Final   Culture NO GROWTH 2 DAYS  Final   Report Status PENDING  Incomplete    FURTHER DISCHARGE INSTRUCTIONS:  Get Medicines reviewed and adjusted: Please take all your medications with you for your next visit with your Primary MD  Laboratory/radiological data: Please request your Primary MD to go over all hospital tests and procedure/radiological results at the follow up, please ask your Primary MD to get all Hospital records sent to his/her office.  In  some cases, they will be blood work, cultures and biopsy results pending at the time of your discharge. Please request that your primary care M.D. goes through all the records of your hospital data and follows up on these results.  Also Note the following: If you experience worsening of your admission symptoms, develop shortness of breath, life threatening emergency, suicidal or homicidal thoughts you must seek medical attention immediately by calling 911 or calling your MD immediately  if symptoms less severe.  You must read complete instructions/literature along with all the possible adverse reactions/side effects for all the Medicines you take and that have been prescribed to you. Take any new Medicines after you have completely understood and accpet all the possible adverse reactions/side effects.   Do not drive when taking Pain medications or sleeping medications (Benzodaizepines)  Do not take more than prescribed Pain, Sleep and Anxiety Medications. It is not advisable to combine anxiety,sleep and pain medications without talking with your primary care practitioner  Special Instructions: If you have smoked or chewed Tobacco  in the last 2 yrs please stop smoking, stop any regular Alcohol  and or any Recreational drug use.  Wear Seat belts while driving.  Please note: You were cared for by a hospitalist during your hospital stay. Once you are discharged, your primary care physician will handle any further medical issues. Please note that NO REFILLS for any discharge medications will be authorized once you are discharged, as it is imperative that you return to your primary care physician (or establish a relationship with a primary care physician if you do not have one) for your post hospital discharge needs so that they can reassess your need for medications and monitor your lab values.  Total Time spent coordinating discharge including counseling, education and face to face time equals 45  minutes.  SignedOren Binet 07/16/2016 9:23 AM

## 2016-07-16 NOTE — Clinical Social Work Placement (Signed)
   CLINICAL SOCIAL WORK PLACEMENT  NOTE  Date:  07/16/2016  Patient Details  Name: Clinton [name] MRN: [id number] Date of Birth: 1924/03/10  Clinical Social Work is seeking post-discharge placement for this patient at the [place] level of care (*CSW will initial, date and re-position this form in  chart as items are completed):  Yes   Patient/family provided with [facility] Work Department's list of facilities offering this level of care within the geographic area requested by the patient (or if unable, by the patient's family).  Yes   Patient/family informed of their freedom to choose among providers that offer the needed level of care, that participate in Medicare, Medicaid or managed care program needed by the patient, have an available bed and are willing to accept the patient.  Yes   Patient/family informed of [facility]'s ownership interest in [facility] and [facility], as well as of the fact that they are under no obligation to receive care at these facilities.  PASRR submitted to [employer] on 07/14/16     PASRR number received on 07/14/16     Existing PASRR number confirmed on       FL2 transmitted to all facilities in geographic area requested by pt/family on 07/14/16     FL2 transmitted to all facilities within larger geographic area on       Patient informed that his/her managed care company has contracts with or will negotiate with certain facilities, including the following:        Yes   Patient/family informed of bed offers received.  Patient chooses bed at [facility]     Physician recommends and patient chooses bed at      Patient to be transferred to [facility] on 07/16/16.  Patient to be transferred to facility by [facility]     Patient family notified on 07/16/16 of transfer.  Name of family member notified:  [name]     PHYSICIAN Please sign DNR, Please sign FL2     Additional  Comment:    _______________________________________________ Jorge Ny, LCSW 07/16/2016, 10:32 AM

## 2016-07-16 NOTE — Progress Notes (Signed)
Patient will discharge to [facility] Anticipated discharge date: 3/2 Family notified: pt wife at bedside Transportation by [facility]- scheduled for 11am  CSW signing off.  Jorge Ny, LCSW Clinical Social Worker [phone]

## 2016-07-16 NOTE — Progress Notes (Signed)
  Speech Language Pathology Treatment: Dysphagia  Patient Details Name: Clinton [name] MRN: [id number] DOB: [birth year] Today's Date: 07/16/2016 Time: [id number] SLP Time Calculation (min) (ACUTE ONLY): 9 min  Assessment / Plan / Recommendation Clinical Impression  Clinton [name] was observed with his breakfast and nursing was present (wife not present for update or education). He demonstrated the compensatory strategy that he has been utilizing (chin tuck). Pt exhibited intermittent immediate coughing with thin liquids. SLP provided skilled intervention and instructed pt to keep chin tucked while swallowing liquids-the more he was able to chuck his chin, the more effective the technique was at reducing the quantity of coughing. Noted mildly prolonged mastication with regular solids. Educated pt and explained that he is still at risk for aspiration; pt wants to continue consuming regular diet and thin liquids. Emphasized the importance of using the compensatory strategies (chin tuck with thin liquids, small bites/sips, and slow rate). ST will f/u briefly for treatment to ensure compliance with strategies and continued education.    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 Wife reported that with previous stroke he presented with dysarthria and dysphagia and at one point was on a special diet with thickened liquids but has been on regular diet with thin liquids for "quite some time". The wife has noticed over the past 1 year progressive functional decline including difficulties with thin liquids that have worsened over the past week as well as significant worsening ofdysarthria over the past one week. He has had an unsteady gait and he has fallen at least once. She has noticed a wet sounding cough for at least one week. CXR showed mild bibasilar airspace opacities may reflect  atelectasis or possibly mild pneumonia. MRI of brain showed subcentimeter area of acute infarction involving the superior lentiform nucleus and periventricular white matter, nonhemorrhagic. Atrophy and small vessel disease, multiple chronic lacunar infarcts, widespread microbleeds, similar to 2015.      SLP Plan  Continue with current plan of care       Recommendations  Diet recommendations: Regular;Thin liquid ((CHIN TUCK WITH LIQUIDS)) Liquids provided via: Cup;No straw Medication Administration: Whole meds with puree Supervision: Full supervision/cueing for compensatory strategies Compensations: Chin tuck;Slow rate;Small sips/bites                Oral Care Recommendations: Oral care BID Follow up Recommendations: Skilled Nursing facility SLP Visit Diagnosis: Dysphagia, unspecified (R13.10) Plan: Continue with current plan of care       [place] , [place] 07/16/2016, 10:38 AM

## 2016-07-16 NOTE — Progress Notes (Signed)
Report called to Lanette Hampshire, at [facility]. She verbalized understanding of RN report. Transport placed for [facility] and scheduled at 11 Am. Ivs and telemetry removed, condom cath removed.

## 2016-07-18 LAB — CULTURE, BLOOD (ROUTINE X 2)
CULTURE: NO GROWTH
CULTURE: NO GROWTH

## 2016-07-19 ENCOUNTER — Non-Acute Institutional Stay (SKILLED_NURSING_FACILITY): Payer: Medicare Other | Admitting: Internal Medicine

## 2016-07-19 ENCOUNTER — Encounter: Payer: Self-pay | Admitting: Internal Medicine

## 2016-07-19 DIAGNOSIS — E119 Type 2 diabetes mellitus without complications: Secondary | ICD-10-CM

## 2016-07-19 DIAGNOSIS — E785 Hyperlipidemia, unspecified: Secondary | ICD-10-CM

## 2016-07-19 DIAGNOSIS — D649 Anemia, unspecified: Secondary | ICD-10-CM

## 2016-07-19 DIAGNOSIS — Z8673 Personal history of transient ischemic attack (TIA), and cerebral infarction without residual deficits: Secondary | ICD-10-CM

## 2016-07-19 DIAGNOSIS — K219 Gastro-esophageal reflux disease without esophagitis: Secondary | ICD-10-CM | POA: Diagnosis not present

## 2016-07-19 DIAGNOSIS — J690 Pneumonitis due to inhalation of food and vomit: Secondary | ICD-10-CM | POA: Diagnosis not present

## 2016-07-19 NOTE — Progress Notes (Signed)
Patient ID: Clinton [name], male   DOB: Feb 06, 1924, 83 y.o.   MRN: [id number]    HISTORY AND PHYSICAL   DATE: 07/19/2016  Location:    [facility] Room Number: 127A Place of Service: SNF (31)   Extended Emergency Contact Information Primary Emergency Contact: Morell,Betty Address: [address]          [place] [address] Johnnette Litter of [place] Phone: [phone] Relation: Spouse  Advanced Directive information Does Patient Have a Medical Advance Directive?: No, Would patient like information on creating a medical advance directive?: No - Patient declined  Chief Complaint  Patient presents with  . New Admit To SNF    HPI:  83 yo male seen today as a new admission into SNF following hospital stay for acute ischemic CVA, probable aspiration pneumonia, dysphagia, dysarthria, leukocytosis, DM, hyperlipidemia. He had a MRI brain that revealed small left lentiform nucleus infarct. CTA of head (+) severe right vertebral artery stenosis. TTE showed preserved EF. Neurology followed. CXR showed LLL infiltrate and was tx with IV unasyn -->po Augmentin. A1c 5.8%; LDL 58; albumin 3.3; WBC 15.2K-->9.9K; absolute neutrophils 13.7K; Hgb 11 at d/c. He presents to SNF for short term rehab.   Today he reports difficulty speaking and swallowing. No other concerns. He is a poor historian due to expressive aphasia. Hx obtained from chart. CBG 168 today. He has 1 more day of Augmentin tx.  DM - controlled. A1c 5.8%. He takes metformin. He takes ASA daily  hyperlipidemia - stable on lipitor. LDL 58  Anemia - stable. Hgb 11  GERD - stable on pepcid   Past Medical History:  Diagnosis Date  . Diabetes mellitus without complication ([place])   . Hypertension   . Stroke [facility])     Past Surgical History:  Procedure Laterality Date  . CARDIAC CATHETERIZATION    . EYE SURGERY      Patient Care Team: Marius Ditch, MD as PCP - General (Internal Medicine)  Social History   Social  History  . Marital status: Married    Spouse name: N/A  . Number of children: N/A  . Years of education: N/A   Occupational History  . Not on file.   Social History Main Topics  . Smoking status: Never Smoker  . Smokeless tobacco: Never Used  . Alcohol use No     Comment: 09/03/14 one glass of wine weekly  . Drug use: No  . Sexual activity: Not on file   Other Topics Concern  . Not on file   Social History Narrative  . No narrative on file     reports that he has never smoked. He has never used smokeless tobacco. He reports that he does not drink alcohol or use drugs.  Family History  Problem Relation Age of Onset  . Cancer Mother    Family Status  Relation Status  . Mother Deceased  . Father Deceased    Immunization History  Administered Date(s) Administered  . PPD Test 07/17/2016    No Known Allergies  Medications: Patient's Medications  New Prescriptions   No medications on file  Previous Medications   AMOXICILLIN-CLAVULANATE (AUGMENTIN) 500-125 MG TABLET    Take 1 tablet (500 mg total) by mouth 2 (two) times daily. For 4 more days from 07/16/16   ASPIRIN 325 MG TABLET    Take 1 tablet (325 mg total) by mouth daily. For 3 months-after 3 months stop Aspirin, and just continue on Plavix.   ATORVASTATIN (  LIPITOR) 10 MG TABLET    TAKE 1 TABLET BY MOUTH DAILY.   CLOPIDOGREL (PLAVIX) 75 MG TABLET    TAKE 1 TABLET BY MOUTH ONCE A DAY   FAMOTIDINE (PEPCID) 20 MG TABLET    Take 1 tablet (20 mg total) by mouth daily.   METFORMIN (GLUCOPHAGE) 500 MG TABLET    Take 500 mg by mouth daily.   Modified Medications   No medications on file  Discontinued Medications   No medications on file    Review of Systems  Unable to perform ROS: Other (expressive aphasia)    Vitals:   07/19/16 1034  BP: 118/72  Pulse: 60  Resp: 17  Temp: 97.2 F (36.2 C)  TempSrc: Oral  SpO2: 98%  Weight: 102 lb 1.6 oz (46.3 kg)  Height: '5\' 6"'$  (1.676 m)   Body mass index is 16.48  kg/m.  Physical Exam  Constitutional: He appears well-developed.  Frail appearing in NAD, lying in bed resting but easily awakened  HENT:  Mouth/Throat: Oropharynx is clear and moist.  Eyes: Pupils are equal, round, and reactive to light. No scleral icterus.  Neck: Neck supple. Carotid bruit is not present. No thyromegaly present.  Cardiovascular: Normal rate, regular rhythm and intact distal pulses.  Exam reveals no gallop and no friction rub.   Murmur (1/6 SEM) heard. no distal LE swelling. No calf TTP  Pulmonary/Chest: Effort normal and breath sounds normal. He has no wheezes. He has no rales. He exhibits no tenderness.  Abdominal: Soft. Bowel sounds are normal. He exhibits no distension, no abdominal bruit, no pulsatile midline mass and no mass. There is no hepatomegaly. There is no tenderness. There is no rebound and no guarding.  Musculoskeletal: He exhibits edema.  Lymphadenopathy:    He has no cervical adenopathy.  Neurological: He is alert.  Expressive aphasia/dysarthria; strength intact  Skin: Skin is warm and dry. No rash noted.  Psychiatric: He has a normal mood and affect. His behavior is normal.     Labs reviewed: Admission on 07/13/2016, Discharged on 07/16/2016  Component Date Value Ref Range Status  . WBC 07/13/2016 15.2* 4.0 - 10.5 K/uL Final  . RBC 07/13/2016 4.08* 4.22 - 5.81 MIL/uL Final  . Hemoglobin 07/13/2016 12.3* 13.0 - 17.0 g/dL Final  . HCT 07/13/2016 37.5* 39.0 - 52.0 % Final  . MCV 07/13/2016 91.9  78.0 - 100.0 fL Final  . MCH 07/13/2016 30.1  26.0 - 34.0 pg Final  . MCHC 07/13/2016 32.8  30.0 - 36.0 g/dL Final  . RDW 07/13/2016 12.5  11.5 - 15.5 % Final  . Platelets 07/13/2016 182  150 - 400 K/uL Final  . Neutrophils Relative % 07/13/2016 90  % Final  . Neutro Abs 07/13/2016 13.7* 1.7 - 7.7 K/uL Final  . Lymphocytes Relative 07/13/2016 4  % Final  . Lymphs Abs 07/13/2016 0.6* 0.7 - 4.0 K/uL Final  . Monocytes Relative 07/13/2016 6  % Final  .  Monocytes Absolute 07/13/2016 0.9  0.1 - 1.0 K/uL Final  . Eosinophils Relative 07/13/2016 0  % Final  . Eosinophils Absolute 07/13/2016 0.0  0.0 - 0.7 K/uL Final  . Basophils Relative 07/13/2016 0  % Final  . Basophils Absolute 07/13/2016 0.0  0.0 - 0.1 K/uL Final  . Sodium 07/13/2016 141  135 - 145 mmol/L Final  . Potassium 07/13/2016 4.2  3.5 - 5.1 mmol/L Final  . Chloride 07/13/2016 105  101 - 111 mmol/L Final  . CO2 07/13/2016 28  22 -  32 mmol/L Final  . Glucose, Bld 07/13/2016 167* 65 - 99 mg/dL Final  . BUN 07/13/2016 22* 6 - 20 mg/dL Final  . Creatinine, Ser 07/13/2016 0.88  0.61 - 1.24 mg/dL Final  . Calcium 07/13/2016 9.4  8.9 - 10.3 mg/dL Final  . Total Protein 07/13/2016 6.4* 6.5 - 8.1 g/dL Final  . Albumin 07/13/2016 3.3* 3.5 - 5.0 g/dL Final  . AST 07/13/2016 21  15 - 41 U/L Final  . ALT 07/13/2016 12* 17 - 63 U/L Final  . Alkaline Phosphatase 07/13/2016 62  38 - 126 U/L Final  . Total Bilirubin 07/13/2016 0.5  0.3 - 1.2 mg/dL Final  . GFR calc non Af Amer 07/13/2016 >60  >60 mL/min Final  . GFR calc Af Amer 07/13/2016 >60  >60 mL/min Final   Comment: (NOTE) The eGFR has been calculated using the CKD EPI equation. This calculation has not been validated in all clinical situations. eGFR's persistently <60 mL/min signify possible Chronic Kidney Disease.   . Anion gap 07/13/2016 8  5 - 15 Final  . Troponin I 07/13/2016 0.04* <0.03 ng/mL Final   Comment: CRITICAL RESULT CALLED TO, READ BACK BY AND VERIFIED WITH: D.HANSIN,RN [id number] 07/13/16 G.MCADOO   . Prothrombin Time 07/13/2016 13.6  11.4 - 15.2 seconds Final  . INR 07/13/2016 1.04   Final  . Color, Urine 07/13/2016 YELLOW  YELLOW Final  . APPearance 07/13/2016 CLEAR  CLEAR Final  . Specific Gravity, Urine 07/13/2016 1.021  1.005 - 1.030 Final  . pH 07/13/2016 8.0  5.0 - 8.0 Final  . Glucose, UA 07/13/2016 50* NEGATIVE mg/dL Final  . Hgb urine dipstick 07/13/2016 NEGATIVE  NEGATIVE Final  . Bilirubin Urine 07/13/2016  NEGATIVE  NEGATIVE Final  . Ketones, ur 07/13/2016 NEGATIVE  NEGATIVE mg/dL Final  . Protein, ur 07/13/2016 30* NEGATIVE mg/dL Final  . Nitrite 07/13/2016 NEGATIVE  NEGATIVE Final  . Leukocytes, UA 07/13/2016 NEGATIVE  NEGATIVE Final  . RBC / HPF 07/13/2016 6-30  0 - 5 RBC/hpf Final  . WBC, UA 07/13/2016 0-5  0 - 5 WBC/hpf Final  . Bacteria, UA 07/13/2016 NONE SEEN  NONE SEEN Final  . Squamous Epithelial / LPF 07/13/2016 NONE SEEN  NONE SEEN Final  . BP 07/13/2016 142/61  mmHg Final  . Specimen Description 07/13/2016 BLOOD LEFT HAND   Final  . Special Requests 07/13/2016 BOTTLES DRAWN AEROBIC ONLY  10CC   Final  . Culture 07/13/2016 NO GROWTH 5 DAYS   Final  . Report Status 07/13/2016 07/18/2016 FINAL   Final  . Specimen Description 07/13/2016 BLOOD RIGHT WRIST   Final  . Special Requests 07/13/2016 BOTTLES DRAWN AEROBIC AND ANAEROBIC  5CC   Final  . Culture 07/13/2016 NO GROWTH 5 DAYS   Final  . Report Status 07/13/2016 07/18/2016 FINAL   Final  . HIV Screen 4th Generation wRfx 07/13/2016 Non Reactive  Non Reactive Final   Comment: (NOTE) Performed At: [facility] [place], [place] [id number] Lindon Romp MD WU:[id number]   . Strep Pneumo Urinary Antigen 07/13/2016 NEGATIVE  NEGATIVE Final   Comment:        Infection due to S. pneumoniae cannot be absolutely ruled out since the antigen present may be below the detection limit of the test.   . L. pneumophila Serogp 1 Ur Ag 07/13/2016 Negative  Negative Final   Comment: (NOTE) Presumptive negative for L. pneumophila serogroup 1 antigen in urine, suggesting no recent or current infection. Legionnaires' disease cannot  be ruled out since other serogroups and species may also cause disease. Performed At: [facility] [place], [place] [id number] Lindon Romp MD WI:[id number]   . Source of Sample 07/13/2016 URINE, RANDOM   Final  . Troponin I 07/13/2016 0.04* <0.03 ng/mL Final    . Procalcitonin 07/13/2016 0.10  ng/mL Final   Comment:        Interpretation: PCT (Procalcitonin) <= 0.5 ng/mL: Systemic infection (sepsis) is not likely. Local bacterial infection is possible. (NOTE)         ICU PCT Algorithm               Non ICU PCT Algorithm    ----------------------------     ------------------------------         PCT < 0.25 ng/mL                 PCT < 0.1 ng/mL     Stopping of antibiotics            Stopping of antibiotics       strongly encouraged.               strongly encouraged.    ----------------------------     ------------------------------       PCT level decrease by               PCT < 0.25 ng/mL       >= 80% from peak PCT       OR PCT 0.25 - 0.5 ng/mL          Stopping of antibiotics                                             encouraged.     Stopping of antibiotics           encouraged.    ----------------------------     ------------------------------       PCT level decrease by              PCT >= 0.25 ng/mL       < 80% from peak PCT        AND PCT >= 0.5 ng/mL            Continuin                          g antibiotics                                              encouraged.       Continuing antibiotics            encouraged.    ----------------------------     ------------------------------     PCT level increase compared          PCT > 0.5 ng/mL         with peak PCT AND          PCT >= 0.5 ng/mL             Escalation of antibiotics  strongly encouraged.      Escalation of antibiotics        strongly encouraged.   . Glucose-Capillary 07/13/2016 120* 65 - 99 mg/dL Final  . Glucose-Capillary 07/13/2016 105* 65 - 99 mg/dL Final  . Hgb A1c MFr Bld 07/14/2016 5.8* 4.8 - 5.6 % Final   Comment: (NOTE)         Pre-diabetes: 5.7 - 6.4         Diabetes: >6.4         Glycemic control for adults with diabetes: <7.0   . Mean Plasma Glucose 07/14/2016 120  mg/dL Final   Comment: (NOTE) Performed At: [facility] [place], [place] [id number] Lindon Romp MD GQ:[id number]   . Cholesterol 07/14/2016 121  0 - 200 mg/dL Final  . Triglycerides 07/14/2016 59  <150 mg/dL Final  . HDL 07/14/2016 51  >40 mg/dL Final  . Total CHOL/HDL Ratio 07/14/2016 2.4  RATIO Final  . VLDL 07/14/2016 12  0 - 40 mg/dL Final  . LDL Cholesterol 07/14/2016 58  0 - 99 mg/dL Final   Comment:        Total Cholesterol/HDL:CHD Risk Coronary Heart Disease Risk Table                     Men   Women  1/2 Average Risk   3.4   3.3  Average Risk       5.0   4.4  2 X Average Risk   9.6   7.1  3 X Average Risk  23.4   11.0        Use the calculated Patient Ratio above and the CHD Risk Table to determine the patient's CHD Risk.        ATP III CLASSIFICATION (LDL):  <100     mg/dL   Optimal  100-129  mg/dL   Near or Above                    Optimal  130-159  mg/dL   Borderline  160-189  mg/dL   High  >190     mg/dL   Very High   . Glucose-Capillary 07/13/2016 106* 65 - 99 mg/dL Final  . Glucose-Capillary 07/13/2016 142* 65 - 99 mg/dL Final  . Glucose-Capillary 07/14/2016 66  65 - 99 mg/dL Final  . Glucose-Capillary 07/14/2016 107* 65 - 99 mg/dL Final  . Glucose-Capillary 07/14/2016 140* 65 - 99 mg/dL Final  . Glucose-Capillary 07/14/2016 108* 65 - 99 mg/dL Final  . Glucose-Capillary 07/14/2016 135* 65 - 99 mg/dL Final  . Glucose-Capillary 07/14/2016 114* 65 - 99 mg/dL Final  . Comment 1 07/14/2016 Document in Chart   Final  . Procalcitonin 07/15/2016 <0.10  ng/mL Final   Comment:        Interpretation: PCT (Procalcitonin) <= 0.5 ng/mL: Systemic infection (sepsis) is not likely. Local bacterial infection is possible. (NOTE)         ICU PCT Algorithm               Non ICU PCT Algorithm    ----------------------------     ------------------------------         PCT < 0.25 ng/mL                 PCT < 0.1 ng/mL     Stopping of antibiotics            Stopping of antibiotics  strongly  encouraged.               strongly encouraged.    ----------------------------     ------------------------------       PCT level decrease by               PCT < 0.25 ng/mL       >= 80% from peak PCT       OR PCT 0.25 - 0.5 ng/mL          Stopping of antibiotics                                             encouraged.     Stopping of antibiotics           encouraged.    ----------------------------     ------------------------------       PCT level decrease by              PCT >= 0.25 ng/mL       < 80% from peak PCT        AND PCT >= 0.5 ng/mL            Continuin                          g antibiotics                                              encouraged.       Continuing antibiotics            encouraged.    ----------------------------     ------------------------------     PCT level increase compared          PCT > 0.5 ng/mL         with peak PCT AND          PCT >= 0.5 ng/mL             Escalation of antibiotics                                          strongly encouraged.      Escalation of antibiotics        strongly encouraged.   . Sodium 07/15/2016 136  135 - 145 mmol/L Final  . Potassium 07/15/2016 3.7  3.5 - 5.1 mmol/L Final  . Chloride 07/15/2016 106  101 - 111 mmol/L Final  . CO2 07/15/2016 25  22 - 32 mmol/L Final  . Glucose, Bld 07/15/2016 92  65 - 99 mg/dL Final  . BUN 57/98/8767 14  6 - 20 mg/dL Final  . Creatinine, Ser 07/15/2016 0.89  0.61 - 1.24 mg/dL Final  . Calcium 81/18/3740 8.6* 8.9 - 10.3 mg/dL Final  . GFR calc non Af Amer 07/15/2016 >60  >60 mL/min Final  . GFR calc Af Amer 07/15/2016 >60  >60 mL/min Final   Comment: (NOTE) The eGFR has been calculated using the CKD EPI equation. This calculation has not been validated in all clinical situations. eGFR's persistently <60 mL/min signify possible Chronic Kidney Disease.   Eustaquio Boyden  gap 07/15/2016 5  5 - 15 Final  . WBC 07/15/2016 9.9  4.0 - 10.5 K/uL Final  . RBC 07/15/2016 3.69* 4.22 - 5.81 MIL/uL  Final  . Hemoglobin 07/15/2016 11.0* 13.0 - 17.0 g/dL Final  . HCT 07/15/2016 33.5* 39.0 - 52.0 % Final  . MCV 07/15/2016 90.8  78.0 - 100.0 fL Final  . MCH 07/15/2016 29.8  26.0 - 34.0 pg Final  . MCHC 07/15/2016 32.8  30.0 - 36.0 g/dL Final  . RDW 07/15/2016 12.5  11.5 - 15.5 % Final  . Platelets 07/15/2016 173  150 - 400 K/uL Final  . Glucose-Capillary 07/14/2016 105* 65 - 99 mg/dL Final  . Glucose-Capillary 07/14/2016 125* 65 - 99 mg/dL Final  . Glucose-Capillary 07/15/2016 90  65 - 99 mg/dL Final  . Glucose-Capillary 07/15/2016 134* 65 - 99 mg/dL Final  . Glucose-Capillary 07/15/2016 128* 65 - 99 mg/dL Final  . Glucose-Capillary 07/15/2016 96  65 - 99 mg/dL Final  . Glucose-Capillary 07/16/2016 96  65 - 99 mg/dL Final    Ct Angio Head W Or Wo Contrast  Result Date: 07/14/2016 CLINICAL DATA:  Acute left basal ganglia/ deep white matter infarct on MRI. EXAM: CT ANGIOGRAPHY HEAD AND NECK TECHNIQUE: Multidetector CT imaging of the head and neck was performed using the standard protocol during bolus administration of intravenous contrast. Multiplanar CT image reconstructions and MIPs were obtained to evaluate the vascular anatomy. Carotid stenosis measurements (when applicable) are obtained utilizing NASCET criteria, using the distal internal carotid diameter as the denominator. CONTRAST:  50 mL Isovue 370 COMPARISON:  Head MRI and CT 07/13/2016.  Head MRA 10/16/2013. FINDINGS: CT HEAD FINDINGS Brain: Subcentimeter hypodensity in the left corona radiata corresponds to the acute lacunar infarct on MRI. The multiple chronic lacunar infarcts are again seen involving the basal ganglia and thalami bilaterally. There is moderate cerebral atrophy. Cerebral white matter hypodensities are compatible with moderate chronic small vessel ischemic disease as seen on MRI. There is no evidence of acute intracranial hemorrhage, mass, midline shift, or extra-axial fluid collection. Vascular: No hyperdense vessel.   Calcified atherosclerosis. Skull: No fracture focal osseous lesion. Sinuses: Bilateral maxillary sinus mucous retention cysts, right larger than left. Clear mastoid air cells. Cerumen in the external auditory canals. Orbits: Bilateral cataract extraction. Review of the MIP images confirms the above findings CTA NECK FINDINGS Aortic arch: Standard 3 vessel aortic arch. Mild calcified and soft plaque in the aortic arch. Mild calcified plaque in the subclavian arteries without stenosis. Right carotid system: Patent without evidence of dissection or significant stenosis. Mild calcified plaque in the distal common carotid artery and about the carotid bifurcation. Left carotid system: Patent without evidence of dissection or significant stenosis. Mild calcified plaque about the carotid bifurcation. Vertebral arteries: Patent and codominant. Calcified plaque at the right vertebral artery origin results in severe stenosis. Degenerative spurring in the cervical spine and results in mild right greater than left vertebral artery stenosis at C6-7 and severe left vertebral artery stenosis at C3-4. Skeleton: Advanced multilevel disc and facet degeneration in the cervical spine. Severe bilateral neural foraminal stenosis at C3-4. Other neck: Scattered venous gas, likely iatrogenic. Upper chest: Mild biapical scarring. Partially visualized consolidation in the posterior left upper lobe and superior segment of the left lower lobe. Partially visualized small left pleural effusion. Review of the MIP images confirms the above findings CTA HEAD FINDINGS Anterior circulation: Internal carotid arteries are patent from skullbase to carotid termini. There is relatively diffuse carotid siphon calcification  bilaterally but no significant stenosis. ACAs and MCAs are patent with mild branch vessel irregularity but no evidence of major branch occlusion or significant proximal stenosis. There is a small anterior communicating artery. No aneurysm.  Posterior circulation: Intracranial vertebral arteries are patent and codominant with mild atherosclerosis but no significant stenosis. Left PICA, bilateral AICAs, and bilateral SCAs are visualized. Basilar artery is widely patent. Posterior communicating arteries are not identified. PCAs are patent without evidence of significant proximal stenosis, however there are distal P2 stenoses which are moderate on the right and mild on the left, stable to mildly increased. No aneurysm. Venous sinuses: Patent. Anatomic variants: None. Delayed phase: No abnormal enhancement. Review of the MIP images confirms the above findings IMPRESSION: 1. No large vessel occlusion. 2. Intracranial atherosclerosis with a moderate distal right P2 stenosis. 3. No significant proximal anterior circulation stenosis. 4. Mild bilateral cervical carotid artery atherosclerosis without stenosis. 5. Severe right vertebral artery origin stenosis. Bilateral V2 segment stenoses (severe on the left) due to degenerative spurring in the cervical spine. 6. Partially visualized left upper and lower lobe pulmonary consolidation concerning for pneumonia. Small pleural effusion. Electronically Signed   By: Logan Bores M.D.   On: 07/14/2016 09:35   Dg Chest 2 View  Result Date: 07/14/2016 CLINICAL DATA:  Cough. EXAM: CHEST  2 VIEW COMPARISON:  Radiographs of July 13, 2016. FINDINGS: Stable cardiomediastinal silhouette. No pneumothorax is noted. No pleural effusion is noted. Multilevel degenerative disc disease is noted in lower thoracic spine. Minimal right basilar scarring or subsegmental atelectasis is noted. Increased left basilar opacity is noted concerning for worsening infiltrate or pneumonia. IMPRESSION: Increase left basilar opacity is noted concerning for worsening infiltrate or pneumonia. Followup PA and lateral chest X-ray is recommended in 3-4 weeks following trial of antibiotic therapy to ensure resolution and exclude underlying  malignancy. Electronically Signed   By: Marijo Conception, M.D.   On: 07/14/2016 08:33   Dg Chest 2 View  Result Date: 07/13/2016 CLINICAL DATA:  Status post fall. Altered mental status and slurred speech. Initial encounter. EXAM: CHEST  2 VIEW COMPARISON:  Chest radiograph performed 10/15/2013 FINDINGS: The lungs are well-aerated. Mild bibasilar opacities may reflect atelectasis or possibly mild pneumonia. There is no evidence of pleural effusion or pneumothorax. The heart is borderline normal in size. No acute osseous abnormalities are seen. IMPRESSION: Mild bibasilar airspace opacities may reflect atelectasis or possibly mild pneumonia. No displaced rib fracture seen. Electronically Signed   By: Garald Balding M.D.   On: 07/13/2016 03:17   Ct Head Wo Contrast  Result Date: 07/13/2016 CLINICAL DATA:  Status post fall while going to bathroom. Hit head. Slurred speech over the past few weeks. Concern for cervical spine injury. Initial encounter. EXAM: CT HEAD WITHOUT CONTRAST CT CERVICAL SPINE WITHOUT CONTRAST TECHNIQUE: Multidetector CT imaging of the head and cervical spine was performed following the standard protocol without intravenous contrast. Multiplanar CT image reconstructions of the cervical spine were also generated. COMPARISON:  MRI/MRA of the brain performed 10/16/2013, and CT of the head performed 10/15/2013 FINDINGS: CT HEAD FINDINGS Brain: No evidence of acute infarction, hemorrhage, hydrocephalus, extra-axial collection or mass lesion/mass effect. Prominence of the ventricles and sulci reflects moderate cortical volume loss. Mild cerebellar atrophy is noted. Scattered periventricular and subcortical white matter change likely reflects small vessel ischemic microangiopathy. Chronic lacunar infarcts are seen at the basal ganglia bilaterally, and at the left thalamus. A chronic lacunar infarct is also noted at the right corona radiata. The  brainstem and fourth ventricle are within normal limits.  The cerebral hemispheres demonstrate grossly normal gray-white differentiation. No mass effect or midline shift is seen. Vascular: No hyperdense vessel or unexpected calcification. Skull: There is no evidence of fracture; visualized osseous structures are unremarkable in appearance. Sinuses/Orbits: The orbits are within normal limits. A mucus retention cyst or polyp is noted at the right maxillary sinus. The remaining paranasal sinuses and mastoid air cells are well-aerated. Other: No significant soft tissue abnormalities are seen. CT CERVICAL SPINE FINDINGS Alignment: Normal. Skull base and vertebrae: No acute fracture. No primary bone lesion or focal pathologic process. Degenerative change is noted about the dens. Soft tissues and spinal canal: No prevertebral fluid or swelling. No visible canal hematoma. Disc levels: Minimal disc space narrowing is noted at multiple levels along the cervical spine, with scattered anterior and posterior disc osteophyte complexes, and mild underlying facet disease. Upper chest: Mild scarring is noted at the lung apices. The thyroid gland is diminutive and grossly unremarkable in appearance. Scattered calcification is seen at the carotid bifurcations bilaterally. Other: No additional soft tissue abnormalities are seen. IMPRESSION: 1. No evidence of traumatic intracranial injury or fracture. 2. No evidence of fracture or subluxation along the cervical spine. 3. Moderate cortical volume loss and scattered small vessel ischemic microangiopathy. 4. Chronic lacunar infarcts at the basal ganglia bilaterally, at the left thalamus, and at the right corona radiata. 5. Mild degenerative change along the cervical spine. 6. Mucus retention cyst or polyp at the right maxillary sinus. 7. Mild scarring at the lung apices. 8. Scattered calcification at the carotid bifurcations bilaterally. Carotid ultrasound would be helpful for further evaluation, when and as deemed clinically appropriate.  Electronically Signed   By: Garald Balding M.D.   On: 07/13/2016 04:09   Ct Angio Neck W Or Wo Contrast  Result Date: 07/14/2016 CLINICAL DATA:  Acute left basal ganglia/ deep white matter infarct on MRI. EXAM: CT ANGIOGRAPHY HEAD AND NECK TECHNIQUE: Multidetector CT imaging of the head and neck was performed using the standard protocol during bolus administration of intravenous contrast. Multiplanar CT image reconstructions and MIPs were obtained to evaluate the vascular anatomy. Carotid stenosis measurements (when applicable) are obtained utilizing NASCET criteria, using the distal internal carotid diameter as the denominator. CONTRAST:  50 mL Isovue 370 COMPARISON:  Head MRI and CT 07/13/2016.  Head MRA 10/16/2013. FINDINGS: CT HEAD FINDINGS Brain: Subcentimeter hypodensity in the left corona radiata corresponds to the acute lacunar infarct on MRI. The multiple chronic lacunar infarcts are again seen involving the basal ganglia and thalami bilaterally. There is moderate cerebral atrophy. Cerebral white matter hypodensities are compatible with moderate chronic small vessel ischemic disease as seen on MRI. There is no evidence of acute intracranial hemorrhage, mass, midline shift, or extra-axial fluid collection. Vascular: No hyperdense vessel.  Calcified atherosclerosis. Skull: No fracture focal osseous lesion. Sinuses: Bilateral maxillary sinus mucous retention cysts, right larger than left. Clear mastoid air cells. Cerumen in the external auditory canals. Orbits: Bilateral cataract extraction. Review of the MIP images confirms the above findings CTA NECK FINDINGS Aortic arch: Standard 3 vessel aortic arch. Mild calcified and soft plaque in the aortic arch. Mild calcified plaque in the subclavian arteries without stenosis. Right carotid system: Patent without evidence of dissection or significant stenosis. Mild calcified plaque in the distal common carotid artery and about the carotid bifurcation. Left  carotid system: Patent without evidence of dissection or significant stenosis. Mild calcified plaque about the carotid bifurcation. Vertebral arteries:  Patent and codominant. Calcified plaque at the right vertebral artery origin results in severe stenosis. Degenerative spurring in the cervical spine and results in mild right greater than left vertebral artery stenosis at C6-7 and severe left vertebral artery stenosis at C3-4. Skeleton: Advanced multilevel disc and facet degeneration in the cervical spine. Severe bilateral neural foraminal stenosis at C3-4. Other neck: Scattered venous gas, likely iatrogenic. Upper chest: Mild biapical scarring. Partially visualized consolidation in the posterior left upper lobe and superior segment of the left lower lobe. Partially visualized small left pleural effusion. Review of the MIP images confirms the above findings CTA HEAD FINDINGS Anterior circulation: Internal carotid arteries are patent from skullbase to carotid termini. There is relatively diffuse carotid siphon calcification bilaterally but no significant stenosis. ACAs and MCAs are patent with mild branch vessel irregularity but no evidence of major branch occlusion or significant proximal stenosis. There is a small anterior communicating artery. No aneurysm. Posterior circulation: Intracranial vertebral arteries are patent and codominant with mild atherosclerosis but no significant stenosis. Left PICA, bilateral AICAs, and bilateral SCAs are visualized. Basilar artery is widely patent. Posterior communicating arteries are not identified. PCAs are patent without evidence of significant proximal stenosis, however there are distal P2 stenoses which are moderate on the right and mild on the left, stable to mildly increased. No aneurysm. Venous sinuses: Patent. Anatomic variants: None. Delayed phase: No abnormal enhancement. Review of the MIP images confirms the above findings IMPRESSION: 1. No large vessel occlusion. 2.  Intracranial atherosclerosis with a moderate distal right P2 stenosis. 3. No significant proximal anterior circulation stenosis. 4. Mild bilateral cervical carotid artery atherosclerosis without stenosis. 5. Severe right vertebral artery origin stenosis. Bilateral V2 segment stenoses (severe on the left) due to degenerative spurring in the cervical spine. 6. Partially visualized left upper and lower lobe pulmonary consolidation concerning for pneumonia. Small pleural effusion. Electronically Signed   By: Logan Bores M.D.   On: 07/14/2016 09:35   Ct Cervical Spine Wo Contrast  Result Date: 07/13/2016 CLINICAL DATA:  Status post fall while going to bathroom. Hit head. Slurred speech over the past few weeks. Concern for cervical spine injury. Initial encounter. EXAM: CT HEAD WITHOUT CONTRAST CT CERVICAL SPINE WITHOUT CONTRAST TECHNIQUE: Multidetector CT imaging of the head and cervical spine was performed following the standard protocol without intravenous contrast. Multiplanar CT image reconstructions of the cervical spine were also generated. COMPARISON:  MRI/MRA of the brain performed 10/16/2013, and CT of the head performed 10/15/2013 FINDINGS: CT HEAD FINDINGS Brain: No evidence of acute infarction, hemorrhage, hydrocephalus, extra-axial collection or mass lesion/mass effect. Prominence of the ventricles and sulci reflects moderate cortical volume loss. Mild cerebellar atrophy is noted. Scattered periventricular and subcortical white matter change likely reflects small vessel ischemic microangiopathy. Chronic lacunar infarcts are seen at the basal ganglia bilaterally, and at the left thalamus. A chronic lacunar infarct is also noted at the right corona radiata. The brainstem and fourth ventricle are within normal limits. The cerebral hemispheres demonstrate grossly normal gray-white differentiation. No mass effect or midline shift is seen. Vascular: No hyperdense vessel or unexpected calcification. Skull:  There is no evidence of fracture; visualized osseous structures are unremarkable in appearance. Sinuses/Orbits: The orbits are within normal limits. A mucus retention cyst or polyp is noted at the right maxillary sinus. The remaining paranasal sinuses and mastoid air cells are well-aerated. Other: No significant soft tissue abnormalities are seen. CT CERVICAL SPINE FINDINGS Alignment: Normal. Skull base and vertebrae: No acute fracture. No  primary bone lesion or focal pathologic process. Degenerative change is noted about the dens. Soft tissues and spinal canal: No prevertebral fluid or swelling. No visible canal hematoma. Disc levels: Minimal disc space narrowing is noted at multiple levels along the cervical spine, with scattered anterior and posterior disc osteophyte complexes, and mild underlying facet disease. Upper chest: Mild scarring is noted at the lung apices. The thyroid gland is diminutive and grossly unremarkable in appearance. Scattered calcification is seen at the carotid bifurcations bilaterally. Other: No additional soft tissue abnormalities are seen. IMPRESSION: 1. No evidence of traumatic intracranial injury or fracture. 2. No evidence of fracture or subluxation along the cervical spine. 3. Moderate cortical volume loss and scattered small vessel ischemic microangiopathy. 4. Chronic lacunar infarcts at the basal ganglia bilaterally, at the left thalamus, and at the right corona radiata. 5. Mild degenerative change along the cervical spine. 6. Mucus retention cyst or polyp at the right maxillary sinus. 7. Mild scarring at the lung apices. 8. Scattered calcification at the carotid bifurcations bilaterally. Carotid ultrasound would be helpful for further evaluation, when and as deemed clinically appropriate. Electronically Signed   By: Garald Balding M.D.   On: 07/13/2016 04:09   Mr Brain Wo Contrast  Result Date: 07/13/2016 CLINICAL DATA:  Status post fall prior to admission. Dizziness. Slurred  speech for weeks. Stroke risk factors include diabetes, hypertension, and previous stroke. EXAM: MRI HEAD WITHOUT CONTRAST TECHNIQUE: Multiplanar, multiecho pulse sequences of the brain and surrounding structures were obtained without intravenous contrast. COMPARISON:  CT head 07/13/2016.  MR head 10/16/2013. FINDINGS: Brain: Subcentimeter area of acute infarction involves the LEFT lentiform nucleus superiorly, extending to the periventricular white matter. No acute hemorrhage, mass lesion, or extra-axial fluid. Advanced atrophy. Hydrocephalus ex vacuo. Extensive chronic microvascular ischemic change. Prominent perivascular spaces reflective of hypertension. Widespread areas of chronic lacunar infarction involving the brainstem, deep nuclei, and periventricular white matter, similar to priors. 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 They appear more numerous/larger on today's exam compared with priors due to today's study performed on 3T scanner. Vascular: Normal flow voids. Skull and upper cervical spine: Normal marrow signal. Extensive pannus projects dorsally from the C1-C2 articulation, narrowing the cervicomedullary junction. Upper cervical cord compression is not established. Sinuses/Orbits: No acute findings. BILATERAL cataract extraction. RIGHT maxillary sinus retention cyst. Other: None. Compared with recent CT, the acute infarct is difficult to visualize. Chronic ischemic changes are similar to 2015. IMPRESSION: Subcentimeter area of acute infarction involving the superior lentiform nucleus and periventricular white matter, nonhemorrhagic. Atrophy and small vessel disease, multiple chronic lacunar infarcts, widespread microbleeds, similar to 2015. Electronically Signed   By: Staci Righter M.D.   On:  07/13/2016 07:01     Assessment/Plan   ICD-9-CM ICD-10-CM   1. History of recent stroke V12.54 Z86.73    with dysphagia and expressive aphasia  2. Diabetes mellitus type 2 in nonobese (HCC) 250.00 E11.9   3. Aspiration pneumonia, unspecified aspiration pneumonia type, unspecified laterality, unspecified part of lung ([place]) 507.0 J69.0    LLL - improving clinically  4. Hyperlipidemia, unspecified hyperlipidemia type 272.4 E78.5   5. Anemia, unspecified type 285.9 D64.9   6. Gastroesophageal reflux disease without esophagitis 530.81 K21.9      Check BMP, CBC  f/u with neurology as scheduled  He will need ASA/plavix x 3 mos -->plavix for  lifetime  Repeat CXR in 4 weeks to ensure resolution of PNA  Complete abx  Cont other meds as ordered  PT/OT/ST as ordered  GOAL: short term rehab and d/c home when medically appropriate. Communicated with pt and nursing.  Will follow  Lewis Keats S. Perlie Gold  [facility] [address] [place], [place] [address] [phone] Cell (Monday-Friday 8 AM - 5 PM) [phone] After 5 PM and follow prompts

## 2016-07-23 LAB — BASIC METABOLIC PANEL
BUN: 21 mg/dL (ref 4–21)
Creatinine: 0.9 mg/dL (ref 0.6–1.3)
GLUCOSE: 91 mg/dL
Potassium: 4.5 mmol/L (ref 3.4–5.3)
Sodium: 144 mmol/L (ref 137–147)

## 2016-07-23 LAB — CBC AND DIFFERENTIAL
HCT: 35 % — AB (ref 41–53)
Hemoglobin: 11.6 g/dL — AB (ref 13.5–17.5)
NEUTROS ABS: 5 /uL
PLATELETS: 251 10*3/uL (ref 150–399)
WBC: 7.7 10*3/mL

## 2016-08-18 ENCOUNTER — Non-Acute Institutional Stay (SKILLED_NURSING_FACILITY): Payer: Medicare Other | Admitting: Adult Health

## 2016-08-18 ENCOUNTER — Encounter: Payer: Self-pay | Admitting: Adult Health

## 2016-08-18 DIAGNOSIS — E119 Type 2 diabetes mellitus without complications: Secondary | ICD-10-CM | POA: Diagnosis not present

## 2016-08-18 DIAGNOSIS — R1312 Dysphagia, oropharyngeal phase: Secondary | ICD-10-CM

## 2016-08-18 DIAGNOSIS — E785 Hyperlipidemia, unspecified: Secondary | ICD-10-CM | POA: Diagnosis not present

## 2016-08-18 DIAGNOSIS — I69320 Aphasia following cerebral infarction: Secondary | ICD-10-CM

## 2016-08-18 DIAGNOSIS — I639 Cerebral infarction, unspecified: Secondary | ICD-10-CM | POA: Diagnosis not present

## 2016-08-18 NOTE — Progress Notes (Signed)
Location:   [facility] Room Number: 127 A Place of Service:   SNF (31)   CODE STATUS: DNR  No Known Allergies  Chief Complaint  Patient presents with  . Medical Management of Chronic Issues    1 month follow up    HPI:  He is a short term resident of this facility being seen for the management of his chronic illnesses. He is unable to fully participate in the hpi or ros. He does get out of bed daily. There are no nursing concerns at this time.    Past Medical History:  Diagnosis Date  . Diabetes mellitus without complication ([place])   . Hypertension   . Stroke [facility])     Past Surgical History:  Procedure Laterality Date  . CARDIAC CATHETERIZATION    . EYE SURGERY      Social History   Social History  . Marital status: Married    Spouse name: N/A  . Number of children: N/A  . Years of education: N/A   Occupational History  . Not on file.   Social History Main Topics  . Smoking status: Never Smoker  . Smokeless tobacco: Never Used  . Alcohol use No     Comment: 09/03/14 one glass of wine weekly  . Drug use: No  . Sexual activity: Not on file   Other Topics Concern  . Not on file   Social History Narrative  . No narrative on file   Family History  Problem Relation Age of Onset  . Cancer Mother       VITAL SIGNS BP 118/72   Pulse 60   Temp (!) 96 F (35.6 C)   Resp 17   Ht 5\' 6"  (1.676 m)   Wt 102 lb 2 oz (46.3 kg)   SpO2 97%   BMI 16.48 kg/m   Patient's Medications  New Prescriptions   No medications on file  Previous Medications   ASPIRIN EC 81 MG TABLET    Take 81 mg by mouth daily.   ATORVASTATIN (LIPITOR) 10 MG TABLET    TAKE 1 TABLET BY MOUTH DAILY.   CLOPIDOGREL (PLAVIX) 75 MG TABLET    TAKE 1 TABLET BY MOUTH ONCE A DAY   FAMOTIDINE (PEPCID) 20 MG TABLET    Take 1 tablet (20 mg total) by mouth daily.   METFORMIN (GLUCOPHAGE) 500 MG TABLET    Take 500 mg by mouth daily.    NUTRITIONAL SUPPLEMENTS (NUTRITIONAL SUPPLEMENT  PO)    Take by mouth. House 2.0 - Med Pass 120 cc by mouth 2 times a day  Modified Medications   No medications on file  Discontinued Medications   AMOXICILLIN-CLAVULANATE (AUGMENTIN) 500-125 MG TABLET    Take 1 tablet (500 mg total) by mouth 2 (two) times daily. For 4 more days from 07/16/16   ASPIRIN 325 MG TABLET    Take 1 tablet (325 mg total) by mouth daily. For 3 months-after 3 months stop Aspirin, and just continue on Plavix.     SIGNIFICANT DIAGNOSTIC EXAMS  07-13-16: TEE: Impressions:  LVEF 55-60%, moderate LVH, normal wall motion, diastolic   dysfunction, indeterminate LV filling pressure, modeate LAE, mild RAE, mild TR, mild PI, RVSP 34 mmHg, normal IVC.   07-13-16: chest x-ray: Mild bibasilar airspace opacities may reflect atelectasis or possibly mild pneumonia. No displaced rib fracture seen.  07-13-16: ct of head and cervical spine: 1. No evidence of traumatic intracranial injury or fracture. 2. No evidence of fracture or  subluxation along the cervical spine. 3. Moderate cortical volume loss and scattered small vessel ischemic microangiopathy. 4. Chronic lacunar infarcts at the basal ganglia bilaterally, at the left thalamus, and at the right corona radiata. 5. Mild degenerative change along the cervical spine. 6. Mucus retention cyst or polyp at the right maxillary sinus. 7. Mild scarring at the lung apices. 8. Scattered calcification at the carotid bifurcations bilaterally. Carotid ultrasound would be helpful for further evaluation, when and as deemed clinically appropriate.   07-13-16: mri brain: Subcentimeter area of acute infarction involving the superior lentiform nucleus and periventricular white matter, nonhemorrhagic. Atrophy and small vessel disease, multiple chronic lacunar infarcts, widespread microbleeds, similar to 2015.  07-14-16: ct angio of head and neck: IMPRESSION: 1. No large vessel occlusion. 2. Intracranial atherosclerosis with a moderate distal right P2  stenosis. 3. No significant proximal anterior circulation stenosis. 4. Mild bilateral cervical carotid artery atherosclerosis without stenosis. 5. Severe right vertebral artery origin stenosis. Bilateral V2 segment stenoses (severe on the left) due to degenerative spurring in the cervical spine. 6. Partially visualized left upper and lower lobe pulmonary consolidation concerning for pneumonia. Small pleural effusion.    LABS REVIEWED:   07-13-16: wbc 15.2; hgb 12.3; hct 37.5; mcv 91.9; plt 182; glucose 167; bun 22; creat 0.88; k+ 4.2; na++ 141; blood culture: no growth 07-14-16: hgb a1c 5.8; chol 121; ldl 58; trig 59; hdl 51 07-15-16: wbc 9.9; hgb 11.0; hct 33.5; mcv 90.8; ptl 173;glucose 92; bun 14; creat 0.89; k+ 3.7; na+ 136    Review of Systems  Unable to perform ROS: Other (aphasia )    Physical Exam  Constitutional: No distress.  Frail   Eyes: Conjunctivae are normal.  Neck: Neck supple. No JVD present. No thyromegaly present.  Cardiovascular: Normal rate, regular rhythm and intact distal pulses.   Murmur heard. Respiratory: Effort normal and breath sounds normal. No respiratory distress. He has no wheezes.  GI: Soft. Bowel sounds are normal. He exhibits no distension. There is no tenderness.  Musculoskeletal: He exhibits no edema.  Able to move all extremities   Lymphadenopathy:    He has no cervical adenopathy.  Neurological: He is alert.  Skin: Skin is warm and dry. He is not diaphoretic.  Psychiatric: He has a normal mood and affect.     ASSESSMENT/ PLAN:  1. CVA: has aphasia and dysarthria  is neurologically stable; will continue asa 81 mg daily and plavix 75 mg daily will continue therapy as directed   2. Dyslipidemia: ldl is 58 will continue lipitor 10 mg daily   3. gerd: will continue pepcid 20 mg daily   4. Diabetes: hgb a1c 5.8; will stop metformin at this time will check cbg three times weekly and will monitor his status.   5. Dysphagia: no signs of  aspiration present will continue current plan of care and will monitor     MD is aware of resident's narcotic use and is in agreement with current plan of care. We will attempt to wean resident as apropriate   Ok Edwards NP [facility]  Contact [phone] Monday through Friday 8am- 5pm  After hours call [phone]

## 2016-08-25 ENCOUNTER — Non-Acute Institutional Stay (SKILLED_NURSING_FACILITY): Payer: Medicare Other | Admitting: Adult Health

## 2016-08-25 ENCOUNTER — Encounter: Payer: Self-pay | Admitting: Adult Health

## 2016-08-25 DIAGNOSIS — I6302 Cerebral infarction due to thrombosis of basilar artery: Secondary | ICD-10-CM

## 2016-08-25 DIAGNOSIS — I69320 Aphasia following cerebral infarction: Secondary | ICD-10-CM

## 2016-08-25 DIAGNOSIS — E119 Type 2 diabetes mellitus without complications: Secondary | ICD-10-CM | POA: Diagnosis not present

## 2016-08-25 DIAGNOSIS — R471 Dysarthria and anarthria: Secondary | ICD-10-CM | POA: Diagnosis not present

## 2016-08-25 NOTE — Progress Notes (Signed)
Location:   [facility] Room Number: 127 A Place of Service:  SNF (31)    CODE STATUS: DNR  No Known Allergies  Chief Complaint  Patient presents with  . Discharge Note    Disharge    HPI:  He is being discharged to home with home health for pt/ot/st/rn. He will need a front wheel walker; 3:1 commode. He and his family has been given information for life alert. He will need his prescriptions written and will need to follow up with his medical provider.  He had been hospitalized for CVA.    Past Medical History:  Diagnosis Date  . Diabetes mellitus without complication ([place])   . Hypertension   . Stroke [facility])     Past Surgical History:  Procedure Laterality Date  . CARDIAC CATHETERIZATION    . EYE SURGERY      Social History   Social History  . Marital status: Married    Spouse name: N/A  . Number of children: N/A  . Years of education: N/A   Occupational History  . Not on file.   Social History Main Topics  . Smoking status: Never Smoker  . Smokeless tobacco: Never Used  . Alcohol use No     Comment: 09/03/14 one glass of wine weekly  . Drug use: No  . Sexual activity: Not on file   Other Topics Concern  . Not on file   Social History Narrative  . No narrative on file   Family History  Problem Relation Age of Onset  . Cancer Mother     VITAL SIGNS BP (!) 150/68   Pulse 66   Temp 98.4 F (36.9 C)   Resp 16   Ht 5\' 6"  (1.676 m)   Wt 102 lb 3.2 oz (46.4 kg)   SpO2 97%   BMI 16.50 kg/m   Patient's Medications  New Prescriptions   No medications on file  Previous Medications   ASPIRIN EC 81 MG TABLET    Take 81 mg by mouth daily.   ATORVASTATIN (LIPITOR) 10 MG TABLET    TAKE 1 TABLET BY MOUTH DAILY.   CLOPIDOGREL (PLAVIX) 75 MG TABLET    TAKE 1 TABLET BY MOUTH ONCE A DAY   FAMOTIDINE (PEPCID) 20 MG TABLET    Take 1 tablet (20 mg total) by mouth daily.   NUTRITIONAL SUPPLEMENTS (NUTRITIONAL SUPPLEMENT PO)    Take by mouth.  House 2.0 - Med Pass 120 cc by mouth 2 times a day  Modified Medications   No medications on file  Discontinued Medications   METFORMIN (GLUCOPHAGE) 500 MG TABLET    Take 500 mg by mouth daily.      SIGNIFICANT DIAGNOSTIC EXAMS  07-13-16: TEE: Impressions:  LVEF 55-60%, moderate LVH, normal wall motion, diastolic   dysfunction, indeterminate LV filling pressure, modeate LAE, mild RAE, mild TR, mild PI, RVSP 34 mmHg, normal IVC.   07-13-16: chest x-ray: Mild bibasilar airspace opacities may reflect atelectasis or possibly mild pneumonia. No displaced rib fracture seen.  07-13-16: ct of head and cervical spine: 1. No evidence of traumatic intracranial injury or fracture. 2. No evidence of fracture or subluxation along the cervical spine. 3. Moderate cortical volume loss and scattered small vessel ischemic microangiopathy. 4. Chronic lacunar infarcts at the basal ganglia bilaterally, at the left thalamus, and at the right corona radiata. 5. Mild degenerative change along the cervical spine. 6. Mucus retention cyst or polyp at the right maxillary sinus. 7. Mild  scarring at the lung apices. 8. Scattered calcification at the carotid bifurcations bilaterally. Carotid ultrasound would be helpful for further evaluation, when and as deemed clinically appropriate.   07-13-16: mri brain: Subcentimeter area of acute infarction involving the superior lentiform nucleus and periventricular white matter, nonhemorrhagic. Atrophy and small vessel disease, multiple chronic lacunar infarcts, widespread microbleeds, similar to 2015.  07-14-16: ct angio of head and neck: IMPRESSION: 1. No large vessel occlusion. 2. Intracranial atherosclerosis with a moderate distal right P2 stenosis. 3. No significant proximal anterior circulation stenosis. 4. Mild bilateral cervical carotid artery atherosclerosis without stenosis. 5. Severe right vertebral artery origin stenosis. Bilateral V2 segment stenoses (severe on the  left) due to degenerative spurring in the cervical spine. 6. Partially visualized left upper and lower lobe pulmonary consolidation concerning for pneumonia. Small pleural effusion.    LABS REVIEWED:   07-13-16: wbc 15.2; hgb 12.3; hct 37.5; mcv 91.9; plt 182; glucose 167; bun 22; creat 0.88; k+ 4.2; na++ 141; blood culture: no growth 07-14-16: hgb a1c 5.8; chol 121; ldl 58; trig 59; hdl 51 07-15-16: wbc 9.9; hgb 11.0; hct 33.5; mcv 90.8; ptl 173;glucose 92; bun 14; creat 0.89; k+ 3.7; na+ 136    Review of Systems  Unable to perform ROS: Other (aphasia )    Physical Exam  Constitutional: No distress.  Frail   Eyes: Conjunctivae are normal.  Neck: Neck supple. No JVD present. No thyromegaly present.  Cardiovascular: Normal rate, regular rhythm and intact distal pulses.   Murmur heard. Respiratory: Effort normal and breath sounds normal. No respiratory distress. He has no wheezes.  GI: Soft. Bowel sounds are normal. He exhibits no distension. There is no tenderness.  Musculoskeletal: He exhibits no edema.  Able to move all extremities   Lymphadenopathy:    He has no cervical adenopathy.  Neurological: He is alert.  Skin: Skin is warm and dry. He is not diaphoretic.  Psychiatric: He has a normal mood and affect.     ASSESSMENT/ PLAN:  Patient is being discharged with the following home health services:  Pt/ot/st/rn: to evaluate and treat as indicated for gait balance strength adl training aphasia and medication management.   Patient is being discharged with the following durable medical equipment:  Front wheel walker in order to allow him to maintain his current level of independence with his adls. He will need a 3:1 commode   Patient has been advised to f/u with their PCP in 1-2 weeks to bring them up to date on their rehab stay.  Social services at facility was responsible for arranging this appointment.  Pt was provided with a 30 day supply of prescriptions for medications and  refills must be obtained from their PCP.  For controlled substances, a more limited supply may be provided adequate until PCP appointment only.   Time spent with patient 45   minutes >50% time spent counseling; reviewing medical record; tests; labs; and developing future plan of care     Ok Edwards NP [facility]  Contact [phone] Monday through Friday 8am- 5pm  After hours call [phone]

## 2016-09-17 ENCOUNTER — Ambulatory Visit (INDEPENDENT_AMBULATORY_CARE_PROVIDER_SITE_OTHER): Payer: Medicare Other | Admitting: Nurse Practitioner

## 2016-09-17 ENCOUNTER — Encounter: Payer: Self-pay | Admitting: Nurse Practitioner

## 2016-09-17 VITALS — BP 137/77 | HR 103 | Ht 66.0 in | Wt 107.8 lb

## 2016-09-17 DIAGNOSIS — E785 Hyperlipidemia, unspecified: Secondary | ICD-10-CM

## 2016-09-17 DIAGNOSIS — R1312 Dysphagia, oropharyngeal phase: Secondary | ICD-10-CM | POA: Diagnosis not present

## 2016-09-17 DIAGNOSIS — R471 Dysarthria and anarthria: Secondary | ICD-10-CM | POA: Diagnosis not present

## 2016-09-17 DIAGNOSIS — I63312 Cerebral infarction due to thrombosis of left middle cerebral artery: Secondary | ICD-10-CM

## 2016-09-17 NOTE — Progress Notes (Signed)
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 Wife reports that at previous stroke he presented with dysarthria and dysphagia and at one point was on a special diet with thickened liquids but has been on regular diet with thin liquids for "quite some time". The wife has noticed over the past 1 year progressive functional decline including difficulties with thin liquids that have worsened over the past week as well as significant worsening ofdysarthria over the past one week. He has had an unsteady gait and he has fallen at least once. He's hadto transition from a cane to a rolling walker. Currently he is very dysarthric. His last known well is unknown. Patient was not administered IV t-PA secondary to being out of window. He was admitted for further evaluation and treatment. MRI of the brain small left lentiform nucleus infarct secondary to small vessel disease. CT of the head chronic lacunes. 2-D echo EF 55-60%. LDL 58. Hemoglobin A1c 5.8. He returns to the stroke clinic today for follow-up. [facility] now he is getting PT and OT speech therapy and nursing [facility]. He is making slow progress he remains on Plavix  and aspirin for secondary stroke prevention without further sharp or TIA symptoms. He has minimal bruising and bleeding. He is on Lipitor without complaints of myalgias. He can feed himself, he needs assistance with bathing and dressing. He wears depends and was wearing these prior to his stroke. He's had problems with his memory for several years according to the wife. He returns for reevaluation  REVIEW OF  SYSTEMS: Full 14 system review of systems performed and notable only for those listed, all others are neg:  Constitutional: neg  Cardiovascular: neg Ear/Nose/Throat: neg  Skin: neg Eyes: neg Respiratory: Cough Gastroitestinal: Incontinence of bowel and bladder Hematology/Lymphatic: neg  Endocrine: neg Musculoskeletal: Weakness, walking difficulty Allergy/Immunology: neg Neurological: Speech difficulty Psychiatric: neg Sleep : neg   ALLERGIES: No Known Allergies  HOME MEDICATIONS: Outpatient Medications Prior to Visit  Medication Sig Dispense Refill  . aspirin EC 81 MG tablet Take 81 mg by mouth daily.    Marland Kitchen atorvastatin (LIPITOR) 10 MG tablet TAKE 1 TABLET BY MOUTH DAILY. 90 tablet 3  . clopidogrel (PLAVIX) 75 MG tablet TAKE 1 TABLET BY MOUTH ONCE A DAY 90 tablet 1  . famotidine (PEPCID) 20 MG tablet Take 1 tablet (20 mg total) by mouth daily.    . Nutritional Supplements (NUTRITIONAL SUPPLEMENT PO) Take by mouth. House 2.0 - Med Pass 120 cc by mouth 2 times a day     No facility-administered medications prior to visit.     PAST MEDICAL HISTORY: Past Medical History:  Diagnosis Date  . Diabetes mellitus without complication ([place])   . Hypertension   . Stroke [facility])     PAST SURGICAL HISTORY: Past Surgical History:  Procedure Laterality Date  . CARDIAC CATHETERIZATION    . EYE SURGERY      FAMILY HISTORY: Family History  Problem Relation Age of Onset  . Cancer Mother     SOCIAL HISTORY: Social History   Social History  . Marital status: Married    Spouse name: N/A  . Number of children: N/A  .  Years of education: N/A   Occupational History  . Not on file.   Social History Main Topics  . Smoking status: Never Smoker  . Smokeless tobacco: Never Used  . Alcohol use No     Comment: 09/03/14 one glass of wine weekly  . Drug use: No  . Sexual activity: Not on file   Other Topics Concern  . Not on file   Social History Narrative  . No narrative on file      PHYSICAL EXAM  Vitals:   09/17/16 1116 09/17/16 1131  BP: (!) 144/91 137/77  Pulse: (!) 108 (!) 103  Weight: 107 lb 12.8 oz (48.9 kg)   Height: 5\' 6"  (1.676 m)    Body mass index is 17.4 kg/m.  Generalized: Well developed, Frail appearing male in no acute distress  Head: normocephalic and atraumatic,. Oropharynx benign  Neck: Supple, no carotid bruits  Cardiac: Regular rate rhythm, no murmur  Musculoskeletal: No deformity   Neurological examination   Mentation: Alert , oriented to time, year, history wife Attention span and concentration appropriate. Follows all commands severe dysarthria  Cranial nerve II-XII: .Pupils were equal round reactive to light extraocular movements were full, visual field were full on confrontational test. Facial sensation and strength were normal. hearing was intact to finger rubbing bilaterally. Uvula tongue midline. head turning and shoulder shrug were normal and symmetric.Tongue protrusion into cheek strength was normal. Raspy voice with dysarthria Motor: normal bulk and tone, full strength in the BUE, BLE, on the left, 4 out of 5 on the right, right  pronator drift.  Sensory: normal and symmetric to light touch, in all 4 extremities Coordination: finger-nose-finger, no dysmetria  Reflexes: 1+ upper lower and symmetric, plantar responses were flexor bilaterally. Gait and Station: Rising up from seated position with push off, slow steady gait with rolling walker   DIAGNOSTIC DATA (LABS, IMAGING, TESTING) - I reviewed patient records, labs, notes, testing and imaging myself where available.  Lab Results  Component Value Date   WBC 7.7 07/23/2016   HGB 11.6 (A) 07/23/2016   HCT 35 (A) 07/23/2016   MCV 90.8 07/15/2016   PLT 251 07/23/2016      Component Value Date/Time   NA 144 07/23/2016   K 4.5 07/23/2016   CL 106 07/15/2016 0501   CO2 25 07/15/2016 0501   GLUCOSE 92 07/15/2016 0501   BUN 21 07/23/2016   CREATININE 0.9 07/23/2016    CREATININE 0.89 07/15/2016 0501   CALCIUM 8.6 (L) 07/15/2016 0501   PROT 6.4 (L) 07/13/2016 0353   ALBUMIN 3.3 (L) 07/13/2016 0353   AST 21 07/13/2016 0353   ALT 12 (L) 07/13/2016 0353   ALKPHOS 62 07/13/2016 0353   BILITOT 0.5 07/13/2016 0353   GFRNONAA >60 07/15/2016 0501   GFRAA >60 07/15/2016 0501   Lab Results  Component Value Date   CHOL 121 07/14/2016   HDL 51 07/14/2016   LDLCALC 58 07/14/2016   TRIG 59 07/14/2016   CHOLHDL 2.4 07/14/2016   Lab Results  Component Value Date   HGBA1C 5.8 (H) 07/14/2016    ASSESSMENT AND PLAN  83 y.o. year old male  has a past medical history of Diabetes mellitus without complication ([place]); Hypertension; and Stroke [facility]). here For hospital follow-upMRI of the brain small left lentiform nucleus infarct secondary to small vessel disease.The patient is a current patient of Dr. Erlinda Hong  who is out of the office today . This note is sent to the work in doctor.  PLAN: Stressed the importance of management of risk factors to prevent further stroke Continue Aspirin and Plavix for secondary stroke prevention for 3 months then Plavix only on 10/16/16 Maintain strict control of hypertension with blood pressure goal below 130/90, today's reading 137/77 Control of diabetes with hemoglobin A1c below 6.5 followed by primary care most recent hemoglobin A1c 5.8 Cholesterol with LDL cholesterol less than 70, followed by primary care,  most recent 58  continue lipitor Continue all rehab for now then home exercise program Recommended thickened liquids to prevent aspiration  Follow up 6 months Discussed risk for recurrent stroke/ TIA and answered additional questions This was a prolonged visit requiring 30 minutes and medical decision making of high complexity with extensive review of history, hospital chart, counseling and answering questions for wife Dennie Bible, [facility], [facility], [facility] [address], [place],  [place] [address] [phone]

## 2016-09-17 NOTE — Patient Instructions (Signed)
Stressed the importance of management of risk factors to prevent further stroke Continue Aspirin and Plavix for secondary stroke prevention for 3 months then Plavix only on 10/16/16 Maintain strict control of hypertension with blood pressure goal below 130/90, today's reading 137/77 Control of diabetes with hemoglobin A1c below 6.5 followed by primary care most recent hemoglobin A1c 5.8 Cholesterol with LDL cholesterol less than 70, followed by primary care,  most recent 58  continue lipitor Continue all rehab for now then home exercise program Recommended thickened liquids to prevent aspiration  Follow up 6 months

## 2016-09-20 NOTE — Progress Notes (Signed)
I have reviewed and agreed above plan. 

## 2017-03-11 ENCOUNTER — Other Ambulatory Visit
Admission: RE | Admit: 2017-03-11 | Discharge: 2017-03-11 | Disposition: A | Discharge status: Home / Self Care | Payer: Medicare Other | Source: Ambulatory Visit | Attending: Physician Assistant | Admitting: Physician Assistant

## 2017-03-11 ENCOUNTER — Encounter: Payer: Medicare Other | Attending: Physician Assistant | Admitting: Physician Assistant

## 2017-03-11 DIAGNOSIS — J45909 Unspecified asthma, uncomplicated: Secondary | ICD-10-CM | POA: Insufficient documentation

## 2017-03-11 DIAGNOSIS — I69998 Other sequelae following unspecified cerebrovascular disease: Secondary | ICD-10-CM | POA: Insufficient documentation

## 2017-03-11 DIAGNOSIS — Z7984 Long term (current) use of oral hypoglycemic drugs: Secondary | ICD-10-CM | POA: Insufficient documentation

## 2017-03-11 DIAGNOSIS — L89309 Pressure ulcer of unspecified buttock, unspecified stage: Secondary | ICD-10-CM | POA: Diagnosis present

## 2017-03-11 DIAGNOSIS — E11622 Type 2 diabetes mellitus with other skin ulcer: Secondary | ICD-10-CM | POA: Insufficient documentation

## 2017-03-11 DIAGNOSIS — L89223 Pressure ulcer of left hip, stage 3: Secondary | ICD-10-CM | POA: Diagnosis not present

## 2017-03-14 LAB — AEROBIC CULTURE W GRAM STAIN (SUPERFICIAL SPECIMEN)

## 2017-03-14 NOTE — Progress Notes (Signed)
GILMAN, OLAZABAL ([id number]) Visit Report for 03/11/2017 Chief Complaint Document Details Patient Name: Clinton [name], [name] Date of Service: 03/11/2017 10:30 AM Medical Record Number: [id number] Patient Account Number: [id number] Date of Birth/Sex: Jun 23, 1923 (83 y.o. Male) Treating RN: Ahmed Prima Primary Care Provider: Wenda Low Other Clinician: Referring Provider: Gaye Alken Treating Provider/Extender: Melburn Hake, HOYT Weeks in Treatment: 0 Information Obtained from: Patient Chief Complaint Left Ischial Tuberosity Ulcer Electronic Signature(s) Signed: 03/11/2017 5:05:22 PM By: Worthy Keeler PA-C Entered By: Worthy Keeler on 03/11/2017 16:32:01 Homann, Emmanual C. ([id number]) -------------------------------------------------------------------------------- HPI Details Patient Name: Clinton [name], [name] Date of Service: 03/11/2017 10:30 AM Medical Record Number: [id number] Patient Account Number: [id number] Date of Birth/Sex: 28-Dec-1923 (83 y.o. Male) Treating RN: Ahmed Prima Primary Care Provider: Wenda Low Other Clinician: Referring Provider: Gaye Alken Treating Provider/Extender: Melburn Hake, HOYT Weeks in Treatment: 0 History of Present Illness HPI Description: 03/11/17 patient presents today for evaluation concerning an ulcer which was initially noted according to records reviewed on 03/06/17. Unfortunately it appears that it has progressed really rapidly according to patient's wife who was present during the evaluation today. He is a stroke patient and in fact has had multiple CVA's with the last one being February of this year 2018. With that being said this particular ulceration in the left ischial tuberosity location appears to be causing him a significant amount of discomfort. It also seems to be worsening and appears infected with surrounding erythema noted at this point. No fevers, chills, nausea, or vomiting noted at this time. Patient does have diabetes  which is stated to be controlled based on notes again reviewed. Unfortunately secondary to mental status is unable to rate or describe his pain. Electronic Signature(s) Signed: 03/11/2017 5:05:22 PM By: Worthy Keeler PA-C Entered By: Worthy Keeler on 03/11/2017 16:34:20 Baskins, Maclain C. ([id number]) -------------------------------------------------------------------------------- Physical Exam Details Patient Name: Clinton [name], [name] Date of Service: 03/11/2017 10:30 AM Medical Record Number: [id number] Patient Account Number: [id number] Date of Birth/Sex: Jun 10, 1923 (83 y.o. Male) Treating RN: Ahmed Prima Primary Care Provider: Wenda Low Other Clinician: Referring Provider: Gaye Alken Treating Provider/Extender: STONE III, HOYT Weeks in Treatment: 0 Constitutional supine blood pressure is within target range for patient.. pulse regular and within target range for patient.Marland Kitchen respirations regular, non-labored and within target range for patient.Marland Kitchen temperature within target range for patient.. Chronically ill appearing but in no apparent acute distress. Eyes conjunctiva clear no eyelid edema noted. pupils equal round and reactive to light and accommodation. Ears, Nose, Mouth, and Throat no gross abnormality of ear auricles or external auditory canals. mucus membranes moist. Respiratory normal breathing without difficulty. clear to auscultation bilaterally. Cardiovascular regular rate and rhythm with normal S1, S2. no clubbing, cyanosis, significant edema, <3 sec cap refill. Gastrointestinal (GI) soft, non-tender, non-distended, +BS. no ventral hernia noted. Musculoskeletal Patient unable to walk without assistance. Psychiatric Patient is not able to cooperate in decision making regarding care. Patient is oriented to person only. pleasant and cooperative. Notes No debridement was performed today although I did obtain a wound culture in order to evaluate for what organisms  may be causing this issue he did have I significant amount of purulent discharge which was expressed in this is what was cultured. This was a tender procedure for him based on his facial expressions. Electronic Signature(s) Signed: 03/11/2017 5:05:22 PM By: Worthy Keeler PA-C Entered By: Worthy Keeler on 03/11/2017 16:35:44 Tobin, Rich C. ([id number]) -------------------------------------------------------------------------------- Physician Orders Details Patient Name: MASSING, Panfilo C. Date  of Service: 03/11/2017 10:30 AM Medical Record Number: [id number] Patient Account Number: [id number] Date of Birth/Sex: 09/30/1923 (83 y.o. Male) Treating RN: Ahmed Prima Primary Care Provider: Wenda Low Other Clinician: Referring Provider: Gaye Alken Treating Provider/Extender: Melburn Hake, HOYT Weeks in Treatment: 0 Verbal / Phone Orders: Yes Clinician: Pinkerton, Debi Read Back and Verified: Yes Diagnosis Coding Wound Cleansing Wound #1 Left Ischial Tuberosity o Clean wound with Normal Saline. o Cleanse wound with mild soap and water o May Shower, gently pat wound dry prior to applying new dressing. Anesthetic Wound #1 Left Ischial Tuberosity o Topical Lidocaine 4% cream applied to wound bed prior to debridement Skin Barriers/Peri-Wound Care Wound #1 Left Ischial Tuberosity o Skin Prep Primary Wound Dressing Wound #1 Left Ischial Tuberosity o Silvercel Non-Adherent Secondary Dressing Wound #1 Left Ischial Tuberosity o Dry Gauze o Boardered Foam Dressing Dressing Change Frequency Wound #1 Left Ischial Tuberosity o Change dressing every other day. o Other: - as needed Follow-up Appointments Wound #1 Left Ischial Tuberosity o Return Appointment in 1 week. Off-Loading Wound #1 Left Ischial Tuberosity o Turn and reposition every 2 hours Additional Orders / Instructions Wound #1 Left Ischial Tuberosity o Increase protein intake. [employer], Donevan  C. ([id number]) Medications-please add to medication list. o P.O. Antibiotics - start antibiotic o Other: - Vitamin C, Zinc, MVI Laboratory o Bacteria identified in Wound by Culture (MICRO) oooo LOINC Code: 7026-3 ZCHY Convenience Name: Wound culture routine Patient Medications Allergies: NKDA Notifications Medication Indication Start End Bactrim DS 03/11/2017 DOSE 1 - oral 800 mg-160 mg tablet - 1 tablet oral taken every 12 hours x 10 days Electronic Signature(s) Signed: 03/11/2017 4:40:33 PM By: Worthy Keeler PA-C Previous Signature: 03/11/2017 3:57:11 PM Version By: Alric Quan Entered By: Worthy Keeler on 03/11/2017 16:40:32 [place], Rushi C. ([id number]) -------------------------------------------------------------------------------- Problem List Details Patient Name: MICHAUX, [name] Date of Service: 03/11/2017 10:30 AM Medical Record Number: [id number] Patient Account Number: [id number] Date of Birth/Sex: Oct 16, 1923 (83 y.o. Male) Treating RN: Ahmed Prima Primary Care Provider: Wenda Low Other Clinician: Referring Provider: Gaye Alken Treating Provider/Extender: Melburn Hake, HOYT Weeks in Treatment: 0 Active Problems ICD-10 Encounter Code Description Active Date Diagnosis L89.223 Pressure ulcer of left hip, stage 3 03/11/2017 Yes E11.622 Type 2 diabetes mellitus with other skin ulcer 03/11/2017 Yes I69.998 Other sequelae following unspecified cerebrovascular disease 03/11/2017 Yes Inactive Problems Resolved Problems Electronic Signature(s) Signed: 03/11/2017 5:05:22 PM By: Worthy Keeler PA-C Entered By: Worthy Keeler on 03/11/2017 16:31:39 Hosier, Rucker C. ([id number]) -------------------------------------------------------------------------------- Progress Note Details Patient Name: Clinton [name], [name] Date of Service: 03/11/2017 10:30 AM Medical Record Number: [id number] Patient Account Number: [id number] Date of Birth/Sex: [birth year] (83 y.o.  Male) Treating RN: Ahmed Prima Primary Care Provider: Wenda Low Other Clinician: Referring Provider: Gaye Alken Treating Provider/Extender: Melburn Hake, HOYT Weeks in Treatment: 0 Subjective Chief Complaint Information obtained from Patient Left Ischial Tuberosity Ulcer History of Present Illness (HPI) 03/11/17 patient presents today for evaluation concerning an ulcer which was initially noted according to records reviewed on 03/06/17. Unfortunately it appears that it has progressed really rapidly according to patient's wife who was present during the evaluation today. He is a stroke patient and in fact has had multiple CVA's with the last one being February of this year 2018. With that being said this particular ulceration in the left ischial tuberosity location appears to be causing him a significant amount of discomfort. It also seems to be worsening and appears infected with surrounding erythema noted at this point.  No fevers, chills, nausea, or vomiting noted at this time. Patient does have diabetes which is stated to be controlled based on notes again reviewed. Unfortunately secondary to mental status is unable to rate or describe his pain. Wound History Patient presents with 1 open wound that has been present for approximately 1 week. Patient has been treating wound in the following manner: abt ointment. Laboratory tests have not been performed in the last month. Patient reportedly has not tested positive for an antibiotic resistant organism. Patient reportedly has not tested positive for osteomyelitis. Patient reportedly has not had testing performed to evaluate circulation in the legs. Patient History Unable to Obtain Patient History due to Aphasia. Information obtained from Caregiver. Allergies NKDA Social History Never smoker, Marital Status - Married, Alcohol Use - Never, Drug Use - No History, Caffeine Use - Daily. Medical History Eyes Patient has history of  Cataracts Respiratory Patient has history of Asthma Endocrine Patient has history of Type II Diabetes Oncologic Denies history of Received Chemotherapy, Received Radiation Patient is treated with Oral Agents. Blood sugar is not tested. Review of Systems (ROS) Constitutional Symptoms [facility]) LUDLAM, Ikeem C. ([id number]) The patient has no complaints or symptoms. Eyes Complains or has symptoms of Glasses / Contacts. Ear/Nose/Mouth/Throat stroke affects swallowing and speaking Hematologic/Lymphatic The patient has no complaints or symptoms. Cardiovascular stroke hyperlipidemia Gastrointestinal The patient has no complaints or symptoms. Genitourinary Complains or has symptoms of Incontinence/dribbling. Immunological The patient has no complaints or symptoms. Integumentary (Skin) The patient has no complaints or symptoms. Musculoskeletal The patient has no complaints or symptoms. Neurologic stroke affects speech Oncologic prostate Psychiatric The patient has no complaints or symptoms. General Notes: Pt has had a stroke and unable to talk much, his wife is giving history and she does not know patients family history. Objective Constitutional supine blood pressure is within target range for patient.. pulse regular and within target range for patient.Marland Kitchen respirations regular, non-labored and within target range for patient.Marland Kitchen temperature within target range for patient.. Chronically ill appearing but in no apparent acute distress. Vitals Time Taken: 10:56 AM, Height: 61 in, Source: Stated, Weight: 98 lbs, Source: Stated, BMI: 18.5, Temperature: 98.2 F, Pulse: 101 bpm, Respiratory Rate: 16 breaths/min, Blood Pressure: 132/74 mmHg. Eyes conjunctiva clear no eyelid edema noted. pupils equal round and reactive to light and accommodation. Ears, Nose, Mouth, and Throat no gross abnormality of ear auricles or external auditory canals. mucus membranes  moist. Respiratory normal breathing without difficulty. clear to auscultation bilaterally. Cardiovascular regular rate and rhythm with normal S1, S2. no clubbing, cyanosis, significant edema, Gastrointestinal (GI) Clinton [name], [name] ([id number]) soft, non-tender, non-distended, +BS. no ventral hernia noted. Musculoskeletal Patient unable to walk without assistance. Psychiatric Patient is not able to cooperate in decision making regarding care. Patient is oriented to person only. pleasant and cooperative. General Notes: No debridement was performed today although I did obtain a wound culture in order to evaluate for what organisms may be causing this issue he did have I significant amount of purulent discharge which was expressed in this is what was cultured. This was a tender procedure for him based on his facial expressions. Integumentary (Hair, Skin) Wound #1 status is Open. Original cause of wound was Pressure Injury. The wound is located on the Left Ischial Tuberosity. The wound measures 2.9cm length x 3.4cm width x 0.1cm depth; 7.744cm^2 area and 0.774cm^3 volume. There is no tunneling or undermining noted. There is a large amount of serosanguineous drainage noted. The wound margin is  distinct with the outline attached to the wound base. There is small (1-33%) red granulation within the wound bed. There is a large (67-100%) amount of necrotic tissue within the wound bed including Eschar and Adherent Slough. The periwound skin appearance exhibited: Excoriation, Maceration, Erythema. The surrounding wound skin color is noted with erythema which is circumferential. Periwound temperature was noted as No Abnormality. The periwound has tenderness on palpation. Assessment Active Problems ICD-10 L89.223 - Pressure ulcer of left hip, stage 3 E11.622 - Type 2 diabetes mellitus with other skin ulcer I69.998 - Other sequelae following unspecified cerebrovascular disease Plan Wound  Cleansing: Wound #1 Left Ischial Tuberosity: Clean wound with Normal Saline. Cleanse wound with mild soap and water May Shower, gently pat wound dry prior to applying new dressing. Anesthetic: Wound #1 Left Ischial Tuberosity: Topical Lidocaine 4% cream applied to wound bed prior to debridement Skin Barriers/Peri-Wound Care: Wound #1 Left Ischial Tuberosity: Skin Prep Primary Wound Dressing: Wound #1 Left Ischial Tuberosity: Silvercel Non-Adherent Secondary Dressing: Wound #1 Left Ischial Tuberosity: Kempker, Isley C. ([id number]) Dry Gauze Boardered Foam Dressing Dressing Change Frequency: Wound #1 Left Ischial Tuberosity: Change dressing every other day. Other: - as needed Follow-up Appointments: Wound #1 Left Ischial Tuberosity: Return Appointment in 1 week. Off-Loading: Wound #1 Left Ischial Tuberosity: Turn and reposition every 2 hours Additional Orders / Instructions: Wound #1 Left Ischial Tuberosity: Increase protein intake. Medications-please add to medication list.: P.O. Antibiotics - start antibiotic Other: - Vitamin C, Zinc, MVI Laboratory ordered were: Wound culture routine The following medication(s) was prescribed: Bactrim DS oral 800 mg-160 mg tablet 1 1 tablet oral taken every 12 hours x 10 days starting 03/11/2017 At this point I recommended that we initiate a silver alginate dressing for him. I am gonna send in Septra DS as well to treat what is likely and infection and we will see what the culture states in order to make any changes necessary going forward. In the meantime hopefully the Septra will be beneficial. We will see him back for reevaluation in one week to see were things stand. I did also discuss with patient's wife offloading techniques to try and help with any further injury. If anything worsens they will contact our office otherwise again we will see were he is in one weeks time. Please see above for specific wound care orders. Electronic  Signature(s) Signed: 03/11/2017 5:05:22 PM By: Worthy Keeler PA-C Entered By: Worthy Keeler on 03/11/2017 16:41:44 Lobdell, Johnathon C. ([id number]) -------------------------------------------------------------------------------- ROS/PFSH Details Patient Name: CHOI, Antonia C. Date of Service: 03/11/2017 10:30 AM Medical Record Number: [id number] Patient Account Number: [id number] Date of Birth/Sex: July 04, 1923 (83 y.o. Male) Treating RN: Ahmed Prima Primary Care Provider: Wenda Low Other Clinician: Referring Provider: Gaye Alken Treating Provider/Extender: Melburn Hake, HOYT Weeks in Treatment: 0 Unable to Obtain Patient History due to oo Aphasia Information Obtained From Caregiver Wound History Do you currently have one or more open woundso Yes How many open wounds do you currently haveo 1 Approximately how long have you had your woundso 1 week How have you been treating your wound(s) until nowo abt ointment Has your wound(s) ever healed and then re-openedo No Have you had any lab work done in the past montho No Have you tested positive for an antibiotic resistant organism (MRSA, VRE)o No Have you tested positive for osteomyelitis (bone infection)o No Have you had any tests for circulation on your legso No Eyes Complaints and Symptoms: Positive for: Glasses / Contacts Medical History: Positive for:  Cataracts Genitourinary Complaints and Symptoms: Positive for: Incontinence/dribbling Constitutional Symptoms (General Health) Complaints and Symptoms: No Complaints or Symptoms Ear/Nose/Mouth/Throat Complaints and Symptoms: Review of System Notes: stroke affects swallowing and speaking Hematologic/Lymphatic Complaints and Symptoms: No Complaints or Symptoms Respiratory Medical History: Positive for: Asthma Clinton [name], [name] ([id number]) Cardiovascular Complaints and Symptoms: Review of System Notes: stroke hyperlipidemia Gastrointestinal Complaints and  Symptoms: No Complaints or Symptoms Endocrine Medical History: Positive for: Type II Diabetes Time with diabetes: 10 yrs Treated with: Oral agents Blood sugar tested every day: No Immunological Complaints and Symptoms: No Complaints or Symptoms Integumentary (Skin) Complaints and Symptoms: No Complaints or Symptoms Musculoskeletal Complaints and Symptoms: No Complaints or Symptoms Neurologic Complaints and Symptoms: Review of System Notes: stroke affects speech Oncologic Complaints and Symptoms: Review of System Notes: prostate Medical History: Negative for: Received Chemotherapy; Received Radiation Psychiatric Complaints and Symptoms: No Complaints or Symptoms HBO Extended History Items Eyes: Cataracts Clinton [name], [name] ([id number]) Immunizations Pneumococcal Vaccine: Received Pneumococcal Vaccination: Yes Implantable Devices Family and Social History Never smoker; Marital Status - Married; Alcohol Use: Never; Drug Use: No History; Caffeine Use: Daily; Financial Concerns: No; Food, Clothing or Shelter Needs: No; Support System Lacking: No; Transportation Concerns: No; Advanced Directives: No; Patient does not want information on Advanced Directives; Do not resuscitate: No; Living Will: No; Medical Power of Attorney: No Notes Pt has had a stroke and unable to talk much, his wife is giving history and she does not know patients family history. Electronic Signature(s) Signed: 03/11/2017 3:57:11 PM By: Alric Quan Signed: 03/11/2017 5:05:22 PM By: Worthy Keeler PA-C Entered By: Alric Quan on 03/11/2017 11:04:30 Goedde, Quindon C. ([id number]) -------------------------------------------------------------------------------- SuperBill Details Patient Name: Clinton [name], [name] Date of Service: 03/11/2017 Medical Record Number: [id number] Patient Account Number: [id number] Date of Birth/Sex: 01/01/1924 (83 y.o. Male) Treating RN: Ahmed Prima Primary Care Provider:  Wenda Low Other Clinician: Referring Provider: Gaye Alken Treating Provider/Extender: Melburn Hake, HOYT Weeks in Treatment: 0 Diagnosis Coding ICD-10 Codes Code Description [phone] Pressure ulcer of left hip, stage 3 E11.622 Type 2 diabetes mellitus with other skin ulcer I69.998 Other sequelae following unspecified cerebrovascular disease Facility Procedures CPT4 Code: [id number] Description: 99214 - WOUND CARE VISIT-LEV 4 EST PT Modifier: Quantity: 1 Physician Procedures CPT4 Code: [id number] Description: 81448 - WC PHYS LEVEL 4 - NEW PT ICD-10 Diagnosis Description L89.223 Pressure ulcer of left hip, stage 3 E11.622 Type 2 diabetes mellitus with other skin ulcer I69.998 Other sequelae following unspecified cerebrovascular dise Modifier: ase Quantity: 1 Electronic Signature(s) Signed: 03/11/2017 5:05:22 PM By: Worthy Keeler PA-C Previous Signature: 03/11/2017 2:32:23 PM Version By: Alric Quan Entered By: Worthy Keeler on 03/11/2017 16:42:38

## 2017-03-14 NOTE — Progress Notes (Signed)
MAVERIK, FOOT ([id number]) Visit Report for 03/11/2017 Abuse/Suicide Risk Screen Details Patient Name: RETHERFORD, Trampas C. Date of Service: 03/11/2017 10:30 AM Medical Record Number: [id number] Patient Account Number: [id number] Date of Birth/Sex: Aug 02, 1923 (83 y.o. Male) Treating RN: Ahmed Prima Primary Care Seanmichael Salmons: Wenda Low Other Clinician: Referring Hadlea Furuya: Gaye Alken Treating Shavon Ashmore/Extender: Melburn Hake, HOYT Weeks in Treatment: 0 Abuse/Suicide Risk Screen Items Answer Notes pt unable to answer d/t speech d/t CVA Electronic Signature(s) Signed: 03/11/2017 3:57:11 PM By: Alric Quan Entered By: Alric Quan on 03/11/2017 11:04:50 [place], [place]. ([id number]) -------------------------------------------------------------------------------- Activities of Daily Living Details Patient Name: SAKUMA, Cauy C. Date of Service: 03/11/2017 10:30 AM Medical Record Number: [id number] Patient Account Number: [id number] Date of Birth/Sex: April 02, 1924 (83 y.o. Male) Treating RN: Ahmed Prima Primary Care Azalya Galyon: Wenda Low Other Clinician: Referring Pedrohenrique Mccon[place]: Gaye Alken Treating Skylynn Burkley/Extender: Melburn Hake, HOYT Weeks in Treatment: 0 Activities of Daily Living Items Answer Activities of Daily Living (Please select one for each item) Drive Automobile Not Able Take Medications Not Able Use Telephone Not Able Care for Appearance Not Able Use Toilet Not Able Bath / Shower Need Assistance Dress Self Not Able Feed Self Completely Able Walk Need Assistance Get In / Out Bed Need Assistance Housework Not Able Prepare Meals Not Able Handle Money Not Able Shop for Self Not Able Electronic Signature(s) Signed: 03/11/2017 3:57:11 PM By: Alric Quan Entered By: Alric Quan on 03/11/2017 11:05:31 Lessley, Clayvon C. ([id number]) -------------------------------------------------------------------------------- Education Assessment Details Patient Name:  GASSMANN, Dominic C. Date of Service: 03/11/2017 10:30 AM Medical Record Number: [id number] Patient Account Number: [id number] Date of Birth/Sex: 03/13/1924 (83 y.o. Male) Treating RN: Ahmed Prima Primary Care Chisum Habenicht: Wenda Low Other Clinician: Referring Greta Yung: Gaye Alken Treating Thera Basden/Extender: Melburn Hake, HOYT Weeks in Treatment: 0 Primary Learner Assessed: Caregiver wife pt unable to talk much d/t Reason Patient is not Primary Learner: past CVA Learning Preferences/Education Level/Primary Language Learning Preference: Explanation, Printed Material Highest Education Level: College or Above Preferred Language: Diplomatic Services operational officer Assessment/Beliefs Language Barrier: No Translator Needed: No Memory Deficit: No Emotional Barrier: No Cultural/Religious Beliefs Affecting Medical Care: No Physical Barrier Assessment Impaired Vision: No Impaired Hearing: No Decreased Hand dexterity: No Knowledge/Comprehension Assessment Knowledge Level: Medium Comprehension Level: Medium Ability to understand written Medium instructions: Ability to understand verbal Medium instructions: Motivation Assessment Anxiety Level: Calm Cooperation: Cooperative Education Importance: Acknowledges Need Interest in Health Problems: Asks Questions Perception: Coherent Willingness to Engage in Self- Medium Management Activities: Readiness to Engage in Self- Medium Management Activities: Electronic Signature(s) Signed: 03/11/2017 3:57:11 PM By: Alric Quan Entered By: Alric Quan on 03/11/2017 11:06:16 Henning, Derrick C. ([id number]) -------------------------------------------------------------------------------- Fall Risk Assessment Details Patient Name: WEIDNER, Adryel C. Date of Service: 03/11/2017 10:30 AM Medical Record Number: [id number] Patient Account Number: [id number] Date of Birth/Sex: [birth year] (83 y.o. Male) Treating RN: Ahmed Prima Primary Care Tae Robak:  Wenda Low Other Clinician: Referring Arriah Wadle: Gaye Alken Treating Edrik Rundle/Extender: Melburn Hake, HOYT Weeks in Treatment: 0 Fall Risk Assessment Items Have you had 2 or more falls in the last 12 monthso 0 Yes Have you had any fall that resulted in injury in the last 12 monthso 0 Yes FALL RISK ASSESSMENT: History of falling - immediate or within 3 months 0 No Secondary diagnosis 15 Yes Ambulatory aid None/bed rest/wheelchair/nurse 0 No Crutches/cane/walker 15 Yes Furniture 0 No IV Access/Saline Lock 0 No Gait/Training Normal/bed rest/immobile 0 No Weak 10 Yes Impaired 20 Yes Mental Status Oriented to own ability 0 No Electronic Signature(s) Signed: 03/11/2017 3:57:11 PM By:  Alric Quan Entered By: Alric Quan on 03/11/2017 11:06:49 Scarlata, Zak C. ([id number]) -------------------------------------------------------------------------------- Foot Assessment Details Patient Name: COKER, Romelo C. Date of Service: 03/11/2017 10:30 AM Medical Record Number: [id number] Patient Account Number: [id number] Date of Birth/Sex: [birth year] (83 y.o. Male) Treating RN: Ahmed Prima Primary Care Vessie Olmsted: Wenda Low Other Clinician: Referring Knoxx Boeding: Gaye Alken Treating Firas Guardado/Extender: Melburn Hake, HOYT Weeks in Treatment: 0 Foot Assessment Items Site Locations + = Sensation present, - = Sensation absent, C = Callus, U = Ulcer R = Redness, W = Warmth, M = Maceration, PU = Pre-ulcerative lesion F = Fissure, S = Swelling, D = Dryness Assessment Right: Left: Other Deformity: No No Prior Foot Ulcer: No No Prior Amputation: No No Charcot Joint: No No Ambulatory Status: Gait: Electronic Signature(s) Signed: 03/11/2017 3:57:11 PM By: Alric Quan Entered By: Alric Quan on 03/11/2017 11:07:13 Artley, Linwood C. ([id number]) -------------------------------------------------------------------------------- Nutrition Risk Assessment Details Patient Name:  SLOWEY, Lonnel C. Date of Service: 03/11/2017 10:30 AM Medical Record Number: [id number] Patient Account Number: [id number] Date of Birth/Sex: Sep 03, 1923 (83 y.o. Male) Treating RN: Ahmed Prima Primary Care Lauren Modisette: Wenda Low Other Clinician: Referring Troy Kanouse: Gaye Alken Treating Gelisa Tieken/Extender: Melburn Hake, HOYT Weeks in Treatment: 0 Height (in): 61 Weight (lbs): 98 Body Mass Index (BMI): 18.5 Nutrition Risk Assessment Items NUTRITION RISK SCREEN: I have an illness or condition that made me change the kind and/or amount of 2 Yes food I eat I eat fewer than two meals per day 3 Yes I eat few fruits and vegetables, or milk products 0 No I have three or more drinks of beer, liquor or wine almost every day 0 No I have tooth or mouth problems that make it hard for me to eat 0 No I don't always have enough money to buy the food I need 0 No I eat alone most of the time 0 No I take three or more different prescribed or over-the-counter drugs a day 1 Yes Without wanting to, I have lost or gained 10 pounds in the last six months 2 Yes I am not always physically able to shop, cook and/or feed myself 0 No Nutrition Protocols Good Risk Protocol Moderate Risk Protocol Provide education on High Risk Proctocol 0 Electronic Signature(s) Signed: 03/11/2017 3:57:11 PM By: Alric Quan Entered By: Alric Quan on 03/11/2017 11:07:06

## 2017-03-14 NOTE — Progress Notes (Addendum)
AYDON, SWAMY ([id number]) Visit Report for 03/11/2017 Allergy List Details Patient Name: Clinton [name], [name] Date of Service: 03/11/2017 10:30 AM Medical Record Number: [id number] Patient Account Number: [id number] Date of Birth/Sex: 1923/10/23 (83 y.o. Male) Treating RN: Clinton [name] Primary Care Clinton [name]: Clinton [name] Other Clinician: Referring Clinton [name]: Clinton [name] Treating Clinton [name]/Extender: Clinton [name], Clinton [name] Weeks in Treatment: 0 Allergies Active Allergies NKDA Allergy Notes Electronic Signature(s) Signed: 03/11/2017 3:57:11 PM By: Clinton [name] Entered By: Clinton [name] on 03/11/2017 10:58:08 [name], [name] ([id number]) -------------------------------------------------------------------------------- Arrival Information Details Patient Name: Clinton [name], [name] Date of Service: 03/11/2017 10:30 AM Medical Record Number: [id number] Patient Account Number: [id number] Date of Birth/Sex: [birth year] (83 y.o. Male) Treating RN: Clinton [name] Primary Care Clinton [name]: Clinton [name] Other Clinician: Referring Clinton [name]: Clinton [name] Treating Clinton [name]/Extender: Clinton [name], Clinton [name] Weeks in Treatment: 0 Visit Information Patient Arrived: Wheel Chair Arrival Time: 10:50 Accompanied By: wife Transfer Assistance: EasyPivot Patient Lift Patient Identification Verified: Yes Secondary Verification Process Yes Completed: Patient Requires Transmission-Based No Precautions: Patient Has Alerts: Yes Patient Alerts: Patient on Blood Thinner Plavix DM II Electronic Signature(s) Signed: 03/16/2017 11:39:31 AM By: Clinton [name] Previous Signature: 03/11/2017 3:57:11 PM Version By: Clinton [name] Entered By: Clinton [name] on 03/16/2017 11:39:30 [name], [name] ([id number]) -------------------------------------------------------------------------------- Clinic Level of Care Assessment Details Patient Name: Clinton [name], [name] Date of Service: 03/11/2017 10:30 AM Medical Record  Number: [id number] Patient Account Number: [id number] Date of Birth/Sex: 1924/01/04 (83 y.o. Male) Treating RN: Clinton [name] Primary Care Clinton [name]: Clinton [name] Other Clinician: Referring Clinton [name]: Clinton [name] Treating Clinton [name]/Extender: Clinton [name], Clinton [name] Weeks in Treatment: 0 Clinic Level of Care Assessment Items TOOL 2 Quantity Score X - Use when only an EandM is performed on the INITIAL visit 1 0 ASSESSMENTS - Nursing Assessment / Reassessment X - General Physical Exam (combine w/ comprehensive assessment (listed just below) when 1 20 performed on new pt. evals) X- 1 25 Comprehensive Assessment (HX, ROS, Risk Assessments, Wounds Hx, etc.) ASSESSMENTS - Wound and Skin Assessment / Reassessment X - Simple Wound Assessment / Reassessment - one wound 1 5 []  - 0 Complex Wound Assessment / Reassessment - multiple wounds []  - 0 Dermatologic / Skin Assessment (not related to wound area) ASSESSMENTS - Ostomy and/or Continence Assessment and Care []  - Incontinence Assessment and Management 0 []  - 0 Ostomy Care Assessment and Management (repouching, etc.) PROCESS - Coordination of Care []  - Simple Patient / Family Education for ongoing care 0 X- 1 20 Complex (extensive) Patient / Family Education for ongoing care X- 1 10 Staff obtains Programmer, systems, Records, Test Results / Process Orders []  - 0 Staff telephones HHA, Nursing Homes / Clarify orders / etc []  - 0 Routine Transfer to another Facility (non-emergent condition) []  - 0 Routine Hospital Admission (non-emergent condition) X- 1 15 New Admissions / Biomedical engineer / Ordering NPWT, Apligraf, etc. []  - 0 Emergency Hospital Admission (emergent condition) X- 1 10 Simple Discharge Coordination []  - 0 Complex (extensive) Discharge Coordination PROCESS - Special Needs []  - Pediatric / Minor Patient Management 0 []  - 0 Isolation Patient Management [name], [name] ([id number]) []  - 0 Hearing / Language / Visual special  needs []  - 0 Assessment of Community assistance (transportation, D/C planning, etc.) []  - 0 Additional assistance / Altered mentation []  - 0 Support Surface(s) Assessment (bed, cushion, seat, etc.) INTERVENTIONS - Wound Cleansing / Measurement X - Wound Imaging (photographs - any number of wounds) 1 5 []  - 0 Wound Tracing (instead of photographs) X- 1 5 Simple  Wound Measurement - one wound []  - 0 Complex Wound Measurement - multiple wounds X- 1 5 Simple Wound Cleansing - one wound []  - 0 Complex Wound Cleansing - multiple wounds INTERVENTIONS - Wound Dressings X - Small Wound Dressing one or multiple wounds 1 10 []  - 0 Medium Wound Dressing one or multiple wounds []  - 0 Large Wound Dressing one or multiple wounds []  - 0 Application of Medications - injection INTERVENTIONS - Miscellaneous []  - External ear exam 0 X- 1 5 Specimen Collection (cultures, biopsies, blood, body fluids, etc.) []  - 0 Specimen(s) / Culture(s) sent or taken to Lab for analysis X- 1 10 Patient Transfer (multiple staff / Civil Service fast streamer / Similar devices) []  - 0 Simple Staple / Suture removal (25 or less) []  - 0 Complex Staple / Suture removal (26 or more) []  - 0 Hypo / Hyperglycemic Management (close monitor of Blood Glucose) []  - 0 Ankle / Brachial Index (ABI) - do not check if billed separately Has the patient been seen at the hospital within the last three years: Yes Total Score: 145 Level Of Care: New/Established - Level 4 Electronic Signature(s) Signed: 03/11/2017 3:57:11 PM By: Clinton [name] Entered By: Clinton [name] on 03/11/2017 14:32:15 [name], [name] ([id number]) -------------------------------------------------------------------------------- Encounter Discharge Information Details Patient Name: Clinton [name], [name] Date of Service: 03/11/2017 10:30 AM Medical Record Number: [id number] Patient Account Number: [id number] Date of Birth/Sex: [birth year] (83 y.o. Male) Treating RN:  Clinton [name] Primary Care Clinton [name]: Clinton [name] Other Clinician: Referring Clinton [name]: Clinton [name] Treating Clinton [name]/Extender: Clinton [name], Clinton [name] Weeks in Treatment: 0 Encounter Discharge Information Items Discharge Pain Level: 0 Discharge Condition: Stable Ambulatory Status: Wheelchair Discharge Destination: Home Transportation: Private Auto Accompanied By: wife Schedule Follow-up Appointment: Yes Medication Reconciliation completed and No provided to Patient/Care Paralee Pendergrass: Provided on Clinical Summary of Care: 03/11/2017 Form Type Recipient Paper Patient DB Electronic Signature(s) Signed: 03/14/2017 11:56:58 AM By: Ruthine Dose Entered By: Ruthine Dose on 03/11/2017 11:45:43 Knoll, Wess C. ([id number]) -------------------------------------------------------------------------------- Lower Extremity Assessment Details Patient Name: Clinton [name], [name] Date of Service: 03/11/2017 10:30 AM Medical Record Number: [id number] Patient Account Number: [id number] Date of Birth/Sex: 03-21-1924 (83 y.o. Male) Treating RN: Clinton [name] Primary Care Jameeka Marcy: Clinton [name] Other Clinician: Referring Bitania Shankland: Clinton [name] Treating Nathan Stallworth/Extender: Clinton [name], Clinton [name] Weeks in Treatment: 0 Electronic Signature(s) Signed: 03/11/2017 3:57:11 PM By: Clinton [name] Entered By: Clinton [name] on 03/11/2017 11:07:24 Heyne, Volney C. ([id number]) -------------------------------------------------------------------------------- Multi Wound Chart Details Patient Name: Clinton [name], [name] Date of Service: 03/11/2017 10:30 AM Medical Record Number: [id number] Patient Account Number: [id number] Date of Birth/Sex: Jun 06, 1923 (83 y.o. Male) Treating RN: Clinton [name] Primary Care Ajaya Crutchfield: Clinton [name] Other Clinician: Referring Torell Minder: Clinton [name] Treating Allana Shrestha/Extender: Clinton [name], Clinton [name] Weeks in Treatment: 0 Vital Signs Height(in): 61 Pulse(bpm): 101 Weight(lbs): 35 Blood  Pressure(mmHg): 132/74 Body Mass Index(BMI): 19 Temperature(F): 98.2 Respiratory Rate 16 (breaths/min): Photos: [1:No Photos] [N/A:N/A] Wound Location: [1:Left Ischial Tuberosity] [N/A:N/A] Wounding Event: [1:Pressure Injury] [N/A:N/A] Primary Etiology: [1:Pressure Ulcer] [N/A:N/A] Comorbid History: [1:Cataracts, Asthma, Type II Diabetes] [N/A:N/A] Date Acquired: [1:03/04/2017] [N/A:N/A] Weeks of Treatment: [1:0] [N/A:N/A] Wound Status: [1:Open] [N/A:N/A] Measurements L x W x D [1:2.9x3.4x0.1] [N/A:N/A] (cm) Area (cm) : [1:7.744] [N/A:N/A] Volume (cm) : [1:0.774] [N/A:N/A] Classification: [1:Category/Stage [name]] [N/A:N/A] Exudate Amount: [1:Large] [N/A:N/A] Exudate Type: [1:Serosanguineous] [N/A:N/A] Exudate Color: [1:red, Jocson] [N/A:N/A] Wound Margin: [1:Distinct, outline attached] [N/A:N/A] Granulation Amount: [1:None Present (0%)] [N/A:N/A] Necrotic Amount: [1:Large (67-100%)] [N/A:N/A] Necrotic Tissue: [1:Eschar, Adherent Slough] [N/A:N/A] Exposed Structures: [1:Fascia: No Fat Layer (Subcutaneous Tissue) Exposed:  No Tendon: No Muscle: No Joint: No Bone: No] [N/A:N/A] Epithelialization: [1:None] [N/A:N/A] Periwound Skin Texture: [1:No Abnormalities Noted] [N/A:N/A] Periwound Skin Moisture: [1:Maceration: Yes] [N/A:N/A] Periwound Skin Color: [1:Erythema: Yes] [N/A:N/A] Erythema Location: [1:Circumferential] [N/A:N/A] Temperature: [1:No Abnormality] [N/A:N/A] Tenderness on Palpation: [1:Yes] [N/A:N/A] Wound Preparation: [1:Ulcer Cleansing: Rinsed/Irrigated with Saline] [N/A:N/A] Topical Anesthetic Applied: Other: idocaine 4% Treatment Notes Electronic Signature(s) Signed: 03/11/2017 3:57:11 PM By: Clinton [name] Entered By: Clinton [name] on 03/11/2017 11:27:24 Clinton [name], Clinton CMarland Kitchen ([id number]) -------------------------------------------------------------------------------- [place] Details Patient Name: Clinton [name], [name] Date of Service:  03/11/2017 10:30 AM Medical Record Number: [id number] Patient Account Number: [id number] Date of Birth/Sex: July 03, 1923 (83 y.o. Male) Treating RN: Carolyne Fiscal, Debi Primary Care Ishi Danser: Clinton [name] Other Clinician: Referring Abhimanyu Cruces: Clinton [name] Treating Nayla Dias/Extender: Clinton [name], Clinton [name] Weeks in Treatment: 0 Active Inactive ` Abuse / Safety / Falls / Self Care Management Nursing Diagnoses: Potential for falls Goals: Patient will not experience any injury related to falls Date Initiated: 03/11/2017 Target Resolution Date: 06/25/2017 Goal Status: Active Patient will remain injury free related to falls Date Initiated: 03/11/2017 Target Resolution Date: 05/21/2017 Goal Status: Active Interventions: Assess Activities of Daily Living upon admission and as needed Assess fall risk on admission and as needed Assess: immobility, friction, shearing, incontinence upon admission and as needed Assess impairment of mobility on admission and as needed per policy Assess personal safety and home safety (as indicated) on admission and as needed Notes: ` Nutrition Nursing Diagnoses: Imbalanced nutrition Impaired glucose control: actual or potential Potential for alteratiion in Nutrition/Potential for imbalanced nutrition Goals: Patient/caregiver agrees to and verbalizes understanding of need to use nutritional supplements and/or vitamins as prescribed Date Initiated: 03/11/2017 Target Resolution Date: 06/25/2017 Goal Status: Active Patient/caregiver verbalizes understanding of need to maintain therapeutic glucose control per primary care physician Date Initiated: 03/11/2017 Target Resolution Date: 05/21/2017 Goal Status: Active Interventions: Assess patient nutrition upon admission and as needed per policy Provide education on elevated blood sugars and impact on wound healing Clinton [name], [name] ([id number]) Notes: ` Orientation to the Wound Care Program Nursing Diagnoses: Knowledge  deficit related to the wound healing center program Goals: Patient/caregiver will verbalize understanding of the [facility] Date Initiated: 03/11/2017 Target Resolution Date: 03/26/2017 Goal Status: Active Interventions: Provide education on orientation to the wound center Notes: ` Pain, Acute or Chronic Nursing Diagnoses: Pain, acute or chronic: actual or potential Potential alteration in comfort, pain Goals: Patient/caregiver will verbalize adequate pain control between visits Date Initiated: 03/11/2017 Target Resolution Date: 06/25/2017 Goal Status: Active Interventions: Complete pain assessment as per visit requirements Notes: ` Pressure Nursing Diagnoses: Knowledge deficit related to causes and risk factors for pressure ulcer development Knowledge deficit related to management of pressures ulcers Potential for impaired tissue integrity related to pressure, friction, moisture, and shear Goals: Patient will remain free from development of additional pressure ulcers Date Initiated: 03/11/2017 Target Resolution Date: 06/25/2017 Goal Status: Active Interventions: Assess: immobility, friction, shearing, incontinence upon admission and as needed Assess offloading mechanisms upon admission and as needed Assess potential for pressure ulcer upon admission and as needed Provide education on pressure ulcers Culton, [name] ([id number]) Notes: ` Wound/Skin Impairment Nursing Diagnoses: Impaired tissue integrity Knowledge deficit related to ulceration/compromised skin integrity Goals: Ulcer/skin breakdown will have a volume reduction of 80% by week 12 Date Initiated: 03/11/2017 Target Resolution Date: 06/18/2017 Goal Status: Active Interventions: Assess patient/caregiver ability to perform ulcer/skin care regimen upon admission and as needed Assess ulceration(s) every visit Notes: Electronic Signature(s) Signed: 03/11/2017 3:57:11 PM  By: Clinton [name] Entered By: Clinton [name] on 03/11/2017 11:27:02 Clinton [name], Tarlton. ([id number]) -------------------------------------------------------------------------------- Pain Assessment Details Patient Name: Clinton [name], [name] Date of Service: 03/11/2017 10:30 AM Medical Record Number: [id number] Patient Account Number: [id number] Date of Birth/Sex: 03/31/1924 (83 y.o. Male) Treating RN: Clinton [name] Primary Care Ziyon Cedotal: Clinton [name] Other Clinician: Referring Curties Conigliaro: Clinton [name] Treating Malaia Buchta/Extender: Clinton [name], Clinton [name] Weeks in Treatment: 0 Active Problems Location of Pain Severity and Description of Pain Patient Has Paino No Site Locations Pain Management and Medication Current Pain Management: Electronic Signature(s) Signed: 03/16/2017 11:39:36 AM By: Clinton [name] Previous Signature: 03/11/2017 3:57:11 PM Version By: Clinton [name] Entered By: Clinton [name] on 03/16/2017 11:39:36 Blumenstock, Enmanuel C. ([id number]) -------------------------------------------------------------------------------- Patient/Caregiver Education Details Patient Name: FAILS, Troye C. Date of Service: 03/11/2017 10:30 AM Medical Record Number: [id number] Patient Account Number: [id number] Date of Birth/Gender: Jan 18, 1924 (83 y.o. Male) Treating RN: Clinton [name] Primary Care Physician: Clinton [name] Other Clinician: Referring Physician: Gaye [name] Treating Physician/Extender: Clinton [name], Clinton [name] Weeks in Treatment: 0 Education Assessment Education Provided To: Caregiver wife Education Topics Provided Elevated Blood Sugar/ Impact on Healing: Handouts: Elevated Blood Sugars: How Do They Affect Wound Healing Methods: Explain/Verbal Responses: State content correctly Nutrition: Handouts: Elevated Blood Sugars: How Do They Affect Wound Healing, Nutrition Methods: Explain/Verbal Responses: State content correctly Offloading: Handouts: What is Offloadingo Methods:  Explain/Verbal Responses: State content correctly Pressure: Handouts: Pressure Ulcers: Care and Offloading Methods: Explain/Verbal Responses: State content correctly Welcome [facility] [place]: Handouts: Welcome To [facility] [place] Methods: Explain/Verbal Responses: State content correctly Wound/Skin Impairment: Handouts: Other: change dressing as ordered Methods: Demonstration, Explain/Verbal Responses: State content correctly Electronic Signature(s) Signed: 03/11/2017 3:57:11 PM By: Clinton [name] Entered By: Clinton [name] on 03/11/2017 11:28:39 [place]ade, [place]. ([id number]) -------------------------------------------------------------------------------- Wound Assessment Details Patient Name: CABAN, Benz C. Date of Service: 03/11/2017 10:30 AM Medical Record Number: [id number] Patient Account Number: [id number] Date of Birth/Sex: Sep 06, 1923 (83 y.o. Male) Treating RN: Clinton [name] Primary Care Payden Bonus: Clinton [name] Other Clinician: Referring Clarrissa Shimkus: Clinton [name] Treating Itha Kroeker/Extender: Clinton [name], Clinton [name] Weeks in Treatment: 0 Wound Status Wound Number: 1 Primary Etiology: Pressure Ulcer Wound Location: Left Ischial Tuberosity Wound Status: Open Wounding Event: Pressure Injury Comorbid History: Cataracts, Asthma, Type II Diabetes Date Acquired: 03/04/2017 Weeks Of Treatment: 0 Clustered Wound: No Photos Photo Uploaded By: Clinton [name] on 03/11/2017 16:07:45 Wound Measurements Length: (cm) 2.9 Width: (cm) 3.4 Depth: (cm) 0.1 Area: (cm) 7.744 Volume: (cm) 0.774 % Reduction in Area: 0% % Reduction in Volume: 0% Epithelialization: None Tunneling: No Undermining: No Wound Description Classification: Category/Stage [name] Wound Margin: Distinct, outline attached Exudate Amount: Large Exudate Type: Serosanguineous Exudate Color: red, Bauch Foul Odor After Cleansing: No Slough/Fibrino Yes Wound Bed Granulation Amount: Small (1-33%)  Exposed Structure Granulation Quality: Red Fascia Exposed: No Necrotic Amount: Large (67-100%) Fat Layer (Subcutaneous Tissue) Exposed: No Necrotic Quality: Eschar, Adherent Slough Tendon Exposed: No Muscle Exposed: No Joint Exposed: No Bone Exposed: No Periwound Skin Texture Simpson, Reice C. ([id number]) Texture Color No Abnormalities Noted: No No Abnormalities Noted: No Excoriation: Yes Erythema: Yes Erythema Location: Circumferential Moisture No Abnormalities Noted: No Temperature / Pain Maceration: Yes Temperature: No Abnormality Tenderness on Palpation: Yes Wound Preparation Ulcer Cleansing: Rinsed/Irrigated with Saline Topical Anesthetic Applied: Other: idocaine 4%, Treatment Notes Wound #1 (Left Ischial Tuberosity) 1. Cleansed with: Clean wound with Normal Saline 2. Anesthetic Topical Lidocaine 4% cream to wound bed prior to debridement 3. Peri-wound Care: Skin Prep 4. Dressing Applied: Other dressing (specify in  notes) 5. Secondary Dressing Applied Bordered Foam Dressing Dry Gauze Notes silvercel Electronic Signature(s) Signed: 03/11/2017 11:51:53 AM By: Clinton [name] Entered By: Clinton [name] on 03/11/2017 11:51:53 Saxer, Wesson C. ([id number]) -------------------------------------------------------------------------------- Vitals Details Patient Name: ERHARD, Parmvir C. Date of Service: 03/11/2017 10:30 AM Medical Record Number: [id number] Patient Account Number: [id number] Date of Birth/Sex: 1924/02/27 (83 y.o. Male) Treating RN: Clinton [name] Primary Care Cristopher Ciccarelli: Clinton [name] Other Clinician: Referring Eliya Bubar: Clinton [name] Treating Izyan Ezzell/Extender: Clinton [name], Clinton [name] Weeks in Treatment: 0 Vital Signs Time Taken: 10:56 Temperature (F): 98.2 Height (in): 61 Pulse (bpm): 101 Source: Stated Respiratory Rate (breaths/min): 16 Weight (lbs): 98 Blood Pressure (mmHg): 132/74 Source: Stated Reference Range: 80 - 120 mg / dl Body Mass  Index (BMI): 18.5 Electronic Signature(s) Signed: 03/11/2017 3:57:11 PM By: Clinton [name] Entered By: Clinton [name] on 03/11/2017 10:57:29

## 2017-03-16 ENCOUNTER — Emergency Department (HOSPITAL_COMMUNITY)
Admission: EM | Admit: 2017-03-16 | Discharge: 2017-03-16 | Disposition: A | Discharge status: Home / Self Care | Payer: Medicare Other | Attending: Emergency Medicine | Admitting: Emergency Medicine

## 2017-03-16 ENCOUNTER — Emergency Department (HOSPITAL_COMMUNITY): Payer: Medicare Other

## 2017-03-16 ENCOUNTER — Encounter (HOSPITAL_COMMUNITY): Payer: Self-pay

## 2017-03-16 DIAGNOSIS — R531 Weakness: Secondary | ICD-10-CM | POA: Diagnosis present

## 2017-03-16 DIAGNOSIS — L89229 Pressure ulcer of left hip, unspecified stage: Secondary | ICD-10-CM | POA: Insufficient documentation

## 2017-03-16 DIAGNOSIS — E119 Type 2 diabetes mellitus without complications: Secondary | ICD-10-CM | POA: Insufficient documentation

## 2017-03-16 DIAGNOSIS — Z7982 Long term (current) use of aspirin: Secondary | ICD-10-CM | POA: Diagnosis not present

## 2017-03-16 LAB — CBC WITH DIFFERENTIAL/PLATELET
BASOS ABS: 0.1 10*3/uL (ref 0.0–0.1)
Basophils Relative: 1 %
EOS ABS: 0 10*3/uL (ref 0.0–0.7)
EOS PCT: 0 %
HCT: 39.1 % (ref 39.0–52.0)
HEMOGLOBIN: 12.4 g/dL — AB (ref 13.0–17.0)
LYMPHS PCT: 11 %
Lymphs Abs: 1.4 10*3/uL (ref 0.7–4.0)
MCH: 29.7 pg (ref 26.0–34.0)
MCHC: 31.7 g/dL (ref 30.0–36.0)
MCV: 93.8 fL (ref 78.0–100.0)
Monocytes Absolute: 1.3 10*3/uL — ABNORMAL HIGH (ref 0.1–1.0)
Monocytes Relative: 10 %
NEUTROS PCT: 78 %
Neutro Abs: 10.4 10*3/uL — ABNORMAL HIGH (ref 1.7–7.7)
PLATELETS: 261 10*3/uL (ref 150–400)
RBC: 4.17 MIL/uL — AB (ref 4.22–5.81)
RDW: 13.3 % (ref 11.5–15.5)
WBC: 13.3 10*3/uL — AB (ref 4.0–10.5)

## 2017-03-16 LAB — COMPREHENSIVE METABOLIC PANEL
ALBUMIN: 2.6 g/dL — AB (ref 3.5–5.0)
ALK PHOS: 62 U/L (ref 38–126)
ALT: 16 U/L — AB (ref 17–63)
AST: 29 U/L (ref 15–41)
Anion gap: 6 (ref 5–15)
BUN: 24 mg/dL — AB (ref 6–20)
CALCIUM: 9.5 mg/dL (ref 8.9–10.3)
CHLORIDE: 110 mmol/L (ref 101–111)
CO2: 27 mmol/L (ref 22–32)
CREATININE: 1.21 mg/dL (ref 0.61–1.24)
GFR calc Af Amer: 58 mL/min — ABNORMAL LOW (ref >60)
GFR calc non Af Amer: 50 mL/min — ABNORMAL LOW (ref >60)
GLUCOSE: 132 mg/dL — AB (ref 65–99)
Potassium: 4.4 mmol/L (ref 3.5–5.1)
SODIUM: 143 mmol/L (ref 135–145)
Total Bilirubin: 0.5 mg/dL (ref 0.3–1.2)
Total Protein: 7.2 g/dL (ref 6.5–8.1)

## 2017-03-16 LAB — URINALYSIS, ROUTINE W REFLEX MICROSCOPIC
BILIRUBIN URINE: NEGATIVE
Glucose, UA: NEGATIVE mg/dL
HGB URINE DIPSTICK: NEGATIVE
KETONES UR: NEGATIVE mg/dL
Nitrite: NEGATIVE
Protein, ur: NEGATIVE mg/dL
SPECIFIC GRAVITY, URINE: 1.021 (ref 1.005–1.030)
SQUAMOUS EPITHELIAL / LPF: NONE SEEN
pH: 5 (ref 5.0–8.0)

## 2017-03-16 LAB — LIPASE, BLOOD: LIPASE: 29 U/L (ref 11–51)

## 2017-03-16 LAB — I-STAT CG4 LACTIC ACID, ED
LACTIC ACID, VENOUS: 1.36 mmol/L (ref 0.5–1.9)
Lactic Acid, Venous: 1.55 mmol/L (ref 0.5–1.9)

## 2017-03-16 MED ORDER — CEPHALEXIN 500 MG PO CAPS: 500.0000 mg | ORAL_CAPSULE | Freq: Three times a day (TID) | ORAL | 0 refills | Status: DC

## 2017-03-16 MED ORDER — SODIUM CHLORIDE 0.9 % IV BOLUS (SEPSIS)
500.0000 mL | Freq: Once | INTRAVENOUS | Status: AC
  Administered 2017-03-16: 500 mL via INTRAVENOUS

## 2017-03-16 NOTE — Discharge Instructions (Signed)
Stop sulfa antibiotic.  Take Keflex as prescribed until completed Recheck with your doctor in the next 2-5 days Return if your worse at any time especially fever, vomiting, or worsening signs of infection over the right hip ulcer

## 2017-03-16 NOTE — ED Notes (Signed)
Family at bedside. 

## 2017-03-16 NOTE — ED Triage Notes (Addendum)
Per [facility], PT is coming from home with complaints of sacral wound on his left buttocks for one week. Pt was seen at the wound center and he was given Sulfa. Pt has only taken three pills since he has been prescribed. Last night, acting more fatigued that normal. Family reports diaphoresis when waking up and flushed cheeks. Pt was unable to use his walker this morning due to fatigue. PCP advised ED for r/o allergic reaction. Hx of dementia.   Vitals per EMS: 164/100, 86 HR, 95% 24 RR, 189 CBG, RBBB EKG

## 2017-03-16 NOTE — ED Provider Notes (Signed)
83 y.o. Male with left hip decubitus ulcer being treated by his pmd for 3 days with bactrim.  Fatigue and weakness and told to come in to ED for probable allergic reaction.  Dr. Sherry Ruffing saw and evaluated and plans d/c on keflex.  No evidence of acute allergic rx, or sepsis, wound appears stable.  Plan d/c on keflex after urine obtained.    Pattricia Boss, MD 03/16/17 [phone]

## 2017-03-16 NOTE — ED Provider Notes (Signed)
[place] EMERGENCY DEPARTMENT Provider Note   CSN: [id number] Arrival date & time: 03/16/17  1257     History   Chief Complaint Chief Complaint  Patient presents with  . Weakness    HPI Clinton [name] is a 83 y.o. male.  The history is provided by the patient and medical records. No language interpreter was used.  Illness  This is a new problem. The current episode started more than 2 days ago. The problem occurs constantly. The problem has not changed since onset.Pertinent negatives include no chest pain, no abdominal pain, no headaches and no shortness of breath. Associated symptoms comments: Fatigue. Nothing aggravates the symptoms. Nothing relieves the symptoms. He has tried nothing for the symptoms. The treatment provided no relief.    Past Medical History:  Diagnosis Date  . Diabetes mellitus without complication ([place])   . Hypertension   . Stroke [facility])     Patient Active Problem List   Diagnosis Date Noted  . Aphasia as late effect of cerebrovascular accident 08/18/2016  . Dysarthria 07/13/2016  . Aspiration pneumonia ([place]) 07/13/2016  . Leukocytosis 07/13/2016  . HLD (hyperlipidemia) 07/13/2016  . Ischemic stroke ([place])   . Recurrent aspiration bronchitis/pneumonia ([place])   . 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 Diabetes mellitus type 2 in nonobese ([place]) 03/04/2014  . Dysphagia 03/04/2014  . CVA (cerebral vascular accident) ([place]) 11/26/2013  . CVA (cerebral infarction) 10/15/2013    Past Surgical History:  Procedure Laterality Date  . CARDIAC CATHETERIZATION    . EYE SURGERY         Home Medications    Prior to Admission medications   Medication Sig Start Date End Date Taking? Authorizing Provider  aspirin EC 81 MG tablet Take 81 mg by mouth daily.    [provider]  atorvastatin (LIPITOR) 10 MG tablet TAKE 1 TABLET BY MOUTH DAILY. 10/01/14   Rosalin Hawking, MD  clopidogrel (PLAVIX) 75  MG tablet TAKE 1 TABLET BY MOUTH ONCE A DAY 04/01/15   Rosalin Hawking, MD  famotidine (PEPCID) 20 MG tablet Take 1 tablet (20 mg total) by mouth daily. 07/16/16   Ghimire, Henreitta Leber, MD  Nutritional Supplements (NUTRITIONAL SUPPLEMENT PO) Take by mouth. House 2.0 - Med Pass 120 cc by mouth 2 times a day    [provider]    Family History Family History  Problem Relation Age of Onset  . Cancer Mother     Social History Social History  Substance Use Topics  . Smoking status: Never Smoker  . Smokeless tobacco: Never Used  . Alcohol use No     Comment: 09/03/14 one glass of wine weekly     Allergies   Patient has no known allergies.   Review of Systems Review of Systems  Constitutional: Positive for diaphoresis and fatigue. Negative for chills and fever.  HENT: Negative for congestion and rhinorrhea.   Respiratory: Negative for cough, chest tightness, shortness of breath, wheezing and stridor.   Cardiovascular: Negative for chest pain and palpitations.  Gastrointestinal: Negative for abdominal pain, constipation, diarrhea, nausea and vomiting.  Genitourinary: Negative for dysuria, enuresis and frequency.  Musculoskeletal: Negative for back pain, neck pain and neck stiffness.  Skin: Positive for rash and wound.  Neurological: Negative for weakness, light-headedness and headaches.  Psychiatric/Behavioral: Negative for agitation and confusion.  All other systems reviewed and are negative.    Physical Exam Updated Vital Signs Wt 47.6 kg (105 lb)  BMI 16.95 kg/m   Physical Exam  Constitutional: He is oriented to person, place, and time. He appears well-developed and well-nourished. No distress.  HENT:  Head: Normocephalic and atraumatic.  Mouth/Throat: Oropharynx is clear and moist. No oropharyngeal exudate.  Eyes: Pupils are equal, round, and reactive to light. Conjunctivae and EOM are normal.  Neck: Normal range of motion. Neck supple. No tracheal deviation  present.  Cardiovascular: Normal rate and intact distal pulses.   No murmur heard. Pulmonary/Chest: Effort normal and breath sounds normal. No respiratory distress. He has no wheezes. He has no rales. He exhibits no tenderness.  Abdominal: Soft. There is no tenderness. There is no rebound.  Musculoskeletal: He exhibits no edema or tenderness.       Left hip: He exhibits no tenderness and no crepitus.       Legs: Neurological: He is alert and oriented to person, place, and time. No sensory deficit. He exhibits normal muscle tone.  Skin: Skin is warm and dry. Capillary refill takes less than 2 seconds. Rash noted. He is not diaphoretic. There is erythema. No pallor.  Psychiatric: He has a normal mood and affect.  Nursing note and vitals reviewed.    ED Treatments / Results  Labs (all labs ordered are listed, but only abnormal results are displayed) Labs Reviewed  CBC WITH DIFFERENTIAL/PLATELET - Abnormal; Notable for the following:       Result Value   WBC 13.3 (*)    RBC 4.17 (*)    Hemoglobin 12.4 (*)    Neutro Abs 10.4 (*)    Monocytes Absolute 1.3 (*)    All other components within normal limits  COMPREHENSIVE METABOLIC PANEL - Abnormal; Notable for the following:    Glucose, Bld 132 (*)    BUN 24 (*)    Albumin 2.6 (*)    ALT 16 (*)    GFR calc non Af Amer 50 (*)    GFR calc Af Amer 58 (*)    All other components within normal limits  URINE CULTURE  LIPASE, BLOOD  URINALYSIS, ROUTINE W REFLEX MICROSCOPIC  I-STAT CG4 LACTIC ACID, ED  I-STAT CG4 LACTIC ACID, ED    EKG  EKG Interpretation  Date/Time:  Wednesday March 16 2017 13:16:32 EDT Ventricular Rate:  76 PR Interval:    QRS Duration: 133 QT Interval:  438 QTC Calculation: 493 R Axis:   -71 Text Interpretation:  Sinus rhythm Atrial premature complex RBBB and LAFB Baseline wander in lead(s) V1 When compared to prior, no significant changes seen.  No STEMI Confirmed by Antony Blackbird [phone]) on 03/16/2017  1:25:06 PM       Radiology Dg Chest 2 View  Result Date: 03/16/2017 CLINICAL DATA:  Fatigue and diaphoresis EXAM: CHEST  2 VIEW COMPARISON:  July 14, 2016 FINDINGS: There is patchy atelectasis in the lung bases. There is no edema or consolidation. Heart is borderline enlarged with pulmonary vascularity within normal limits. There is aortic atherosclerosis. There is degenerative change in the thoracic spine. There is evidence of old trauma involving the lateral left clavicle with widening of the acromioclavicular joint. There is postoperative change in the left shoulder. There is superior migration of each humeral head. IMPRESSION: Bibasilar atelectasis. No edema or consolidation. Heart prominent with aortic atherosclerosis. Superior migration of each humeral head, a finding indicative of chronic rotator cuff tears bilaterally. Aortic Atherosclerosis (ICD10-I70.0). Electronically Signed   By: Lowella Grip III M.D.   On: 03/16/2017 15:10   Dg Femur Min 2  Views Left  Result Date: 03/16/2017 CLINICAL DATA:  Chills, fatigue.  Left upper leg pain, wound EXAM: LEFT FEMUR 2 VIEWS COMPARISON:  None. FINDINGS: Moderate degenerative changes in the left hip with joint space loss and spurring. No acute bony abnormality. Specifically, no fracture, subluxation, or dislocation. Soft tissues are intact. Knee joint is maintained. No joint effusion within the left knee. IMPRESSION: Mild-to-moderate degenerative changes in the left hip. No acute bony abnormality. Electronically Signed   By: Rolm Baptise M.D.   On: 03/16/2017 15:10    Procedures Procedures (including critical care time)  Medications Ordered in ED Medications  sodium chloride 0.9 % bolus 500 mL (500 mLs Intravenous New Bag/Given 03/16/17 1416)     Initial Impression / Assessment and Plan / ED Course  I have reviewed the triage vital signs and the nursing notes.  Pertinent labs & imaging results that were available during my care of  the patient were reviewed by me and considered in my medical decision making (see chart for details).     Clinton [name] is a 83 y.o. male with a past medical history significant for diabetes, hypertension, prior stroke with residual speech difficulties on aspirin and Plavix, hypertension, and left upper leg wound currently on antibiotics who presents with increasing fatigue, diaphoresis, and flushed face.  Patient is coming by family who reports that for the last week, patient has developed a wound on his left lateral hip.  Patient had some purulence and drainage from it causing him to start Bactrim 3 days ago.  Patient has received several doses of the medication however today he had flushing and redness of his face.  Family called the primary doctor who thought he might be having allergic reaction prompting him to be sent to the ED.  Patient has not any difficulty breathing or any sensation of throat closing.  Patient has chronic difficulty swallowing due to stroke.  She does report that he has been having worsening fatigue over the last several days but is otherwise had no urinary changes, constipation, diarrhea, or other complaints.  Patient denies worsened pain in his left hip.   On exam, patient lungs were clear.  Abdomen was nontender.  Chest was nontender.  Patient had a 3 cm wound on his left lateral hip that was well dressed.  Minimal purulence and mild surrounding erythema.  No crepitance.  No significant pain with hip manipulation.  Normal sensation and pulses distally.  Based on patient's increasing fatigue and diaphoresis, patient will have workup to look for occult infection.  Given the skin wound, patient will have x-ray to look for evidence of osteomyelitis or subcutaneous gas.  Doubt necrotizing fasciitis.  Patient will be given fluids during initial workup.  Patient's diagnostic lab testing began to return.  Lipase not elevated.  Mild leukocytosis of 13.3 and improved anemia to prior.   Lactic acid not elevated.  Metabolic panel showed no significant R abnormalities.  X-ray shows no evidence of pneumonia.  X-ray of the hip shows no evidence of osteomyelitis or subcutaneous infection.   Patient is still awaiting urinalysis.  If urinalysis is reassuring, suspect patient will be stable for discharge home.  Given the flushing and diaphoresis after taking patient may need change to a different antibiotic for his cellulitis of the leg.    Final Clinical Impressions(s) / ED Diagnoses   Final diagnoses:  Pressure injury of skin of left hip, unspecified injury stage  Weakness     Clinical Impression: 1. Pressure  injury of skin of left hip, unspecified injury stage   2. Weakness     Disposition: Discharge  Condition: Good  I have discussed the results, Dx and Tx plan with the pt(& family if present). He/she/they expressed understanding and agree(s) with the plan. Discharge instructions discussed at great length. Strict return precautions discussed and pt &/or family have verbalized understanding of the instructions. No further questions at time of discharge.    Discharge Medication List as of 03/16/2017  6:02 PM    START taking these medications   Details  cephALEXin (KEFLEX) 500 MG capsule Take 1 capsule (500 mg total) by mouth 3 (three) times daily., Starting Wed 03/16/2017, Print        Follow Up: No follow-up provider specified.    Cliford Sequeira, Gwenyth Allegra, MD 03/16/17 2119

## 2017-03-17 LAB — URINE CULTURE: Culture: NO GROWTH

## 2017-03-18 ENCOUNTER — Encounter: Payer: Medicare Other | Attending: Surgery | Admitting: Surgery

## 2017-03-18 DIAGNOSIS — J45909 Unspecified asthma, uncomplicated: Secondary | ICD-10-CM | POA: Insufficient documentation

## 2017-03-18 DIAGNOSIS — L89223 Pressure ulcer of left hip, stage 3: Secondary | ICD-10-CM | POA: Diagnosis not present

## 2017-03-18 DIAGNOSIS — E11622 Type 2 diabetes mellitus with other skin ulcer: Secondary | ICD-10-CM | POA: Diagnosis present

## 2017-03-18 DIAGNOSIS — Z7984 Long term (current) use of oral hypoglycemic drugs: Secondary | ICD-10-CM | POA: Insufficient documentation

## 2017-03-18 DIAGNOSIS — E440 Moderate protein-calorie malnutrition: Secondary | ICD-10-CM | POA: Diagnosis not present

## 2017-03-18 DIAGNOSIS — I69998 Other sequelae following unspecified cerebrovascular disease: Secondary | ICD-10-CM | POA: Diagnosis not present

## 2017-03-20 NOTE — Progress Notes (Addendum)
ROSE, HIPPLER ([id number]) Visit Report for 03/18/2017 Arrival Information Details Patient Name: Clinton [name], [name] Date of Service: 03/18/2017 12:30 PM Medical Record Number: [id number] Patient Account Number: [id number] Date of Birth/Sex: [birth year] (83 y.o. Male) Treating RN: Montey Hora Primary Care Detavious Rinn: Wenda Low Other Clinician: Referring Angeliki Mates: Wenda Low Treating Merek Niu/Extender: Frann Rider in Treatment: 1 Visit Information History Since Last Visit Added or deleted any medications: No Patient Arrived: [place] Any new allergies or adverse reactions: No Arrival Time: 12:49 Had a fall or experienced change in No Accompanied By: spouse activities of daily living that may affect Transfer Assistance: None risk of falls: Patient Identification Verified: Yes Signs or symptoms of abuse/neglect since last visito No Secondary Verification Process Yes Hospitalized since last visit: No Completed: Has Dressing in Place as Prescribed: Yes Patient Requires Transmission-Based No Pain Present Now: No Precautions: Patient Has Alerts: Yes Patient Alerts: Patient on Blood Thinner Plavix DM II Electronic Signature(s) Signed: 03/18/2017 4:48:58 PM By: Montey Hora Entered By: Montey Hora on 03/18/2017 12:50:07 Kramar, Jordon C. ([id number]) -------------------------------------------------------------------------------- Encounter Discharge Information Details Patient Name: [name], [name] Date of Service: 03/18/2017 12:30 PM Medical Record Number: [id number] Patient Account Number: [id number] Date of Birth/Sex: [birth year] (83 y.o. Male) Treating RN: Montey Hora Primary Care Brelee Renk: Wenda Low Other Clinician: Referring Lashonna Rieke: Wenda Low Treating Tarron Krolak/Extender: Frann Rider in Treatment: 1 Encounter Discharge Information Items Discharge Pain Level: 0 Discharge Condition: Stable Ambulatory Status: Walker Discharge Destination:  Home Transportation: Private Auto Accompanied By: spouse Schedule Follow-up Appointment: Yes Medication Reconciliation completed and No provided to Patient/Care Jevonte Clanton: Provided on Clinical Summary of Care: 03/18/2017 Form Type Recipient Paper Patient DB Electronic Signature(s) Signed: 03/18/2017 1:57:39 PM By: Montey Hora Entered By: Montey Hora on 03/18/2017 13:57:39 Vent, Hercules C. ([id number]) -------------------------------------------------------------------------------- Multi Wound Chart Details Patient Name: [name], [name] Date of Service: 03/18/2017 12:30 PM Medical Record Number: [id number] Patient Account Number: [id number] Date of Birth/Sex: 17-Jan-1924 (83 y.o. Male) Treating RN: Montey Hora Primary Care Audra Bellard: Wenda Low Other Clinician: Referring Nika Yazzie: Wenda Low Treating Latif Nazareno/Extender: Frann Rider in Treatment: 1 Vital Signs Height(in): 61 Pulse(bpm): 28 Weight(lbs): 40 Blood Pressure(mmHg): 102/70 Body Mass Index(BMI): 19 Temperature(F): 97.0 Respiratory Rate 16 (breaths/min): Photos: [1:No Photos] [N/A:N/A] Wound Location: [1:Left Ischial Tuberosity] [N/A:N/A] Wounding Event: [1:Pressure Injury] [N/A:N/A] Primary Etiology: [1:Pressure Ulcer] [N/A:N/A] Comorbid History: [1:Cataracts, Asthma, Type II Diabetes] [N/A:N/A] Date Acquired: [1:03/04/2017] [N/A:N/A] Weeks of Treatment: [1:1] [N/A:N/A] Wound Status: [1:Open] [N/A:N/A] Measurements L x W x D [1:3.3x3.5x0.2] [N/A:N/A] (cm) Area (cm) : [1:9.071] [N/A:N/A] Volume (cm) : [1:1.814] [N/A:N/A] % Reduction in Area: [1:-17.10%] [N/A:N/A] % Reduction in Volume: [1:-134.40%] [N/A:N/A] Classification: [1:Category/Stage III] [N/A:N/A] Exudate Amount: [1:Large] [N/A:N/A] Exudate Type: [1:Purulent] [N/A:N/A] Exudate Color: [1:yellow, [place], green] [N/A:N/A] Wound Margin: [1:Distinct, outline attached] [N/A:N/A] Granulation Amount: [1:Small (1-33%)]  [N/A:N/A] Granulation Quality: [1:Red] [N/A:N/A] Necrotic Amount: [1:Large (67-100%)] [N/A:N/A] Necrotic Tissue: [1:Eschar, Adherent Slough] [N/A:N/A] Exposed Structures: [1:Fascia: No Fat Layer (Subcutaneous Tissue) Exposed: No Tendon: No Muscle: No Joint: No Bone: No] [N/A:N/A] Epithelialization: [1:None] [N/A:N/A] Debridement: [1:Debridement ([id number])] [N/A:N/A] Pre-procedure [1:13:28] [N/A:N/A] Verification/Time Out Taken: Pain Control: [1:Lidocaine 4% Topical Solution] [N/A:N/A] Tissue Debrided: [N/A:N/A] Necrotic/Eschar, Subcutaneous Level: Skin/Subcutaneous Tissue N/A N/A Debridement Area (sq cm): 11.55 N/A N/A Instrument: Blade, Forceps N/A N/A Bleeding: Minimum N/A N/A Hemostasis Achieved: Pressure N/A N/A Procedural Pain: 0 N/A N/A Post Procedural Pain: 0 N/A N/A Debridement Treatment Procedure was tolerated well N/A N/A Response: Post Debridement 3.3x3.5x0.3 N/A N/A Measurements L x W x D (cm) Post  Debridement Volume: 2.721 N/A N/A (cm) Post Debridement Stage: Category/Stage III N/A N/A Periwound Skin Texture: Excoriation: Yes N/A N/A Periwound Skin Moisture: Maceration: Yes N/A N/A Periwound Skin Color: Erythema: Yes N/A N/A Erythema Location: Circumferential N/A N/A Temperature: No Abnormality N/A N/A Tenderness on Palpation: Yes N/A N/A Wound Preparation: Ulcer Cleansing: N/A N/A Rinsed/Irrigated with Saline Topical Anesthetic Applied: Other: idocaine 4% Procedures Performed: Debridement N/A N/A Treatment Notes Wound #1 (Left Ischial Tuberosity) 1. Cleansed with: Clean wound with Normal Saline 2. Anesthetic Topical Lidocaine 4% cream to wound bed prior to debridement 4. Dressing Applied: Santyl Ointment 5. Secondary Dressing Applied Bordered Foam Dressing Dry Gauze Electronic Signature(s) Signed: 03/18/2017 2:04:52 PM By: Christin Fudge MD, FACS Entered By: Christin Fudge on 03/18/2017 14:04:51 [name], Keagen C.  ([id number]) -------------------------------------------------------------------------------- [place] Details Patient Name: [name], [name] Date of Service: 03/18/2017 12:30 PM Medical Record Number: [id number] Patient Account Number: [id number] Date of Birth/Sex: 03/28/1924 (83 y.o. Male) Treating RN: Montey Hora Primary Care Lakima Dona: Wenda Low Other Clinician: Referring Raena Pau: Wenda Low Treating Karigan Cloninger/Extender: Frann Rider in Treatment: 1 Active Inactive Electronic Signature(s) Signed: 04/05/2017 2:56:06 PM By: Gretta Cool, BSN, RN, CWS, Kim RN, BSN Signed: 04/18/2017 12:37:59 PM By: Montey Hora Previous Signature: 03/18/2017 4:48:58 PM Version By: Montey Hora Entered By: Gretta Cool BSN, RN, CWS, Kim on 04/05/2017 14:56:05 Czerwonka, Tyshun C. ([id number]) -------------------------------------------------------------------------------- Pain Assessment Details Patient Name: ELAND, Esequiel C. Date of Service: 03/18/2017 12:30 PM Medical Record Number: [id number] Patient Account Number: [id number] Date of Birth/Sex: 1924/02/06 (83 y.o. Male) Treating RN: Montey Hora Primary Care Zaela Graley: Wenda Low Other Clinician: Referring Rashiya Lofland: Wenda Low Treating Bonne Whack/Extender: Frann Rider in Treatment: 1 Active Problems Location of Pain Severity and Description of Pain Patient Has Paino No Site Locations Pain Management and Medication Current Pain Management: Electronic Signature(s) Signed: 03/18/2017 4:48:58 PM By: Montey Hora Entered By: Montey Hora on 03/18/2017 12:52:25 Mckelvie, Mauri C. ([id number]) -------------------------------------------------------------------------------- Patient/Caregiver Education Details Patient Name: HARNISH, Travian C. Date of Service: 03/18/2017 12:30 PM Medical Record Number: [id number] Patient Account Number: [id number] Date of Birth/Gender: 22-Jan-1924 (83 y.o. Male) Treating RN: Montey Hora Primary Care Physician: Wenda Low Other Clinician: Referring Physician: Wenda Low Treating Physician/Extender: Frann Rider in Treatment: 1 Education Assessment Education Provided To: Patient and Caregiver Education Topics Provided Wound/Skin Impairment: Handouts: Other: wound care and offloading as ordered Methods: Demonstration, Explain/Verbal Responses: State content correctly Electronic Signature(s) Signed: 03/18/2017 4:48:58 PM By: Montey Hora Entered By: Montey Hora on 03/18/2017 13:58:18 [place], [place]. ([id number]) -------------------------------------------------------------------------------- Wound Assessment Details Patient Name: MATSUOKA, Edna C. Date of Service: 03/18/2017 12:30 PM Medical Record Number: [id number] Patient Account Number: [id number] Date of Birth/Sex: 05/09/1924 (83 y.o. Male) Treating RN: Montey Hora Primary Care Omayra Tulloch: Wenda Low Other Clinician: Referring Olon Russ: Wenda Low Treating Avya Flavell/Extender: Frann Rider in Treatment: 1 Wound Status Wound Number: 1 Primary Etiology: Pressure Ulcer Wound Location: Left Ischial Tuberosity Wound Status: Open Wounding Event: Pressure Injury Comorbid History: Cataracts, Asthma, Type II Diabetes Date Acquired: 03/04/2017 Weeks Of Treatment: 1 Clustered Wound: No Photos Photo Uploaded By: Montey Hora on 03/18/2017 16:34:02 Wound Measurements Length: (cm) 3.3 % Redu Width: (cm) 3.5 % Redu Depth: (cm) 0.2 Epithe Area: (cm) 9.071 [place] Volume: (cm) 1.814 Under ction in Area: -17.1% ction in Volume: -134.4% lialization: None ling: No mining: No Wound Description Classification: Category/Stage III Wound Margin: Distinct, outline attached Exudate Amount: Large Exudate Type: Purulent Exudate Color: yellow, Stum, green Foul Odor After Cleansing: No Slough/Fibrino Yes Wound Bed  Granulation Amount: Small (1-33%) Exposed Structure Granulation  Quality: Red Fascia Exposed: No Necrotic Amount: Large (67-100%) Fat Layer (Subcutaneous Tissue) Exposed: No Necrotic Quality: Eschar, Adherent Slough Tendon Exposed: No Muscle Exposed: No Joint Exposed: No Bone Exposed: No Periwound Skin Texture Briseno, Donnis C. ([id number]) Texture Color No Abnormalities Noted: No No Abnormalities Noted: No Excoriation: Yes Erythema: Yes Erythema Location: Circumferential Moisture No Abnormalities Noted: No Temperature / Pain Maceration: Yes Temperature: No Abnormality Tenderness on Palpation: Yes Wound Preparation Ulcer Cleansing: Rinsed/Irrigated with Saline Topical Anesthetic Applied: Other: idocaine 4%, Electronic Signature(s) Signed: 03/18/2017 4:48:58 PM By: Montey Hora Entered By: Montey Hora on 03/18/2017 12:57:39 Lennox, Bradely C. ([id number]) -------------------------------------------------------------------------------- Vitals Details Patient Name: KORAL, Amel C. Date of Service: 03/18/2017 12:30 PM Medical Record Number: [id number] Patient Account Number: [id number] Date of Birth/Sex: 1924/01/22 (83 y.o. Male) Treating RN: Montey Hora Primary Care Teisha Trowbridge: Wenda Low Other Clinician: Referring Tiffanni Scarfo: Wenda Low Treating Tylar Amborn/Extender: Frann Rider in Treatment: 1 Vital Signs Time Taken: 12:50 Temperature (F): 97.0 Height (in): 61 Pulse (bpm): 66 Weight (lbs): 98 Respiratory Rate (breaths/min): 16 Body Mass Index (BMI): 18.5 Blood Pressure (mmHg): 102/70 Reference Range: 80 - 120 mg / dl Electronic Signature(s) Signed: 03/18/2017 4:48:58 PM By: Montey Hora Entered By: Montey Hora on 03/18/2017 12:52:07

## 2017-03-20 NOTE — Progress Notes (Addendum)
Clinton [name] ([id number]) Visit Report for 03/18/2017 Chief Complaint Document Details Patient Name: [name], [name] Date of Service: 03/18/2017 12:30 PM Medical Record Number: [id number] Patient Account Number: [id number] Date of Birth/Sex: 01/08/1924 (83 y.o. Male) Treating RN: Clinton [name] Primary Care Provider: Wenda [name] Other Clinician: Referring Provider: Wenda [name] Treating Provider/Extender: Clinton [name] in Treatment: 1 Information Obtained from: Patient Chief Complaint Left Ischial Tuberosity Ulcer Electronic Signature(s) Signed: 03/18/2017 2:05:12 PM By: Clinton Fudge MD, FACS Entered By: Clinton [name] on 03/18/2017 14:05:12 [place], [name] ([id number]) -------------------------------------------------------------------------------- Debridement Details Patient Name: [name], [name] Date of Service: 03/18/2017 12:30 PM Medical Record Number: [id number] Patient Account Number: [id number] Date of Birth/Sex: 10/19/1923 (83 y.o. Male) Treating RN: Clinton [name] Primary Care Provider: Wenda [name] Other Clinician: Referring Provider: Wenda [name] Treating Provider/Extender: Clinton [name] in Treatment: 1 Debridement Performed for Wound #1 Left Ischial Tuberosity Assessment: Performed By: Physician Clinton Fudge, MD Debridement: Debridement Pre-procedure Verification/Time Yes - 13:28 Out Taken: Start Time: 13:28 Pain Control: Lidocaine 4% Topical Solution Level: Skin/Subcutaneous Tissue Total Area Debrided (L x W): 3.3 (cm) x 3.5 (cm) = 11.55 (cm) Tissue and other material Viable, Non-Viable, Eschar, Subcutaneous debrided: Instrument: Blade, Forceps Bleeding: Minimum Hemostasis Achieved: Pressure End Time: 13:31 Procedural Pain: 0 Post Procedural Pain: 0 Response to Treatment: Procedure was tolerated well Post Debridement Measurements of Total Wound Length: (cm) 3.3 Stage: Category/Stage III Width: (cm) 3.5 Depth: (cm) 0.3 Volume:  (cm) 2.721 Character of Wound/Ulcer Post Improved Debridement: Post Procedure Diagnosis Same as Pre-procedure Electronic Signature(s) Signed: 03/18/2017 2:05:02 PM By: Clinton Fudge MD, FACS Signed: 03/18/2017 4:48:58 PM By: Clinton [name] Entered By: Clinton [name] on 03/18/2017 14:05:02 [name], [name] ([id number]) -------------------------------------------------------------------------------- HPI Details Patient Name: [name], [name] Date of Service: 03/18/2017 12:30 PM Medical Record Number: [id number] Patient Account Number: [id number] Date of Birth/Sex: 07-20-1923 (83 y.o. Male) Treating RN: Clinton [name] Primary Care Provider: Wenda [name] Other Clinician: Referring Provider: Wenda [name] Treating Provider/Extender: Clinton [name] in Treatment: 1 History of Present Illness HPI Description: 03/11/17 patient presents today for evaluation concerning an ulcer which was initially noted according to records reviewed on 03/06/17. Unfortunately it appears that it has progressed really rapidly according to patient's wife who was present during the evaluation today. He is a stroke patient and in fact has had multiple CVA's with the last one being February of this year 2018. With that being said this particular ulceration in the left ischial tuberosity location appears to be causing him a significant amount of discomfort. It also seems to be worsening and appears infected with surrounding erythema noted at this point. No fevers, chills, nausea, or vomiting noted at this time. Patient does have diabetes which is stated to be controlled based on notes again reviewed. Unfortunately secondary to mental status is unable to rate or describe his pain. 03/18/2017 -- patient had a culture of the left ischial tuberosity which showed abundant MRSA which was sensitive to clindamycin, doxycycline and Bactrim I'm seeing the patient for the first time and note that he is a debilitated elderly  gentleman who is ambulating with a walker and has been seen in the ER recently, after he had taken a few days of Bactrim.x-ray of the chest showed by basilar atelectasis with no evidence of pneumonia and x-ray of the left hip showed degenerative changes and no evidence of bony abnormality.past medical history significant for diabetes mellitus, hypertension, prior stroke with residual speech difficulties and hypertension and the wound on the left  hip. with the question of whether he was having a reaction to the Bactrim he was changed to Keflex in. Electronic Signature(s) Signed: 03/18/2017 2:08:29 PM By: Clinton Fudge MD, FACS Previous Signature: 03/18/2017 12:53:24 PM Version By: Clinton Fudge MD, FACS Entered By: Clinton [name] on 03/18/2017 14:08:29 [name], [name] ([id number]) -------------------------------------------------------------------------------- Physical Exam Details Patient Name: [name], [name] Date of Service: 03/18/2017 12:30 PM Medical Record Number: [id number] Patient Account Number: [id number] Date of Birth/Sex: Dec 18, 1923 (83 y.o. Male) Treating RN: Clinton [name] Primary Care Provider: Wenda [name] Other Clinician: Referring Provider: Wenda [name] Treating Provider/Extender: Clinton [name] in Treatment: 1 Constitutional . Pulse regular. Respirations normal and unlabored. Afebrile. . Eyes Nonicteric. Reactive to light. Ears, Nose, Mouth, and Throat Lips, teeth, and gums WNL.Marland Kitchen Moist mucosa without lesions. Neck supple and nontender. No palpable supraclavicular or cervical adenopathy. Normal sized without goiter. Respiratory WNL. No retractions.. Cardiovascular Pedal Pulses WNL. No clubbing, cyanosis or edema. Lymphatic No adneopathy. No adenopathy. No adenopathy. Musculoskeletal Adexa without tenderness or enlargement.. Digits and nails w/o clubbing, cyanosis, infection, petechiae, ischemia, or inflammatory conditions.. Integumentary (Hair, Skin) No  suspicious lesions. No crepitus or fluctuance. No peri-wound warmth or erythema. No masses.Marland Kitchen Psychiatric Judgement and insight Intact.. No evidence of depression, anxiety, or agitation.. Notes sharp debridement was done with a forcep and 15 number blade and a lot of necrotic tissue was removed and I had to stop because the patient was having significant tenderness. Electronic Signature(s) Signed: 03/18/2017 2:09:21 PM By: Clinton Fudge MD, FACS Entered By: Clinton [name] on 03/18/2017 14:09:20 [name], [name] ([id number]) -------------------------------------------------------------------------------- Physician Orders Details Patient Name: [name], [name] Date of Service: 03/18/2017 12:30 PM Medical Record Number: [id number] Patient Account Number: [id number] Date of Birth/Sex: 01-01-1924 (83 y.o. Male) Treating RN: Clinton [name] Primary Care Provider: Wenda [name] Other Clinician: Referring Provider: Wenda [name] Treating Provider/Extender: Clinton [name] in Treatment: 1 Verbal / Phone Orders: No Diagnosis Coding Wound Cleansing Wound #1 Left Ischial Tuberosity o Clean wound with Normal Saline. o Cleanse wound with mild soap and water o May Shower, gently pat wound dry prior to applying new dressing. Anesthetic Wound #1 Left Ischial Tuberosity o Topical Lidocaine 4% cream applied to wound bed prior to debridement Skin Barriers/Peri-Wound Care Wound #1 Left Ischial Tuberosity o Skin Prep Primary Wound Dressing Wound #1 Left Ischial Tuberosity o Santyl Ointment Secondary Dressing Wound #1 Left Ischial Tuberosity o Dry Gauze o Boardered Foam Dressing Dressing Change Frequency Wound #1 Left Ischial Tuberosity o Change dressing every day. o Other: - as needed Follow-up Appointments Wound #1 Left Ischial Tuberosity o Return Appointment in 1 week. Off-Loading Wound #1 Left Ischial Tuberosity o Mattress - order support surface for patient  bed o Turn and reposition every 2 hours Additional Orders / Instructions Wound #1 Left Ischial Tuberosity o Increase protein intake. [name], Clinton ([id number]) Home Health Wound #1 Left Ischial [place] for [place] Nurse may visit PRN to address patientos wound care needs. o FACE TO FACE ENCOUNTER: MEDICARE and MEDICAID PATIENTS: I certify that this patient is under my care and that I had a face-to-face encounter that meets the physician face-to-face encounter requirements with this patient on this date. The encounter with the patient was in whole or in part for the following MEDICAL CONDITION: (primary reason for [place]) MEDICAL NECESSITY: I certify, that based on my findings, NURSING services are a medically necessary home health service. HOME BOUND STATUS: I certify that my  clinical findings support that this patient is homebound (i.e., Due to illness or injury, pt requires aid of supportive devices such as crutches, cane, wheelchairs, walkers, the use of special transportation or the assistance of another person to leave their place of residence. There is a normal inability to leave the home and doing so requires considerable and taxing effort. Other absences are for medical reasons / religious services and are infrequent or of short duration when for other reasons). o If current dressing causes regression in wound condition, may D/C ordered dressing product/s and apply Normal Saline Moist Dressing daily until next [place] / Other MD appointment. [place] of regression in wound condition at [phone]. o Please direct any NON-WOUND related issues/requests for orders to patient's Primary Care Physician Medications-please add to medication list. o P.O. Antibiotics - start antibiotic o Santyl Enzymatic Ointment o Other: - Vitamin C, Zinc, MVI Patient Medications Allergies:  NKDA Notifications Medication Indication Start End Santyl 03/18/2017 DOSE topical 250 unit/gram ointment - ointment topical as directed Electronic Signature(s) Signed: 03/18/2017 1:41:30 PM By: Clinton Fudge MD, FACS Entered By: Clinton [name] on 03/18/2017 13:41:30 Pelcher, Hamzah C. ([id number]) -------------------------------------------------------------------------------- Problem List Details Patient Name: GEIMER, Decker C. Date of Service: 03/18/2017 12:30 PM Medical Record Number: [id number] Patient Account Number: [id number] Date of Birth/Sex: 03-16-1924 (83 y.o. Male) Treating RN: Clinton [name] Primary Care Provider: Wenda [name] Other Clinician: Referring Provider: Wenda [name] Treating Provider/Extender: Clinton [name] in Treatment: 1 Active Problems ICD-10 Encounter Code Description Active Date Diagnosis E11.622 Type 2 diabetes mellitus with other skin ulcer 03/11/2017 Yes L89.223 Pressure ulcer of left hip, stage 3 03/11/2017 Yes I69.998 Other sequelae following unspecified cerebrovascular disease 03/11/2017 Yes E44.0 Moderate protein-calorie malnutrition 03/18/2017 Yes Inactive Problems Resolved Problems Electronic Signature(s) Signed: 03/18/2017 2:04:47 PM By: Clinton Fudge MD, FACS Entered By: Clinton [name] on 03/18/2017 14:04:46 Haskell, [place]. ([id number]) -------------------------------------------------------------------------------- Progress Note Details Patient Name: MUNYON, Kaoru C. Date of Service: 03/18/2017 12:30 PM Medical Record Number: [id number] Patient Account Number: [id number] Date of Birth/Sex: May 25, 1923 (83 y.o. Male) Treating RN: Clinton [name] Primary Care Provider: Wenda [name] Other Clinician: Referring Provider: Wenda [name] Treating Provider/Extender: Clinton [name] in Treatment: 1 Subjective Chief Complaint Information obtained from Patient Left Ischial Tuberosity Ulcer History of Present Illness (HPI) 03/11/17 patient  presents today for evaluation concerning an ulcer which was initially noted according to records reviewed on 03/06/17. Unfortunately it appears that it has progressed really rapidly according to patient's wife who was present during the evaluation today. He is a stroke patient and in fact has had multiple CVA's with the last one being February of this year 2018. With that being said this particular ulceration in the left ischial tuberosity location appears to be causing him a significant amount of discomfort. It also seems to be worsening and appears infected with surrounding erythema noted at this point. No fevers, chills, nausea, or vomiting noted at this time. Patient does have diabetes which is stated to be controlled based on notes again reviewed. Unfortunately secondary to mental status is unable to rate or describe his pain. 03/18/2017 -- patient had a culture of the left ischial tuberosity which showed abundant MRSA which was sensitive to clindamycin, doxycycline and Bactrim I'm seeing the patient for the first time and note that he is a debilitated elderly gentleman who is ambulating with a walker and has been seen in the ER recently, after he had taken a few days of Bactrim.x-ray of the chest showed  by basilar atelectasis with no evidence of pneumonia and x-ray of the left hip showed degenerative changes and no evidence of bony abnormality.past medical history significant for diabetes mellitus, hypertension, prior stroke with residual speech difficulties and hypertension and the wound on the left hip. with the question of whether he was having a reaction to the Bactrim he was changed to Keflex in. Patient History Unable to Obtain Patient History due to Aphasia. Information obtained from Patient. Social History Never smoker, Marital Status - Married, Alcohol Use - Never, Drug Use - No History, Caffeine Use - Daily. Objective Constitutional Pulse regular. Respirations normal and  unlabored. Afebrile. [name], [name] ([id number]) Vitals Time Taken: 12:50 PM, Height: 61 in, Weight: 98 lbs, BMI: 18.5, Temperature: 97.0 F, Pulse: 66 bpm, Respiratory Rate: 16 breaths/min, Blood Pressure: 102/70 mmHg. Eyes Nonicteric. Reactive to light. Ears, Nose, Mouth, and Throat Lips, teeth, and gums WNL.Marland Kitchen Moist mucosa without lesions. Neck supple and nontender. No palpable supraclavicular or cervical adenopathy. Normal sized without goiter. Respiratory WNL. No retractions.. Cardiovascular Pedal Pulses WNL. No clubbing, cyanosis or edema. Lymphatic No adneopathy. No adenopathy. No adenopathy. Musculoskeletal Adexa without tenderness or enlargement.. Digits and nails w/o clubbing, cyanosis, infection, petechiae, ischemia, or inflammatory conditions.Marland Kitchen Psychiatric Judgement and insight Intact.. No evidence of depression, anxiety, or agitation.. General Notes: sharp debridement was done with a forcep and 15 number blade and a lot of necrotic tissue was removed and I had to stop because the patient was having significant tenderness. Integumentary (Hair, Skin) No suspicious lesions. No crepitus or fluctuance. No peri-wound warmth or erythema. No masses.. Wound #1 status is Open. Original cause of wound was Pressure Injury. The wound is located on the Left Ischial Tuberosity. The wound measures 3.3cm length x 3.5cm width x 0.2cm depth; 9.071cm^2 area and 1.814cm^3 volume. There is no tunneling or undermining noted. There is a large amount of purulent drainage noted. The wound margin is distinct with the outline attached to the wound base. There is small (1-33%) red granulation within the wound bed. There is a large (67-100%) amount of necrotic tissue within the wound bed including Eschar and Adherent Slough. The periwound skin appearance exhibited: Excoriation, Maceration, Erythema. The surrounding wound skin color is noted with erythema which is circumferential. Periwound temperature  was noted as No Abnormality. The periwound has tenderness on palpation. Assessment Active Problems ICD-10 E11.622 - Type 2 diabetes mellitus with other skin ulcer L89.223 - Pressure ulcer of left hip, stage 3 I69.998 - Other sequelae following unspecified cerebrovascular disease E44.0 - Moderate protein-calorie malnutrition [name], [name] ([id number]) this 83 year old moderately built gentleman has been seen by me for a stage III decubitus ulcer of the left hip associated with diabetes mellitus and multiple other comorbidities including severe protein calorie malnutrition. After review of his recent ER visit and his x-rays I have recommended: 1. Santyl ointment locally to be changed daily after washing this well 2. Offloading is been discussed in great detail 3. Adequate bed support possibly a group to bed surface 4. Adequate protein, vitamin A, vitamin C and zinc 5. Regular visits to the wound center His wife was at the bedside and I have discussed the need for a advanced directive regarding his healthcare and she is considering a DO NOT RESUSCITATE. Procedures Wound #1 Pre-procedure diagnosis of Wound #1 is a Pressure Ulcer located on the Left Ischial Tuberosity . There was a Skin/Subcutaneous Tissue Debridement ([id number]) debridement with total area of 11.55 sq cm performed by Clinton Fudge, MD. with  the following instrument(s): Blade and Forceps to remove Viable and Non-Viable tissue/material including Eschar and Subcutaneous after achieving pain control using Lidocaine 4% Topical Solution. A time out was conducted at 13:28, prior to the start of the procedure. A Minimum amount of bleeding was controlled with Pressure. The procedure was tolerated well with a pain level of 0 throughout and a pain level of 0 following the procedure. Post Debridement Measurements: 3.3cm length x 3.5cm width x 0.3cm depth; 2.721cm^3 volume. Post debridement Stage noted as Category/Stage III. Character  of Wound/Ulcer Post Debridement is improved. Post procedure Diagnosis Wound #1: Same as Pre-Procedure Plan Wound Cleansing: Wound #1 Left Ischial Tuberosity: Clean wound with Normal Saline. Cleanse wound with mild soap and water May Shower, gently pat wound dry prior to applying new dressing. Anesthetic: Wound #1 Left Ischial Tuberosity: Topical Lidocaine 4% cream applied to wound bed prior to debridement Skin Barriers/Peri-Wound Care: Wound #1 Left Ischial Tuberosity: Skin Prep Primary Wound Dressing: Wound #1 Left Ischial Tuberosity: Santyl Ointment Secondary Dressing: Wound #1 Left Ischial Tuberosity: Dry Gauze Boardered Foam Dressing Dressing Change Frequency: Wound #1 Left Ischial Tuberosity: Change dressing every day. [name], [name] ([id number]) Other: - as needed Follow-up Appointments: Wound #1 Left Ischial Tuberosity: Return Appointment in 1 week. Off-Loading: Wound #1 Left Ischial Tuberosity: Mattress - order support surface for patient bed Turn and reposition every 2 hours Additional Orders / Instructions: Wound #1 Left Ischial Tuberosity: Increase protein intake. Home Health: Wound #1 Left Ischial Tuberosity: [place] for [place] Nurse may visit PRN to address patient s wound care needs. FACE TO FACE ENCOUNTER: MEDICARE and MEDICAID PATIENTS: I certify that this patient is under my care and that I had a face-to-face encounter that meets the physician face-to-face encounter requirements with this patient on this date. The encounter with the patient was in whole or in part for the following MEDICAL CONDITION: (primary reason for [place]) MEDICAL NECESSITY: I certify, that based on my findings, NURSING services are a medically necessary home health service. HOME BOUND STATUS: I certify that my clinical findings support that this patient is homebound (i.e., Due to illness or injury, pt requires aid of supportive devices such  as crutches, cane, wheelchairs, walkers, the use of special transportation or the assistance of another person to leave their place of residence. There is a normal inability to leave the home and doing so requires considerable and taxing effort. Other absences are for medical reasons / religious services and are infrequent or of short duration when for other reasons). If current dressing causes regression in wound condition, may D/C ordered dressing product/s and apply Normal Saline Moist Dressing daily until next [place] / Other MD appointment. [place] of regression in wound condition at [phone]. Please direct any NON-WOUND related issues/requests for orders to patient's Primary Care Physician Medications-please add to medication list.: P.O. Antibiotics - start antibiotic Santyl Enzymatic Ointment Other: - Vitamin C, Zinc, MVI The following medication(s) was prescribed: Santyl topical 250 unit/gram ointment ointment topical as directed starting 03/18/2017 this 83 year old moderately built gentleman has been seen by me for a stage III decubitus ulcer of the left hip associated with diabetes mellitus and multiple other comorbidities including severe protein calorie malnutrition. After review of his recent ER visit and his x-rays I have recommended: 1. Santyl ointment locally to be changed daily after washing this well 2. Offloading is been discussed in great detail 3. Adequate bed support possibly a group  to bed surface 4. Adequate protein, vitamin A, vitamin C and zinc 5. Regular visits to the wound center His wife was at the bedside and I have discussed the need for a advanced directive regarding his healthcare and she is considering a DO NOT RESUSCITATE. Electronic Signature(s) Signed: 03/18/2017 2:10:59 PM By: Clinton Fudge MD, FACS [place] [name], [name] ([id number]) Entered By: Clinton [name] on 03/18/2017 14:10:59 Petsch, Rhonda C.  ([id number]) -------------------------------------------------------------------------------- ROS/PFSH Details Patient Name: TABORDA, Kenston C. Date of Service: 03/18/2017 12:30 PM Medical Record Number: [id number] Patient Account Number: [id number] Date of Birth/Sex: 1923-10-08 (83 y.o. Male) Treating RN: Clinton [name] Primary Care Provider: Wenda [name] Other Clinician: Referring Provider: Wenda [name] Treating Provider/Extender: Clinton [name] in Treatment: 1 Unable to Obtain Patient History due to oo Aphasia Information Obtained From Patient Wound History Do you currently have one or more open woundso Yes How many open wounds do you currently haveo 1 Approximately how long have you had your woundso 1 week How have you been treating your wound(s) until nowo abt ointment Has your wound(s) ever healed and then re-openedo No Have you had any lab work done in the past montho No Have you tested positive for an antibiotic resistant organism (MRSA, VRE)o No Have you tested positive for osteomyelitis (bone infection)o No Have you had any tests for circulation on your legso No Eyes Medical History: Positive for: Cataracts Respiratory Medical History: Positive for: Asthma Endocrine Medical History: Positive for: Type II Diabetes Time with diabetes: 10 yrs Treated with: Oral agents Blood sugar tested every day: No Oncologic Medical History: Negative for: Received Chemotherapy; Received Radiation HBO Extended History Items Eyes: Cataracts Immunizations Pneumococcal Vaccine: Received Pneumococcal Vaccination: Yes Emmitte, Surgeon Tremane C. ([id number]) Implantable Devices Family and Social History Never smoker; Marital Status - Married; Alcohol Use: Never; Drug Use: No History; Caffeine Use: Daily; Financial Concerns: No; Food, Clothing or Shelter Needs: No; Support System Lacking: No; Transportation Concerns: No; Advanced Directives: No; Patient does not want information on Advanced  Directives; Do not resuscitate: No; Living Will: No; Medical Power of Attorney: No Physician Affirmation I have reviewed and agree with the above information. Electronic Signature(s) Signed: 03/18/2017 4:21:18 PM By: Clinton Fudge MD, FACS Signed: 03/18/2017 4:48:58 PM By: Clinton [name] Entered By: Clinton [name] on 03/18/2017 14:08:38 Matusek, Drayson C. ([id number]) -------------------------------------------------------------------------------- SuperBill Details Patient Name: HAFFEY, Shanon C. Date of Service: 03/18/2017 Medical Record Number: [id number] Patient Account Number: [id number] Date of Birth/Sex: 07-Dec-1923 (83 y.o. Male) Treating RN: Clinton [name] Primary Care Provider: Wenda [name] Other Clinician: Referring Provider: Wenda [name] Treating Provider/Extender: Clinton [name] in Treatment: 1 Diagnosis Coding ICD-10 Codes Code Description [phone] Type 2 diabetes mellitus with other skin ulcer L89.223 Pressure ulcer of left hip, stage 3 I69.998 Other sequelae following unspecified cerebrovascular disease E44.0 Moderate protein-calorie malnutrition Facility Procedures CPT4 Code: 67341937 Description: 90240 - DEB SUBQ TISSUE 20 SQ CM/< ICD-10 Diagnosis Description E11.622 Type 2 diabetes mellitus with other skin ulcer L89.223 Pressure ulcer of left hip, stage 3 I69.998 Other sequelae following unspecified cerebrovascular dis E44.0  Moderate protein-calorie malnutrition Modifier: ease Quantity: 1 Physician Procedures CPT4 Code: 9735329 Description: 99213 - WC PHYS LEVEL 3 - EST PT ICD-10 Diagnosis Description E11.622 Type 2 diabetes mellitus with other skin ulcer L89.223 Pressure ulcer of left hip, stage 3 I69.998 Other sequelae following unspecified cerebrovascular dise E44.0 Moderate  protein-calorie malnutrition Modifier: 25 ase Quantity: 1 CPT4 Code: 9242683 Description: 11042 - WC PHYS SUBQ TISS 20 SQ CM ICD-10 Diagnosis Description E11.622 Type  2 diabetes mellitus  with other skin ulcer L89.223 Pressure ulcer of left hip, stage 3 I69.998 Other sequelae following unspecified cerebrovascular dise E44.0  Moderate protein-calorie malnutrition Modifier: ase Quantity: 1 Electronic Signature(s) Signed: 03/18/2017 2:11:25 PM By: Clinton Fudge MD, FACS Entered By: Clinton [name] on 03/18/2017 14:11:24

## 2017-03-21 ENCOUNTER — Ambulatory Visit: Payer: Medicare Other | Admitting: Nurse Practitioner

## 2017-03-22 ENCOUNTER — Ambulatory Visit: Payer: Medicare Other | Admitting: Internal Medicine

## 2017-03-25 ENCOUNTER — Ambulatory Visit: Payer: Medicare Other | Admitting: Surgery

## 2017-03-28 ENCOUNTER — Emergency Department (HOSPITAL_COMMUNITY): Payer: Medicare Other

## 2017-03-28 ENCOUNTER — Encounter (HOSPITAL_COMMUNITY): Payer: Self-pay | Admitting: Family Medicine

## 2017-03-28 ENCOUNTER — Emergency Department (HOSPITAL_COMMUNITY)
Admission: EM | Admit: 2017-03-28 | Discharge: 2017-03-28 | Disposition: A | Discharge status: Home / Self Care | Payer: Medicare Other | Source: Home / Self Care | Attending: Emergency Medicine | Admitting: Emergency Medicine

## 2017-03-28 DIAGNOSIS — S098XXA Other specified injuries of head, initial encounter: Secondary | ICD-10-CM

## 2017-03-28 DIAGNOSIS — I10 Essential (primary) hypertension: Secondary | ICD-10-CM | POA: Insufficient documentation

## 2017-03-28 DIAGNOSIS — F0390 Unspecified dementia without behavioral disturbance: Secondary | ICD-10-CM

## 2017-03-28 DIAGNOSIS — R4182 Altered mental status, unspecified: Secondary | ICD-10-CM | POA: Diagnosis not present

## 2017-03-28 DIAGNOSIS — Y999 Unspecified external cause status: Secondary | ICD-10-CM

## 2017-03-28 DIAGNOSIS — X58XXXA Exposure to other specified factors, initial encounter: Secondary | ICD-10-CM

## 2017-03-28 DIAGNOSIS — M25531 Pain in right wrist: Secondary | ICD-10-CM | POA: Insufficient documentation

## 2017-03-28 DIAGNOSIS — A4102 Sepsis due to Methicillin resistant Staphylococcus aureus: Secondary | ICD-10-CM | POA: Diagnosis not present

## 2017-03-28 DIAGNOSIS — Y929 Unspecified place or not applicable: Secondary | ICD-10-CM | POA: Insufficient documentation

## 2017-03-28 DIAGNOSIS — Y9389 Activity, other specified: Secondary | ICD-10-CM

## 2017-03-28 DIAGNOSIS — Z8673 Personal history of transient ischemic attack (TIA), and cerebral infarction without residual deficits: Secondary | ICD-10-CM | POA: Insufficient documentation

## 2017-03-28 DIAGNOSIS — S0990XA Unspecified injury of head, initial encounter: Secondary | ICD-10-CM

## 2017-03-28 DIAGNOSIS — E119 Type 2 diabetes mellitus without complications: Secondary | ICD-10-CM | POA: Insufficient documentation

## 2017-03-28 DIAGNOSIS — W19XXXA Unspecified fall, initial encounter: Secondary | ICD-10-CM

## 2017-03-28 HISTORY — DX: Unspecified dementia, unspecified severity, without behavioral disturbance, psychotic disturbance, mood disturbance, and anxiety: F03.90

## 2017-03-28 NOTE — ED Triage Notes (Signed)
Patient is from home and transported via [facility]. Patient had a fall on Saturday and spouse reported to EMS that patient was evaluated for falls. Patient started complaining of right wrist pain to family. Family is requesting patient be evaluated. However, when EMS inquired about patients wrist pain, he denied. Patient does have dementia and per family, he is at baseline.

## 2017-03-28 NOTE — ED Provider Notes (Signed)
Emergency Department Provider Note   I have reviewed the triage vital signs and the nursing notes.   HISTORY  Chief Complaint Wrist Pain   HPI Clinton [name] is a 83 y.o. male with PMH of Dementia, DM, and HTN presents to the emergency department for evaluation of right wrist pain after a fall.  Per EMS the patient had a fall 2 days ago but did not present to the emergency department.  Family noticed some bruising over the wrist and request that he be evaluated for this.  According to EMS the patient is at his mental status baseline.  Level 5 caveat: Dementia  Past Medical History:  Diagnosis Date  . Dementia   . Diabetes mellitus without complication ([place])   . Hypertension   . Stroke [facility])     Patient Active Problem List   Diagnosis Date Noted  . Aphasia as late effect of cerebrovascular accident 08/18/2016  . Dysarthria 07/13/2016  . Aspiration pneumonia ([place]) 07/13/2016  . Leukocytosis 07/13/2016  . HLD (hyperlipidemia) 07/13/2016  . Ischemic stroke ([place])   . Recurrent aspiration bronchitis/pneumonia ([place])   . 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 Diabetes mellitus type 2 in nonobese ([place]) 03/04/2014  . Dysphagia 03/04/2014  . CVA (cerebral vascular accident) ([place]) 11/26/2013  . CVA (cerebral infarction) 10/15/2013    Past Surgical History:  Procedure Laterality Date  . CARDIAC CATHETERIZATION    . EYE SURGERY      Current Outpatient Rx  . Order #: [id number] Class: Normal  . Order #: [id number] Class: Normal  . Order #: [id number] Class: Historical Med  . Order #: [id number] Class: Historical Med  . Order #: [id number] Class: Historical Med  . Order #: [id number] Class: Historical Med  . Order #: [id number] Class: Historical Med  . Order #: [id number] Class: Historical Med  . Order #: [id number] Class: Historical Med  . Order #: [id number] Class: Print  . Order #: [id number] Class: No Print    Allergies Patient has no known  allergies.  Family History  Problem Relation Age of Onset  . Cancer Mother     Social History Social History   Tobacco Use  . Smoking status: Never Smoker  . Smokeless tobacco: Never Used  Substance Use Topics  . Alcohol use: No    Alcohol/week: 0.0 oz    Comment: 09/03/14 one glass of wine weekly  . Drug use: No    Review of Systems  Level 5 caveat: Dementia  ____________________________________________   PHYSICAL EXAM:  VITAL SIGNS: ED Triage Vitals [03/28/17 1617]  Enc Vitals Group     BP 117/80     Pulse Rate (!) 112     Resp 16     Temp 98.8 F (37.1 C)     Temp Source Oral     SpO2 100 %   Constitutional: Alert and oriented. Chronically ill-appearing. Unable to communicate verbally but making eye-contact.  Eyes: Conjunctivae are normal. PERRL. Head: Atraumatic. Nose: No congestion/rhinnorhea. Mouth/Throat: Mucous membranes are moist.  Neck: No stridor. No cervical spine tenderness to palpation. Cardiovascular: Tachycardia. Good peripheral circulation. Grossly normal heart sounds.   Respiratory: Normal respiratory effort.  No retractions. Lungs CTAB. Gastrointestinal: Soft and nontender. No distention.  Musculoskeletal: No lower extremity tenderness nor edema. No gross deformities of extremities. No apparent tenderness to palpation of the right wrist but some bruising in noted there. No deformity. No tenderness to palpation of the elbow and/or shoulder.  Neurologic: Nodding to some questions but not speaking. No gross  focal neurologic deficits are appreciated.  Skin:  Skin is warm, dry and intact. No rash noted.  ____________________________________________  RADIOLOGY  Dg Wrist Complete Right  Result Date: 03/28/2017 CLINICAL DATA:  Wrist pain, fall EXAM: RIGHT WRIST - COMPLETE 3+ VIEW COMPARISON:  None. FINDINGS: No fracture or dislocation. Chondrocalcinosis. Marked arthritis at the first [facility] joint. Degenerative changes at the radiocarpal joint.  Widened scaffold lunate interval measuring up to 5 mm. Vascular calcification IMPRESSION: 1. No definite acute osseous abnormality 2. Chondrocalcinosis.  Arthritis at the wrists. 3. Widened scaffold lunate interval consistent with ligamentous abnormality. Electronically Signed   By: Donavan Foil M.D.   On: 03/28/2017 16:53   Ct Head Wo Contrast  Result Date: 03/28/2017 CLINICAL DATA:  Fall EXAM: CT HEAD WITHOUT CONTRAST CT CERVICAL SPINE WITHOUT CONTRAST TECHNIQUE: Multidetector CT imaging of the head and cervical spine was performed following the standard protocol without intravenous contrast. Multiplanar CT image reconstructions of the cervical spine were also generated. COMPARISON:  07/14/2016, 07/13/2016 FINDINGS: CT HEAD FINDINGS Brain: No acute territorial infarction, hemorrhage or intracranial mass is visualized. Moderate severe atrophy. Moderate small vessel ischemic changes of the white matter. Old lacunar infarcts within the thalamus and bilateral basal ganglia. Stable prominent ventricles felt secondary to atrophy Vascular: No hyperdense vessels. Vertebral and carotid calcification Skull: No fracture. Sinuses/Orbits: Mild mucosal thickening in the ethmoid sinuses. No acute orbital abnormality. Mucous retention cysts in the maxillary sinuses Other: None CT CERVICAL SPINE FINDINGS Alignment: No subluxation.  Facet alignment is within normal limits. Skull base and vertebrae: No acute fracture. No primary bone lesion or focal pathologic process. Soft tissues and spinal canal: No prevertebral fluid or swelling. No visible canal hematoma. Disc levels: Moderate degenerative changes at C3-C4, and C4-C5, moderate severe degenerative changes at C6-C7. Anterior osteophytes at all levels. Multiple level bilateral facet hypertrophy. Multiple level foraminal narrowing, most marked at C3-C4. Upper chest: Lung apices are clear.  Carotid artery calcification. Other: None IMPRESSION: 1. No CT evidence for acute  intracranial abnormality. Atrophy and small vessel ischemic changes of the white matter 2. Degenerative changes of the cervical spine. No acute fracture is seen. Electronically Signed   By: Donavan Foil M.D.   On: 03/28/2017 17:23   Ct Cervical Spine Wo Contrast  Result Date: 03/28/2017 CLINICAL DATA:  Fall EXAM: CT HEAD WITHOUT CONTRAST CT CERVICAL SPINE WITHOUT CONTRAST TECHNIQUE: Multidetector CT imaging of the head and cervical spine was performed following the standard protocol without intravenous contrast. Multiplanar CT image reconstructions of the cervical spine were also generated. COMPARISON:  07/14/2016, 07/13/2016 FINDINGS: CT HEAD FINDINGS Brain: No acute territorial infarction, hemorrhage or intracranial mass is visualized. Moderate severe atrophy. Moderate small vessel ischemic changes of the white matter. Old lacunar infarcts within the thalamus and bilateral basal ganglia. Stable prominent ventricles felt secondary to atrophy Vascular: No hyperdense vessels. Vertebral and carotid calcification Skull: No fracture. Sinuses/Orbits: Mild mucosal thickening in the ethmoid sinuses. No acute orbital abnormality. Mucous retention cysts in the maxillary sinuses Other: None CT CERVICAL SPINE FINDINGS Alignment: No subluxation.  Facet alignment is within normal limits. Skull base and vertebrae: No acute fracture. No primary bone lesion or focal pathologic process. Soft tissues and spinal canal: No prevertebral fluid or swelling. No visible canal hematoma. Disc levels: Moderate degenerative changes at C3-C4, and C4-C5, moderate severe degenerative changes at C6-C7. Anterior osteophytes at all levels. Multiple level bilateral facet hypertrophy. Multiple level foraminal narrowing, most marked at C3-C4. Upper chest: Lung apices are clear.  Carotid artery calcification. Other: None IMPRESSION: 1. No CT evidence for acute intracranial abnormality. Atrophy and small vessel ischemic changes of the white matter  2. Degenerative changes of the cervical spine. No acute fracture is seen. Electronically Signed   By: Donavan Foil M.D.   On: 03/28/2017 17:23    ____________________________________________   PROCEDURES  Procedure(s) performed:   Procedures  None ____________________________________________   INITIAL IMPRESSION / ASSESSMENT AND PLAN / ED COURSE  Pertinent labs & imaging results that were available during my care of the patient were reviewed by me and considered in my medical decision making (see chart for details).  Patient with dementia presents to the emergency department ablation of right wrist pain and bruising.  The patient nods to say that he is not having pain in the wrist.  There is bruising there.  He has significant dementia.  No family at bedside for further history.  Plan for plain film of the right wrist as well as CT scan of the head and cervical spine given recent fall and exam limitations noted above. Attempted to call the patient's wife for collateral information but no on e answered. This is the only emergency contact number listed.   05:40 PM Spoke with the social worker he recommended I write a prescription for a wheelchair and have them present to their local medical supply store. Plan for [facility] to return patient home. Already has a home health aid.   At this time, I do not feel there is any life-threatening condition present. I have reviewed and discussed all results (EKG, imaging, lab, urine as appropriate), exam findings with patient. I have reviewed nursing notes and appropriate previous records.  I feel the patient is safe to be discharged home without further emergent workup. Discussed usual and customary return precautions. Patient and family (if present) verbalize understanding and are comfortable with this plan.  Patient will follow-up with their primary care provider. If they do not have a primary care provider, information for follow-up has been provided to  them. All questions have been answered.  ____________________________________________  FINAL CLINICAL IMPRESSION(S) / ED DIAGNOSES  Final diagnoses:  Fall, initial encounter  Right wrist pain  Injury of head, initial encounter    MEDICATIONS GIVEN DURING THIS VISIT:  None   Note:  This document was prepared using Dragon voice recognition software and may include unintentional dictation errors.  Nanda Quinton, MD Emergency Medicine   Quinnlyn Hearns, [facility], MD 03/28/17 [phone]

## 2017-03-28 NOTE — ED Notes (Signed)
Notified [facility] for transportation back to his residents.   

## 2017-03-28 NOTE — Progress Notes (Signed)
CM was consulted for DME wheelchair.  Due to the time of day, CM was not able to reach the in-hospital DME supplier.  Advised Dr. Laverta Baltimore to write a paper prescription which pt/family can take a medical supply store.  No further CM needs noted at this time.

## 2017-03-28 NOTE — Discharge Instructions (Signed)

## 2017-03-28 NOTE — ED Notes (Signed)
Bed: WA08 Expected date:  Expected time:  Means of arrival:  Comments: Fall wrist pain

## 2017-03-30 ENCOUNTER — Encounter (HOSPITAL_COMMUNITY): Payer: Self-pay | Admitting: Emergency Medicine

## 2017-03-30 ENCOUNTER — Emergency Department (HOSPITAL_COMMUNITY): Payer: Medicare Other

## 2017-03-30 ENCOUNTER — Other Ambulatory Visit: Payer: Self-pay

## 2017-03-30 ENCOUNTER — Inpatient Hospital Stay (HOSPITAL_COMMUNITY)
Admission: EM | Admit: 2017-03-30 | Discharge: 2017-04-04 | DRG: 871 | Disposition: A | Discharge status: Hospice | Payer: Medicare Other | Attending: Internal Medicine | Admitting: Internal Medicine

## 2017-03-30 DIAGNOSIS — J690 Pneumonitis due to inhalation of food and vomit: Secondary | ICD-10-CM | POA: Diagnosis present

## 2017-03-30 DIAGNOSIS — F419 Anxiety disorder, unspecified: Secondary | ICD-10-CM | POA: Diagnosis present

## 2017-03-30 DIAGNOSIS — E118 Type 2 diabetes mellitus with unspecified complications: Secondary | ICD-10-CM

## 2017-03-30 DIAGNOSIS — I5032 Chronic diastolic (congestive) heart failure: Secondary | ICD-10-CM | POA: Diagnosis present

## 2017-03-30 DIAGNOSIS — Z66 Do not resuscitate: Secondary | ICD-10-CM | POA: Diagnosis present

## 2017-03-30 DIAGNOSIS — E119 Type 2 diabetes mellitus without complications: Secondary | ICD-10-CM | POA: Diagnosis present

## 2017-03-30 DIAGNOSIS — E785 Hyperlipidemia, unspecified: Secondary | ICD-10-CM | POA: Diagnosis present

## 2017-03-30 DIAGNOSIS — E870 Hyperosmolality and hypernatremia: Secondary | ICD-10-CM | POA: Insufficient documentation

## 2017-03-30 DIAGNOSIS — I639 Cerebral infarction, unspecified: Secondary | ICD-10-CM

## 2017-03-30 DIAGNOSIS — I10 Essential (primary) hypertension: Secondary | ICD-10-CM | POA: Diagnosis not present

## 2017-03-30 DIAGNOSIS — E876 Hypokalemia: Secondary | ICD-10-CM | POA: Diagnosis present

## 2017-03-30 DIAGNOSIS — A4102 Sepsis due to Methicillin resistant Staphylococcus aureus: Secondary | ICD-10-CM | POA: Diagnosis present

## 2017-03-30 DIAGNOSIS — L89223 Pressure ulcer of left hip, stage 3: Secondary | ICD-10-CM | POA: Diagnosis present

## 2017-03-30 DIAGNOSIS — Z7982 Long term (current) use of aspirin: Secondary | ICD-10-CM | POA: Diagnosis not present

## 2017-03-30 DIAGNOSIS — Z79899 Other long term (current) drug therapy: Secondary | ICD-10-CM

## 2017-03-30 DIAGNOSIS — E872 Acidosis: Secondary | ICD-10-CM | POA: Diagnosis present

## 2017-03-30 DIAGNOSIS — Z515 Encounter for palliative care: Secondary | ICD-10-CM | POA: Diagnosis present

## 2017-03-30 DIAGNOSIS — R4182 Altered mental status, unspecified: Secondary | ICD-10-CM | POA: Diagnosis present

## 2017-03-30 DIAGNOSIS — I69321 Dysphasia following cerebral infarction: Secondary | ICD-10-CM

## 2017-03-30 DIAGNOSIS — F0151 Vascular dementia with behavioral disturbance: Secondary | ICD-10-CM | POA: Diagnosis present

## 2017-03-30 DIAGNOSIS — I69322 Dysarthria following cerebral infarction: Secondary | ICD-10-CM | POA: Diagnosis not present

## 2017-03-30 DIAGNOSIS — I272 Pulmonary hypertension, unspecified: Secondary | ICD-10-CM | POA: Diagnosis present

## 2017-03-30 DIAGNOSIS — Z681 Body mass index (BMI) 19 or less, adult: Secondary | ICD-10-CM | POA: Diagnosis not present

## 2017-03-30 DIAGNOSIS — N179 Acute kidney failure, unspecified: Secondary | ICD-10-CM | POA: Diagnosis present

## 2017-03-30 DIAGNOSIS — Z7401 Bed confinement status: Secondary | ICD-10-CM

## 2017-03-30 DIAGNOSIS — F0281 Dementia in other diseases classified elsewhere with behavioral disturbance: Secondary | ICD-10-CM | POA: Diagnosis present

## 2017-03-30 DIAGNOSIS — Z7902 Long term (current) use of antithrombotics/antiplatelets: Secondary | ICD-10-CM | POA: Diagnosis not present

## 2017-03-30 DIAGNOSIS — E7849 Other hyperlipidemia: Secondary | ICD-10-CM

## 2017-03-30 DIAGNOSIS — R6520 Severe sepsis without septic shock: Secondary | ICD-10-CM | POA: Diagnosis not present

## 2017-03-30 DIAGNOSIS — E43 Unspecified severe protein-calorie malnutrition: Secondary | ICD-10-CM | POA: Diagnosis not present

## 2017-03-30 DIAGNOSIS — R1310 Dysphagia, unspecified: Secondary | ICD-10-CM | POA: Diagnosis present

## 2017-03-30 DIAGNOSIS — A4101 Sepsis due to Methicillin susceptible Staphylococcus aureus: Secondary | ICD-10-CM | POA: Diagnosis not present

## 2017-03-30 DIAGNOSIS — Z7984 Long term (current) use of oral hypoglycemic drugs: Secondary | ICD-10-CM | POA: Diagnosis not present

## 2017-03-30 DIAGNOSIS — F01518 Vascular dementia, unspecified severity, with other behavioral disturbance: Secondary | ICD-10-CM

## 2017-03-30 DIAGNOSIS — E440 Moderate protein-calorie malnutrition: Secondary | ICD-10-CM | POA: Diagnosis present

## 2017-03-30 DIAGNOSIS — I110 Hypertensive heart disease with heart failure: Secondary | ICD-10-CM | POA: Diagnosis present

## 2017-03-30 DIAGNOSIS — A419 Sepsis, unspecified organism: Secondary | ICD-10-CM

## 2017-03-30 DIAGNOSIS — R0902 Hypoxemia: Secondary | ICD-10-CM

## 2017-03-30 DIAGNOSIS — E860 Dehydration: Secondary | ICD-10-CM | POA: Diagnosis not present

## 2017-03-30 DIAGNOSIS — R7881 Bacteremia: Secondary | ICD-10-CM | POA: Diagnosis not present

## 2017-03-30 DIAGNOSIS — Z4659 Encounter for fitting and adjustment of other gastrointestinal appliance and device: Secondary | ICD-10-CM

## 2017-03-30 LAB — GLUCOSE, CAPILLARY
Glucose-Capillary: 229 mg/dL — ABNORMAL HIGH (ref 65–99)
Glucose-Capillary: 240 mg/dL — ABNORMAL HIGH (ref 65–99)

## 2017-03-30 LAB — URINALYSIS, ROUTINE W REFLEX MICROSCOPIC
BILIRUBIN URINE: NEGATIVE
Glucose, UA: NEGATIVE mg/dL
KETONES UR: NEGATIVE mg/dL
Leukocytes, UA: NEGATIVE
Nitrite: NEGATIVE
Protein, ur: NEGATIVE mg/dL
Specific Gravity, Urine: 1.017 (ref 1.005–1.030)
pH: 5 (ref 5.0–8.0)

## 2017-03-30 LAB — CBC WITH DIFFERENTIAL/PLATELET
BASOS ABS: 0 10*3/uL (ref 0.0–0.1)
BASOS PCT: 0 %
Basophils Absolute: 0 10*3/uL (ref 0.0–0.1)
Basophils Relative: 0 %
Eosinophils Absolute: 0 10*3/uL (ref 0.0–0.7)
Eosinophils Absolute: 0 10*3/uL (ref 0.0–0.7)
Eosinophils Relative: 0 %
Eosinophils Relative: 0 %
HEMATOCRIT: 43.2 % (ref 39.0–52.0)
HEMATOCRIT: 39 % (ref 39.0–52.0)
HEMOGLOBIN: 13.4 g/dL (ref 13.0–17.0)
HEMOGLOBIN: 11.7 g/dL — AB (ref 13.0–17.0)
LYMPHS PCT: 7 %
Lymphocytes Relative: 7 %
Lymphs Abs: 1.7 10*3/uL (ref 0.7–4.0)
Lymphs Abs: 1.4 10*3/uL (ref 0.7–4.0)
MCH: 30.9 pg (ref 26.0–34.0)
MCH: 30.2 pg (ref 26.0–34.0)
MCHC: 31 g/dL (ref 30.0–36.0)
MCHC: 30 g/dL (ref 30.0–36.0)
MCV: 99.5 fL (ref 78.0–100.0)
MCV: 100.8 fL — ABNORMAL HIGH (ref 78.0–100.0)
MONO ABS: 1.2 10*3/uL — AB (ref 0.1–1.0)
MONOS PCT: 5 %
MONOS PCT: 6 %
Monocytes Absolute: 1.2 10*3/uL — ABNORMAL HIGH (ref 0.1–1.0)
NEUTROS ABS: 20.3 10*3/uL — AB (ref 1.7–7.7)
NEUTROS ABS: 18 10*3/uL — AB (ref 1.7–7.7)
NEUTROS PCT: 88 %
Neutrophils Relative %: 87 %
PLATELETS: 253 10*3/uL (ref 150–400)
Platelets: 204 10*3/uL (ref 150–400)
RBC: 4.34 MIL/uL (ref 4.22–5.81)
RBC: 3.87 MIL/uL — ABNORMAL LOW (ref 4.22–5.81)
RDW: 14.5 % (ref 11.5–15.5)
RDW: 14.7 % (ref 11.5–15.5)
WBC: 23.2 10*3/uL — ABNORMAL HIGH (ref 4.0–10.5)
WBC: 20.6 10*3/uL — ABNORMAL HIGH (ref 4.0–10.5)

## 2017-03-30 LAB — COMPREHENSIVE METABOLIC PANEL
ALK PHOS: 67 U/L (ref 38–126)
ALK PHOS: 56 U/L (ref 38–126)
ALT: 29 U/L (ref 17–63)
ALT: 22 U/L (ref 17–63)
ANION GAP: 9 (ref 5–15)
ANION GAP: 5 (ref 5–15)
AST: 38 U/L (ref 15–41)
AST: 29 U/L (ref 15–41)
Albumin: 2.9 g/dL — ABNORMAL LOW (ref 3.5–5.0)
Albumin: 2.4 g/dL — ABNORMAL LOW (ref 3.5–5.0)
BILIRUBIN TOTAL: 0.6 mg/dL (ref 0.3–1.2)
BUN: 83 mg/dL — ABNORMAL HIGH (ref 6–20)
BUN: 67 mg/dL — ABNORMAL HIGH (ref 6–20)
CALCIUM: 9.9 mg/dL (ref 8.9–10.3)
CALCIUM: 8.6 mg/dL — AB (ref 8.9–10.3)
CO2: 29 mmol/L (ref 22–32)
CO2: 25 mmol/L (ref 22–32)
Chloride: 122 mmol/L — ABNORMAL HIGH (ref 101–111)
Chloride: 128 mmol/L — ABNORMAL HIGH (ref 101–111)
Creatinine, Ser: 2.62 mg/dL — ABNORMAL HIGH (ref 0.61–1.24)
Creatinine, Ser: 2.15 mg/dL — ABNORMAL HIGH (ref 0.61–1.24)
GFR calc Af Amer: 29 mL/min — ABNORMAL LOW (ref >60)
GFR calc non Af Amer: 20 mL/min — ABNORMAL LOW (ref >60)
GFR calc non Af Amer: 25 mL/min — ABNORMAL LOW (ref >60)
GFR, EST AFRICAN AMERICAN: 23 mL/min — AB (ref >60)
Glucose, Bld: 224 mg/dL — ABNORMAL HIGH (ref 65–99)
Glucose, Bld: 200 mg/dL — ABNORMAL HIGH (ref 65–99)
POTASSIUM: 4.6 mmol/L (ref 3.5–5.1)
POTASSIUM: 4.1 mmol/L (ref 3.5–5.1)
SODIUM: 160 mmol/L — AB (ref 135–145)
Sodium: 158 mmol/L — ABNORMAL HIGH (ref 135–145)
TOTAL PROTEIN: 7.5 g/dL (ref 6.5–8.1)
TOTAL PROTEIN: 6.4 g/dL — AB (ref 6.5–8.1)
Total Bilirubin: 0.8 mg/dL (ref 0.3–1.2)

## 2017-03-30 LAB — LACTIC ACID, PLASMA
LACTIC ACID, VENOUS: 1.4 mmol/L (ref 0.5–1.9)
Lactic Acid, Venous: 2.2 mmol/L (ref 0.5–1.9)

## 2017-03-30 LAB — PROCALCITONIN: PROCALCITONIN: 1.53 ng/mL

## 2017-03-30 LAB — LIPID PANEL
CHOLESTEROL: 113 mg/dL (ref 0–200)
HDL: 26 mg/dL — ABNORMAL LOW (ref >40)
LDL CALC: 60 mg/dL (ref 0–99)
Total CHOL/HDL Ratio: 4.3 RATIO
Triglycerides: 135 mg/dL (ref <150)
VLDL: 27 mg/dL (ref 0–40)

## 2017-03-30 LAB — I-STAT CG4 LACTIC ACID, ED: Lactic Acid, Venous: 2.25 mmol/L (ref 0.5–1.9)

## 2017-03-30 LAB — PROTIME-INR
INR: 1.19
PROTHROMBIN TIME: 15 s (ref 11.4–15.2)

## 2017-03-30 LAB — CBG MONITORING, ED: Glucose-Capillary: 190 mg/dL — ABNORMAL HIGH (ref 65–99)

## 2017-03-30 LAB — MRSA PCR SCREENING: MRSA by PCR: POSITIVE — AB

## 2017-03-30 LAB — APTT: aPTT: 32 seconds (ref 24–36)

## 2017-03-30 MED ORDER — DEXTROSE 5 % IV SOLN
2.0000 g | Freq: Once | INTRAVENOUS | Status: DC
  Filled 2017-03-30: qty 2

## 2017-03-30 MED ORDER — SODIUM CHLORIDE 0.9 % IV SOLN
3.0000 g | Freq: Once | INTRAVENOUS | Status: AC
  Administered 2017-03-30: 3 g via INTRAVENOUS
  Filled 2017-03-30: qty 3

## 2017-03-30 MED ORDER — SODIUM CHLORIDE 0.9 % IV SOLN
1.5000 g | Freq: Two times a day (BID) | INTRAVENOUS | Status: DC
  Administered 2017-03-31: 1.5 g via INTRAVENOUS
  Filled 2017-03-30: qty 1.5

## 2017-03-30 MED ORDER — INSULIN ASPART 100 UNIT/ML ~~LOC~~ SOLN
0.0000 [IU] | SUBCUTANEOUS | Status: DC
  Administered 2017-03-30: 3 [IU] via SUBCUTANEOUS
  Administered 2017-03-31 (×2): 5 [IU] via SUBCUTANEOUS
  Administered 2017-03-31: 3 [IU] via SUBCUTANEOUS

## 2017-03-30 MED ORDER — ACETAMINOPHEN 650 MG RE SUPP
650.0000 mg | Freq: Once | RECTAL | Status: AC
  Administered 2017-03-30: 650 mg via RECTAL
  Filled 2017-03-30: qty 1

## 2017-03-30 MED ORDER — SODIUM CHLORIDE 0.9 % IV BOLUS (SEPSIS)
500.0000 mL | Freq: Once | INTRAVENOUS | Status: AC
  Administered 2017-03-30: 500 mL via INTRAVENOUS

## 2017-03-30 MED ORDER — SODIUM CHLORIDE 0.9 % IV BOLUS (SEPSIS)
1000.0000 mL | Freq: Once | INTRAVENOUS | Status: AC
  Administered 2017-03-30: 1000 mL via INTRAVENOUS

## 2017-03-30 MED ORDER — SODIUM CHLORIDE 0.9 % IV BOLUS (SEPSIS): 1000.0000 mL | Freq: Once | INTRAVENOUS | Status: DC

## 2017-03-30 MED ORDER — IPRATROPIUM-ALBUTEROL 0.5-2.5 (3) MG/3ML IN SOLN: 3.0000 mL | Freq: Three times a day (TID) | RESPIRATORY_TRACT | Status: DC

## 2017-03-30 MED ORDER — METHYLPREDNISOLONE SODIUM SUCC 125 MG IJ SOLR
60.0000 mg | Freq: Every day | INTRAMUSCULAR | Status: DC
  Administered 2017-03-30 – 2017-04-01 (×3): 60 mg via INTRAVENOUS
  Filled 2017-03-30 (×3): qty 2

## 2017-03-30 MED ORDER — LEVALBUTEROL HCL 1.25 MG/0.5ML IN NEBU
1.2500 mg | INHALATION_SOLUTION | Freq: Three times a day (TID) | RESPIRATORY_TRACT | Status: DC
  Administered 2017-03-30: 1.25 mg via RESPIRATORY_TRACT
  Filled 2017-03-30: qty 0.5

## 2017-03-30 MED ORDER — DEXTROSE-NACL 5-0.9 % IV SOLN
INTRAVENOUS | Status: DC
  Administered 2017-03-30: 18:00:00 via INTRAVENOUS

## 2017-03-30 NOTE — Progress Notes (Signed)
ANTICOAGULATION CONSULT NOTE - Initial Consult  Pharmacy Consult for sq heparin for VTE px Indication: VTE prophylaxis  No Known Allergies  Patient Measurements: Weight: 105 lb (47.6 kg) Heparin Dosing Weight: 47.6 kg  Vital Signs: Temp: 97.9 F (36.6 C) (11/14 1715) Temp Source: Oral (11/14 1715) BP: 132/76 (11/14 1730) Pulse Rate: 102 (11/14 1730)  Labs: Recent Labs    03/30/17 1234  HGB 13.4  HCT 43.2  PLT 253  CREATININE 2.62*    CrCl cannot be calculated (Unknown ideal weight.).   Medical History: Past Medical History:  Diagnosis Date  . Dementia   . Diabetes mellitus without complication ([place])   . Hypertension   . Stroke [facility])      Assessment: 83 yo M in ED with AMS.  Pharmacy consulted to dose sq heparin for VTE px.  Wt 47.6 kg.  SCr 2.62.   Goal of Therapy:  VTE px   Plan:  Heparin 5000 units sq q12h for VTE px Pharmacy to sign off  Eudelia Bunch, Pharm.D. [phone] 03/30/2017 5:51 PM  03/30/2017,5:51 PM

## 2017-03-30 NOTE — ED Triage Notes (Signed)
He comes to [place] from home with stated c/o of his wife, with whom he lives is that he is "less verbal and responsive than usual for past couple of days". He also is reported to have fallen Friday, for which he was seen by his pcp, who applied right arm sling. A piece of what appeared to be french toast was removed by paramedics shortly before arrival to E.D., and after arrival it was noted that he had some coarse upper airway sounds. At that time we deep suctioned him with a yankauer device, after which his airway markedly cleared.

## 2017-03-30 NOTE — H&P (Signed)
[facility] History and Physical  Clinton [name] ERX:[id number] DOB: February 21, 1924 DOA: 03/30/2017  Referring physician:  PCP: Wenda Low, MD   Chief Complaint: Altered metal status  HPI: Clinton [name] is a 83 y.o. 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 Per wife has not been eating well last year, dementia, diabetes Type 2 with consultation, Chronic Diastolic CHF (Echocardiogram 07/13/2016 EF 55-60%: PA peak pressure 33 mmHg),, pulmonary HTN, HTN, HLD,   Presents to the ED today with altered mental status. Pt lives with his wife who called EMS. Pt has dementia and is nonverbal, so he is unable to provide any hx. Per EMS, when they arrived, he was lying flat and had a big piece of french toast stuck in his throat. They had to remove it with [place] forceps. Pt was here on 11/12 for a fall with right wrist pain. No fracture, but he was placed in a splint. He did not have a fever then, no AMS. CT head was done which showed no acute changes.  Per wife at baseline patient able to nod yes and no questions, antra appropriately. Positive chronic dysphagia.        Review of Systems:  Constitutional:  Positive weight loss, negative night sweats, Fevers, chills, fatigue. Cachectic HEENT:  No headaches, positive Difficulty swallowing,T Cardio-vascular:  Negative chest pain, Orthopnea, PND, swelling in lower extremities, anasarca, dizziness, palpitations  GI:  No heartburn, indigestion, abdominal pain, nausea, vomiting, diarrhea, change in bowel habits, loss of appetite  Resp:  Negative shortness of breath with exertion or at rest. No excess mucus, no productive cough, No non-productive cough, No coughing up of blood.No change in color of mucus.No wheezing.No chest wall deformity  Skin:  no rash or lesions.  GU:  no dysuria, change in color of urine, no urgency or frequency. No flank pain.  Musculoskeletal:  Positive left lateral hip pain  (secondary to decubitus ulcer) , No joint pain or swelling. No decreased range of motion. No back pain.  Psych:  Positive dementia     Past Medical History:  Diagnosis Date  . Dementia   . Diabetes mellitus without complication ([place])   . Hypertension   . Stroke [facility])    Past Surgical History:  Procedure Laterality Date  . CARDIAC CATHETERIZATION    . EYE SURGERY     Social History:  reports that  has never smoked. he has never used smokeless tobacco. He reports that he does not drink alcohol or use drugs.  No Known Allergies  Family History  Problem Relation Age of Onset  . Cancer Mother     Prior to Admission medications   Medication Sig Start Date End Date Taking? Authorizing Provider  atorvastatin (LIPITOR) 10 MG tablet TAKE 1 TABLET BY MOUTH DAILY. 10/01/14  Yes Rosalin Hawking, MD  clopidogrel (PLAVIX) 75 MG tablet TAKE 1 TABLET BY MOUTH ONCE A DAY 04/01/15  Yes Rosalin Hawking, MD  metFORMIN (GLUCOPHAGE) 500 MG tablet Take 500 mg by mouth 2 (two) times daily. 12/29/16  Yes [provider]  Multiple Vitamin (MULTIVITAMIN WITH MINERALS) TABS tablet Take 1 tablet by mouth daily.   Yes [provider]  Nutritional Supplements (FEEDING SUPPLEMENT, GLUCERNA 1.2 CAL,) LIQD Place 237 mLs into feeding tube daily.   Yes [provider]  Nutritional Supplements (NUTRITIONAL SUPPLEMENT PO) Take by mouth. House 2.0 - Med Pass 120 cc by mouth 2 times a day   Yes [provider]  vitamin  C (ASCORBIC ACID) 500 MG tablet Take 500 mg by mouth daily.   Yes [provider]  zinc gluconate 50 MG tablet Take 50 mg by mouth daily.   Yes [provider]  aspirin EC 81 MG tablet Take 81 mg by mouth daily.    [provider]  cephALEXin (KEFLEX) 500 MG capsule Take 1 capsule (500 mg total) by mouth 3 (three) times daily. Patient not taking: Reported on 03/28/2017 03/16/17   Pattricia Boss, MD  famotidine (PEPCID) 20 MG tablet Take 1 tablet (20  mg total) by mouth daily. Patient not taking: Reported on 03/28/2017 07/16/16   Jonetta Osgood, MD     Consultants:  None  Procedures/Significant Events:  Previous ED visit on 11/12 11/12 CT head WO contrast/CT C-spine WO contrast:Alignment: No subluxation.  Facet alignment is within normal limits.   Skull base and vertebrae: No acute fracture. No primary bone lesion or focal pathologic process.   Soft tissues and spinal canal: No prevertebral fluid or swelling. No visible canal hematoma.   Disc levels: Moderate degenerative changes at C3-C4, and C4-C5, moderate severe degenerative changes at C6-C7. Anterior osteophytes at all levels. Multiple level bilateral facet hypertrophy. Multiple level foraminal narrowing, most marked at C3-C4.   Upper chest: Lung apices are clear.  Carotid artery calcification.   Other: None   IMPRESSION: 1. No CT evidence for acute intracranial abnormality. Atrophy and small vessel ischemic changes of the white matter 2. Degenerative changes of the cervical spine. No acute fracture is seen.    I have personally reviewed and interpreted all radiology studies and my findings are as above.   VENTILATOR SETTINGS:    Cultures   Antimicrobials: Anti-infectives (From admission, onward)   Start     Stop   03/30/17 1630  Ampicillin-Sulbactam (UNASYN) 3 g in sodium chloride 0.9 % 100 mL IVPB     03/30/17 1703   03/30/17 1530  cefTRIAXone (ROCEPHIN) 2 g in dextrose 5 % 50 mL IVPB  Status:  Discontinued     03/30/17 1622       Devices    LINES / TUBES:      Continuous Infusions:  Physical Exam: Vitals:   03/30/17 1607 03/30/17 1615 03/30/17 1630 03/30/17 1715  BP: 135/89  132/87 (!) 150/86  Pulse: (!) 106 (!) 108 (!) 108 100  Resp: (!) 27 (!) 27 (!) 25 (!) 25  Temp:    97.9 F (36.6 C)  TempSrc:    Oral  SpO2: 98% 97% 98% 99%  Weight:        Wt Readings from Last 3 Encounters:  03/30/17 105 lb (47.6 kg)  03/16/17 105 lb  (47.6 kg)  09/17/16 107 lb 12.8 oz (48.9 kg)    General: Alert, does not follow commands, No acute respiratory distress, cachectic Neck:  Negative scars, masses, torticollis, lymphadenopathy, JVD Lungs: Clear to auscultation right lung fields, positive LUL rhonchi, negative  wheezes or crackles Cardiovascular: Tachycardic, Regular rhythm without murmur gallop or rub normal S1 and S2 Abdomen: negative abdominal pain, nondistended, positive soft, bowel sounds, no rebound, no ascites, no appreciable mass Extremities: No significant cyanosis, clubbing. LEFT hip decubitus ulcer stage III,  Psychiatric:  Unable to evaluate secondary to altered mental status  Central nervous system:  Eyes open does not follow commands. Mild withdrawal to painful stimuli. Unable to evaluate further secondary to altered mental status         Labs on Admission:  Basic Metabolic Panel: Recent Labs  Lab 03/30/17 1234  NA 160*  K 4.6  CL 122*  CO2 29  GLUCOSE 224*  BUN 83*  CREATININE 2.62*  CALCIUM 9.9   Liver Function Tests: Recent Labs  Lab 03/30/17 1234  AST 38  ALT 29  ALKPHOS 67  BILITOT 0.8  PROT 7.5  ALBUMIN 2.9*   No results for input(s): LIPASE, AMYLASE in the last 168 hours. No results for input(s): AMMONIA in the last 168 hours. CBC: Recent Labs  Lab 03/30/17 1234  WBC 23.2*  NEUTROABS 20.3*  HGB 13.4  HCT 43.2  MCV 99.5  PLT 253   Cardiac Enzymes: No results for input(s): CKTOTAL, CKMB, CKMBINDEX, TROPONINI in the last 168 hours.  BNP (last 3 results) No results for input(s): BNP in the last 8760 hours.  ProBNP (last 3 results) No results for input(s): PROBNP in the last 8760 hours.  CBG: Recent Labs  Lab 03/30/17 1344  GLUCAP 190*    Radiological Exams on Admission: Dg Chest 2 View  Result Date: 03/30/2017 CLINICAL DATA:  Cough. EXAM: CHEST  2 VIEW COMPARISON:  Radiographs of March 16, 2017. FINDINGS: The heart size and mediastinal contours are within normal  limits. Both lungs are clear. Atherosclerosis of thoracic aorta is noted. Mild bibasilar subsegmental atelectasis is noted The visualized skeletal structures are unremarkable. IMPRESSION: Mild bibasilar subsegmental atelectasis.  Aortic atherosclerosis. Electronically Signed   By: Marijo Conception, M.D.   On: 03/30/2017 14:14    EKG: Independently reviewed. Pending  Assessment/Plan Active Problems:   * No active hospital problems. *    Severe sepsis -Admit to SDU -Continue current antibiotics -Solu-Medrol 60 mg daily -Xopenex TID -Pulmonary toilet -D5-0.9% saline 152ml/hr:    Hypernatremic -See severe sepsis  CVA -Residual dysarthria/dysphasia -Patient to remain NPO  Chronic diastolic CHF -Strict I's and O's -Daily weight -Patient not on any home medication for CHF.  Pulmonary HTN -See CHF  HTN -See CHF  Diabetes Type 2 controlled with complication -0/32 Hemoglobin A1c= 5.8 -Sensitive SSI  HLD -Lipid panel pending    Code Status: DO NOT RESUSCITATE  (DVT Prophylaxis: Subcutaneous heparin Family Communication: Spoke with wife  Disposition Plan: TBD   Data Reviewed: Care during the described time interval was provided by me .  I have reviewed this patient's available data, including medical history, events of note, physical examination, and all test results as part of my evaluation.   Time spent: 60 min  [place], [facility] Pager [phone]

## 2017-03-30 NOTE — Progress Notes (Signed)
Pharmacy Antibiotic Note  Clinton [name] is a 83 y.o. male admitted on 03/30/2017 with pneumonia.  Pharmacy has been consulted for Unasyn dosing for aspiration PNA.  Unasyn 3 gm given in ED at 1633. WBC 10.6, wt 47.6 kg, Tmax 100.4, Creat 2.62 - it was 1.21 on 03/16/2017.  CrCl ~ 12 ml/min.    Plan: Unasyn 3 gm x 1 given in ED followed by Unasyn 1.5 gm IV q12h Pharmacy to sign off  Weight: 105 lb (47.6 kg)  Temp (24hrs), Avg:99.2 F (37.3 C), Min:97.9 F (36.6 C), Max:100.4 F (38 C)  Recent Labs  Lab 03/30/17 1234 03/30/17 1249 03/30/17 1759  WBC 23.2*  --  20.6*  CREATININE 2.62*  --   --   LATICACIDVEN  --  2.25*  --     CrCl cannot be calculated (Unknown ideal weight.).    No Known Allergies  Thank you for allowing pharmacy to be a part of this patient's care. Eudelia Bunch, Pharm.D. [phone] 03/30/2017 7:02 PM

## 2017-03-30 NOTE — ED Provider Notes (Signed)
  Physical Exam  BP 132/87   Pulse (!) 108   Temp (!) 100.4 F (38 C) (Rectal)   Resp (!) 25   Wt 47.6 kg (105 lb)   SpO2 98%   BMI 16.95 kg/m   Physical Exam  ED Course  Procedures  MDM   Assuming care of patient from Dr. Gilford Raid.   Patient in the ED for aspiration. Has hx of strokes. Workup thus far shows elevated sodium, elevated Cr, elevated WC.  Important pending results are none  According to Dr. Gilford Raid, plan is to admit. Code sepsis called, initial lactate is 2.25 --repeat lactate has been completed within appropriate time frame (and resulted at 5:00 pm) and it is 1.73.  Patient had no complains, no concerns from the nursing side. Will continue to monitor.     Varney Biles, MD 03/30/17 [phone]

## 2017-03-30 NOTE — ED Notes (Signed)
ED TO INPATIENT HANDOFF REPORT  Name/Age/Gender Clinton [name] 83 y.o. male  Code Status Code Status History    Date Active Date Inactive Code Status Order ID Comments User Context   07/13/2016 07:33 07/16/2016 15:15 DNR [id number]  Samella Parr, NP ED   07/13/2016 07:12 07/13/2016 07:33 Full Code [id number]  Samella Parr, NP ED   10/15/2013 19:28 10/17/2013 19:03 Full Code [id number]  Shanda Howells, MD ED    Questions for Most Recent Historical Code Status (Order [id number])    Question Answer Comment   In the event of cardiac or respiratory ARREST Do not call a "code blue"    In the event of cardiac or respiratory ARREST Do not perform Intubation, CPR, defibrillation or ACLS    In the event of cardiac or respiratory ARREST Use medication by any route, position, wound care, and other measures to relive pain and suffering. May use oxygen, suction and manual treatment of airway obstruction as needed for comfort.       Home/SNF/Other Home  Chief Complaint AMS  Level of Care/Admitting Diagnosis ED Disposition    ED Disposition Condition Comment   Admit  Hospital Area: [place] [[id number]]  Level of Care: Stepdown [14]  Admit to SDU based on following criteria: Respiratory Distress:  Frequent assessment and/or intervention to maintain adequate ventilation/respiration, pulmonary toilet, and respiratory treatment.  Admit to SDU based on following criteria: Severe physiological/psychological symptoms:  Any diagnosis requiring assessment & intervention at least every 4 hours on an ongoing basis to obtain desired patient outcomes including stability and rehabilitation  Diagnosis: Severe sepsis [facility]) [[id number]]  Admitting Physician: Allie Bossier [[id number]]  Attending Physician: Allie Bossier [[id number]]  Estimated length of stay: 5 - 7 days  Certification:: I certify this patient will need inpatient services for at least 2 midnights  PT Class (Do Not Modify): Inpatient  [101]  PT Acc Code (Do Not Modify): Private [1]       Medical History Past Medical History:  Diagnosis Date  . Dementia   . Diabetes mellitus without complication ([place])   . Hypertension   . Stroke [facility])     Allergies No Known Allergies  IV Location/Drains/Wounds Patient Lines/Drains/Airways Status   Active Line/Drains/Airways    Name:   Placement date:   Placement time:   Site:   Days:   Peripheral IV 03/30/17 Left Forearm   03/30/17    1234    Forearm   less than 1   Peripheral IV 03/30/17 Left Wrist   03/30/17    1345    Wrist   less than 1          Labs/Imaging Results for orders placed or performed during the hospital encounter of 03/30/17 (from the past 48 hour(s))  Comprehensive metabolic panel     Status: Abnormal   Collection Time: 03/30/17 12:34 PM  Result Value Ref Range   Sodium 160 (H) 135 - 145 mmol/L   Potassium 4.6 3.5 - 5.1 mmol/L   Chloride 122 (H) 101 - 111 mmol/L   CO2 29 22 - 32 mmol/L   Glucose, Bld 224 (H) 65 - 99 mg/dL   BUN 83 (H) 6 - 20 mg/dL   Creatinine, Ser 2.62 (H) 0.61 - 1.24 mg/dL   Calcium 9.9 8.9 - 10.3 mg/dL   Total Protein 7.5 6.5 - 8.1 g/dL   Albumin 2.9 (L) 3.5 - 5.0 g/dL   AST 38 15 - 41 U/L   ALT  29 17 - 63 U/L   Alkaline Phosphatase 67 38 - 126 U/L   Total Bilirubin 0.8 0.3 - 1.2 mg/dL   GFR calc non Af Amer 20 (L) >60 mL/min   GFR calc Af Amer 23 (L) >60 mL/min    Comment: (NOTE) The eGFR has been calculated using the CKD EPI equation. This calculation has not been validated in all clinical situations. eGFR's persistently <60 mL/min signify possible Chronic Kidney Disease.    Anion gap 9 5 - 15  CBC WITH DIFFERENTIAL     Status: Abnormal   Collection Time: 03/30/17 12:34 PM  Result Value Ref Range   WBC 23.2 (H) 4.0 - 10.5 K/uL   RBC 4.34 4.22 - 5.81 MIL/uL   Hemoglobin 13.4 13.0 - 17.0 g/dL   HCT 43.2 39.0 - 52.0 %   MCV 99.5 78.0 - 100.0 fL   MCH 30.9 26.0 - 34.0 pg   MCHC 31.0 30.0 - 36.0 g/dL   RDW 14.5  11.5 - 15.5 %   Platelets 253 150 - 400 K/uL   Neutrophils Relative % 88 %   Neutro Abs 20.3 (H) 1.7 - 7.7 K/uL   Lymphocytes Relative 7 %   Lymphs Abs 1.7 0.7 - 4.0 K/uL   Monocytes Relative 5 %   Monocytes Absolute 1.2 (H) 0.1 - 1.0 K/uL   Eosinophils Relative 0 %   Eosinophils Absolute 0.0 0.0 - 0.7 K/uL   Basophils Relative 0 %   Basophils Absolute 0.0 0.0 - 0.1 K/uL  I-Stat CG4 Lactic Acid, ED     Status: Abnormal   Collection Time: 03/30/17 12:49 PM  Result Value Ref Range   Lactic Acid, Venous 2.25 (HH) 0.5 - 1.9 mmol/L   Comment NOTIFIED PHYSICIAN   CBG monitoring, ED     Status: Abnormal   Collection Time: 03/30/17  1:44 PM  Result Value Ref Range   Glucose-Capillary 190 (H) 65 - 99 mg/dL  Urinalysis, Routine w reflex microscopic     Status: Abnormal   Collection Time: 03/30/17  3:32 PM  Result Value Ref Range   Color, Urine YELLOW YELLOW   APPearance CLOUDY (A) CLEAR   Specific Gravity, Urine 1.017 1.005 - 1.030   pH 5.0 5.0 - 8.0   Glucose, UA NEGATIVE NEGATIVE mg/dL   Hgb urine dipstick SMALL (A) NEGATIVE   Bilirubin Urine NEGATIVE NEGATIVE   Ketones, ur NEGATIVE NEGATIVE mg/dL   Protein, ur NEGATIVE NEGATIVE mg/dL   Nitrite NEGATIVE NEGATIVE   Leukocytes, UA NEGATIVE NEGATIVE   RBC / HPF 0-5 0 - 5 RBC/hpf   WBC, UA 0-5 0 - 5 WBC/hpf   Bacteria, UA FEW (A) NONE SEEN   Squamous Epithelial / LPF 0-5 (A) NONE SEEN   WBC Clumps PRESENT    Mucus PRESENT    Hyaline Casts, UA PRESENT    Dg Chest 2 View  Result Date: 03/30/2017 CLINICAL DATA:  Cough. EXAM: CHEST  2 VIEW COMPARISON:  Radiographs of March 16, 2017. FINDINGS: The heart size and mediastinal contours are within normal limits. Both lungs are clear. Atherosclerosis of thoracic aorta is noted. Mild bibasilar subsegmental atelectasis is noted The visualized skeletal structures are unremarkable. IMPRESSION: Mild bibasilar subsegmental atelectasis.  Aortic atherosclerosis. Electronically Signed   By: Marijo Conception, M.D.   On: 03/30/2017 14:14    Pending Labs Unresulted Labs (From admission, onward)   Start     Ordered   03/31/17 6314  Basic metabolic panel  Daily,  R     03/30/17 1741   03/31/17 0500  Magnesium  Daily,   R     03/30/17 1741   03/30/17 1750  Lipid panel  Once,   R     03/30/17 1749   03/30/17 1742  Culture, sputum-assessment  STAT,   R     03/30/17 1741   03/30/17 1735  CBC with Differential  STAT,   R     03/30/17 1741   03/30/17 1735  Comprehensive metabolic panel  STAT,   R     03/30/17 1741   03/30/17 1735  Lactic acid, plasma  STAT Now then every 3 hours,   STAT     03/30/17 1741   03/30/17 1735  Procalcitonin  STAT,   R     03/30/17 1741   03/30/17 1735  Protime-INR  STAT,   R     03/30/17 1741   03/30/17 1735  APTT  STAT,   R     03/30/17 1741   03/30/17 1610  Urine culture  STAT,   STAT     03/30/17 1609   03/30/17 1238  Blood Culture (routine x 2)  BLOOD CULTURE X 2,   STAT     03/30/17 1238      Vitals/Pain Today's Vitals   03/30/17 1630 03/30/17 1715 03/30/17 1730 03/30/17 1741  BP: 132/87 (!) 150/86 132/76   Pulse: (!) 108 100 (!) 102   Resp: (!) 25 (!) 25 (!) 26   Temp:  97.9 F (36.6 C)    TempSrc:  Oral    SpO2: 98% 99% 99%   Weight:      PainSc:    0-No pain    Isolation Precautions No active isolations  Medications Medications  dextrose 5 %-0.9 % sodium chloride infusion (not administered)  acetaminophen (TYLENOL) suppository 650 mg (650 mg Rectal Given 03/30/17 1248)  sodium chloride 0.9 % bolus 1,000 mL (0 mLs Intravenous Stopped 03/30/17 1312)    And  sodium chloride 0.9 % bolus 500 mL (0 mLs Intravenous Stopped 03/30/17 1330)  sodium chloride 0.9 % bolus 1,000 mL (0 mLs Intravenous Stopped 03/30/17 1804)  Ampicillin-Sulbactam (UNASYN) 3 g in sodium chloride 0.9 % 100 mL IVPB (0 g Intravenous Stopped 03/30/17 1703)    Mobility Bed-bound

## 2017-03-30 NOTE — ED Provider Notes (Signed)
[facility] DEPT Provider Note   CSN: [id number] Arrival date & time: 03/30/17  1208     History   Chief Complaint Chief Complaint  Patient presents with  . Altered Mental Status    fever    HPI Clinton [name] is a 83 y.o. male.  Pt presents to the ED today with altered mental status.  Pt lives with his wife who called EMS.  Pt has dementia and is nonverbal, so he is unable to provide any hx.  Per EMS, when they arrived, he was lying flat and had a big piece of french toast stuck in his throat.  They had to remove it with [place] forceps.  Pt was here on 11/12 for a fall with right wrist pain.  No fracture, but he was placed in a splint.  He did not have a fever then, no AMS.  CT head was done which showed no acute changes.      Past Medical History:  Diagnosis Date  . Dementia   . Diabetes mellitus without complication ([place])   . Hypertension   . Stroke [facility])     Patient Active Problem List   Diagnosis Date Noted  . Aphasia as late effect of cerebrovascular accident 08/18/2016  . Dysarthria 07/13/2016  . Aspiration pneumonia ([place]) 07/13/2016  . Leukocytosis 07/13/2016  . HLD (hyperlipidemia) 07/13/2016  . Ischemic stroke ([place])   . Recurrent aspiration bronchitis/pneumonia ([place])   . 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 Diabetes mellitus type 2 in nonobese ([place]) 03/04/2014  . Dysphagia 03/04/2014  . CVA (cerebral vascular accident) ([place]) 11/26/2013  . CVA (cerebral infarction) 10/15/2013    Past Surgical History:  Procedure Laterality Date  . CARDIAC CATHETERIZATION    . EYE SURGERY         Home Medications    Prior to Admission medications   Medication Sig Start Date End Date Taking? Authorizing Provider  atorvastatin (LIPITOR) 10 MG tablet TAKE 1 TABLET BY MOUTH DAILY. 10/01/14  Yes Rosalin Hawking, MD  clopidogrel (PLAVIX) 75 MG tablet TAKE 1 TABLET BY MOUTH ONCE A DAY 04/01/15  Yes Rosalin Hawking, MD  metFORMIN (GLUCOPHAGE) 500 MG tablet Take 500 mg by mouth 2 (two) times daily. 12/29/16  Yes [provider]  Multiple Vitamin (MULTIVITAMIN WITH MINERALS) TABS tablet Take 1 tablet by mouth daily.   Yes [provider]  Nutritional Supplements (FEEDING SUPPLEMENT, GLUCERNA 1.2 CAL,) LIQD Place 237 mLs into feeding tube daily.   Yes [provider]  Nutritional Supplements (NUTRITIONAL SUPPLEMENT PO) Take by mouth. House 2.0 - Med Pass 120 cc by mouth 2 times a day   Yes [provider]  vitamin C (ASCORBIC ACID) 500 MG tablet Take 500 mg by mouth daily.   Yes [provider]  zinc gluconate 50 MG tablet Take 50 mg by mouth daily.   Yes [provider]  aspirin EC 81 MG tablet Take 81 mg by mouth daily.    [provider]  cephALEXin (KEFLEX) 500 MG capsule Take 1 capsule (500 mg total) by mouth 3 (three) times daily. Patient not taking: Reported on 03/28/2017 03/16/17   Pattricia Boss, MD  famotidine (PEPCID) 20 MG tablet Take 1 tablet (20 mg total) by mouth daily. Patient not taking: Reported on 03/28/2017 07/16/16   Jonetta Osgood, MD    Family History Family History  Problem Relation Age of Onset  . Cancer Mother  Social History Social History   Tobacco Use  . Smoking status: Never Smoker  . Smokeless tobacco: Never Used  Substance Use Topics  . Alcohol use: No    Alcohol/week: 0.0 oz    Comment: 09/03/14 one glass of wine weekly  . Drug use: No     Allergies   Patient has no known allergies.   Review of Systems Review of Systems  Unable to perform ROS: Dementia     Physical Exam Updated Vital Signs BP 132/87   Pulse (!) 108   Temp (!) 100.4 F (38 C) (Rectal)   Resp (!) 25   Wt 47.6 kg (105 lb)   SpO2 98%   BMI 16.95 kg/m   Physical Exam  Constitutional: He appears well-developed and well-nourished.  HENT:  Head: Normocephalic and atraumatic.  Right Ear: External ear normal.    Left Ear: External ear normal.  Nose: Nose normal.  Mouth/Throat: Mucous membranes are dry.  Eyes: Conjunctivae and EOM are normal. Pupils are equal, round, and reactive to light.  Neck: Normal range of motion. Neck supple.  Cardiovascular: Normal heart sounds and normal pulses. Tachycardia present.  Pulmonary/Chest: Breath sounds normal. Tachypnea noted.  Abdominal: Soft. Bowel sounds are normal.  Musculoskeletal: Normal range of motion.  Right wrist in splint  Neurological: He is alert.  Pt is nonverbal, but will make eye contact  Skin: Skin is warm.  Nursing note and vitals reviewed.    ED Treatments / Results  Labs (all labs ordered are listed, but only abnormal results are displayed) Labs Reviewed  COMPREHENSIVE METABOLIC PANEL - Abnormal; Notable for the following components:      Result Value   Sodium 160 (*)    Chloride 122 (*)    Glucose, Bld 224 (*)    BUN 83 (*)    Creatinine, Ser 2.62 (*)    Albumin 2.9 (*)    GFR calc non Af Amer 20 (*)    GFR calc Af Amer 23 (*)    All other components within normal limits  CBC WITH DIFFERENTIAL/PLATELET - Abnormal; Notable for the following components:   WBC 23.2 (*)    Neutro Abs 20.3 (*)    Monocytes Absolute 1.2 (*)    All other components within normal limits  URINALYSIS, ROUTINE W REFLEX MICROSCOPIC - Abnormal; Notable for the following components:   APPearance CLOUDY (*)    Hgb urine dipstick SMALL (*)    Bacteria, UA FEW (*)    Squamous Epithelial / LPF 0-5 (*)    All other components within normal limits  CBG MONITORING, ED - Abnormal; Notable for the following components:   Glucose-Capillary 190 (*)    All other components within normal limits  I-STAT CG4 LACTIC ACID, ED - Abnormal; Notable for the following components:   Lactic Acid, Venous 2.25 (*)    All other components within normal limits  CULTURE, BLOOD (ROUTINE X 2)  CULTURE, BLOOD (ROUTINE X 2)  URINE CULTURE  I-STAT CG4 LACTIC ACID, ED     EKG  EKG Interpretation  Date/Time:  Wednesday March 30 2017 12:25:02 EST Ventricular Rate:  119 PR Interval:    QRS Duration: 125 QT Interval:  350 QTC Calculation: 493 R Axis:   -71 Text Interpretation:  Sinus tachycardia Multiple premature complexes, vent & supraven RBBB and LAFB Abnormal T, consider ischemia, lateral leads ST elevation, consider inferior injury No significant change since last tracing other than rate is faster. Confirmed by Isla Pence [phone]) on  03/30/2017 1:47:31 PM       Radiology Dg Chest 2 View  Result Date: 03/30/2017 CLINICAL DATA:  Cough. EXAM: CHEST  2 VIEW COMPARISON:  Radiographs of March 16, 2017. FINDINGS: The heart size and mediastinal contours are within normal limits. Both lungs are clear. Atherosclerosis of thoracic aorta is noted. Mild bibasilar subsegmental atelectasis is noted The visualized skeletal structures are unremarkable. IMPRESSION: Mild bibasilar subsegmental atelectasis.  Aortic atherosclerosis. Electronically Signed   By: Marijo Conception, M.D.   On: 03/30/2017 14:14   Ct Head Wo Contrast  Result Date: 03/28/2017 CLINICAL DATA:  Fall EXAM: CT HEAD WITHOUT CONTRAST CT CERVICAL SPINE WITHOUT CONTRAST TECHNIQUE: Multidetector CT imaging of the head and cervical spine was performed following the standard protocol without intravenous contrast. Multiplanar CT image reconstructions of the cervical spine were also generated. COMPARISON:  07/14/2016, 07/13/2016 FINDINGS: CT HEAD FINDINGS Brain: No acute territorial infarction, hemorrhage or intracranial mass is visualized. Moderate severe atrophy. Moderate small vessel ischemic changes of the white matter. Old lacunar infarcts within the thalamus and bilateral basal ganglia. Stable prominent ventricles felt secondary to atrophy Vascular: No hyperdense vessels. Vertebral and carotid calcification Skull: No fracture. Sinuses/Orbits: Mild mucosal thickening in the ethmoid sinuses. No acute  orbital abnormality. Mucous retention cysts in the maxillary sinuses Other: None CT CERVICAL SPINE FINDINGS Alignment: No subluxation.  Facet alignment is within normal limits. Skull base and vertebrae: No acute fracture. No primary bone lesion or focal pathologic process. Soft tissues and spinal canal: No prevertebral fluid or swelling. No visible canal hematoma. Disc levels: Moderate degenerative changes at C3-C4, and C4-C5, moderate severe degenerative changes at C6-C7. Anterior osteophytes at all levels. Multiple level bilateral facet hypertrophy. Multiple level foraminal narrowing, most marked at C3-C4. Upper chest: Lung apices are clear.  Carotid artery calcification. Other: None IMPRESSION: 1. No CT evidence for acute intracranial abnormality. Atrophy and small vessel ischemic changes of the white matter 2. Degenerative changes of the cervical spine. No acute fracture is seen. Electronically Signed   By: Donavan Foil M.D.   On: 03/28/2017 17:23   Ct Cervical Spine Wo Contrast  Result Date: 03/28/2017 CLINICAL DATA:  Fall EXAM: CT HEAD WITHOUT CONTRAST CT CERVICAL SPINE WITHOUT CONTRAST TECHNIQUE: Multidetector CT imaging of the head and cervical spine was performed following the standard protocol without intravenous contrast. Multiplanar CT image reconstructions of the cervical spine were also generated. COMPARISON:  07/14/2016, 07/13/2016 FINDINGS: CT HEAD FINDINGS Brain: No acute territorial infarction, hemorrhage or intracranial mass is visualized. Moderate severe atrophy. Moderate small vessel ischemic changes of the white matter. Old lacunar infarcts within the thalamus and bilateral basal ganglia. Stable prominent ventricles felt secondary to atrophy Vascular: No hyperdense vessels. Vertebral and carotid calcification Skull: No fracture. Sinuses/Orbits: Mild mucosal thickening in the ethmoid sinuses. No acute orbital abnormality. Mucous retention cysts in the maxillary sinuses Other: None CT  CERVICAL SPINE FINDINGS Alignment: No subluxation.  Facet alignment is within normal limits. Skull base and vertebrae: No acute fracture. No primary bone lesion or focal pathologic process. Soft tissues and spinal canal: No prevertebral fluid or swelling. No visible canal hematoma. Disc levels: Moderate degenerative changes at C3-C4, and C4-C5, moderate severe degenerative changes at C6-C7. Anterior osteophytes at all levels. Multiple level bilateral facet hypertrophy. Multiple level foraminal narrowing, most marked at C3-C4. Upper chest: Lung apices are clear.  Carotid artery calcification. Other: None IMPRESSION: 1. No CT evidence for acute intracranial abnormality. Atrophy and small vessel ischemic changes of the white  matter 2. Degenerative changes of the cervical spine. No acute fracture is seen. Electronically Signed   By: Donavan Foil M.D.   On: 03/28/2017 17:23    Procedures Procedures (including critical care time)  Medications Ordered in ED Medications  Ampicillin-Sulbactam (UNASYN) 3 g in sodium chloride 0.9 % 100 mL IVPB (3 g Intravenous New Bag/Given 03/30/17 1633)  acetaminophen (TYLENOL) suppository 650 mg (650 mg Rectal Given 03/30/17 1248)  sodium chloride 0.9 % bolus 1,000 mL (0 mLs Intravenous Stopped 03/30/17 1312)    And  sodium chloride 0.9 % bolus 500 mL (0 mLs Intravenous Stopped 03/30/17 1330)  sodium chloride 0.9 % bolus 1,000 mL (1,000 mLs Intravenous New Bag/Given 03/30/17 1625)     Initial Impression / Assessment and Plan / ED Course  I have reviewed the triage vital signs and the nursing notes.  Pertinent labs & imaging results that were available during my care of the patient were reviewed by me and considered in my medical decision making (see chart for details).   O2 sats 88% on RA, so pt placed on oxygen.  CRITICAL CARE Performed by: Isla Pence   Total critical care time: 30 minutes  Critical care time was exclusive of separately billable  procedures and treating other patients.  Critical care was necessary to treat or prevent imminent or life-threatening deterioration.  Critical care was time spent personally by me on the following activities: development of treatment plan with patient and/or surrogate as well as nursing, discussions with consultants, evaluation of patient's response to treatment, examination of patient, obtaining history from patient or surrogate, ordering and performing treatments and interventions, ordering and review of laboratory studies, ordering and review of radiographic studies, pulse oximetry and re-evaluation of patient's condition.  This patient meets SIRS Criteria and may be septic. SIRS = Systemic Inflammatory Response Syndrome  Best Practice Recommends:   Notify the nurse immediately to increase monitoring of patient.   The recent clinical data is shown below. Vitals:   03/30/17 1600 03/30/17 1607 03/30/17 1615 03/30/17 1630  BP: 135/89 135/89  132/87  Pulse: (!) 103 (!) 106 (!) 108 (!) 108  Resp: (!) 27 (!) 27 (!) 27 (!) 25  Temp:      TempSrc:      SpO2: 99% 98% 97% 98%  Weight:        Pt's HR and mentation has improved with IVFs.  CXR does not show pna, but I suspect pt sustained an aspiration pneumonia.  His wife said he has trouble swallowing since his stroke and he does not like the thickener.  The pt was also found with a large piece of french toast in his airway.  The pt given unasyn to treat for aspiration pneumonia.    Pt d/w hospitalist for admission.  Final Clinical Impressions(s) / ED Diagnoses   Final diagnoses:  Dehydration  Aspiration pneumonia, unspecified aspiration pneumonia type, unspecified laterality, unspecified part of lung ([place])  Hypernatremia  AKI (acute kidney injury) ([place])  Hypoxia  Sepsis, due to unspecified organism [facility])    ED Discharge Orders    None       Isla Pence, MD 04/02/17 0745

## 2017-03-30 NOTE — ED Notes (Signed)
Unable to obtain Blood culture x 2, no success.

## 2017-03-30 NOTE — Progress Notes (Signed)
CRITICAL VALUE ALERT  Critical Value:  Lactic acid 2.2; and Pt is MRSA PCR positive   Date & Time Notied:  03/30/2017 @ 0900  Provider Notified and contact precautions initiated

## 2017-03-31 ENCOUNTER — Inpatient Hospital Stay (HOSPITAL_COMMUNITY): Payer: Medicare Other

## 2017-03-31 DIAGNOSIS — N179 Acute kidney failure, unspecified: Secondary | ICD-10-CM

## 2017-03-31 DIAGNOSIS — F0151 Vascular dementia with behavioral disturbance: Secondary | ICD-10-CM

## 2017-03-31 DIAGNOSIS — E43 Unspecified severe protein-calorie malnutrition: Secondary | ICD-10-CM

## 2017-03-31 DIAGNOSIS — E860 Dehydration: Secondary | ICD-10-CM

## 2017-03-31 DIAGNOSIS — F01518 Vascular dementia, unspecified severity, with other behavioral disturbance: Secondary | ICD-10-CM

## 2017-03-31 DIAGNOSIS — L89223 Pressure ulcer of left hip, stage 3: Secondary | ICD-10-CM

## 2017-03-31 DIAGNOSIS — R7881 Bacteremia: Secondary | ICD-10-CM

## 2017-03-31 DIAGNOSIS — A4101 Sepsis due to Methicillin susceptible Staphylococcus aureus: Secondary | ICD-10-CM

## 2017-03-31 LAB — BLOOD CULTURE ID PANEL (REFLEXED)
ACINETOBACTER BAUMANNII: NOT DETECTED
CANDIDA ALBICANS: NOT DETECTED
CANDIDA KRUSEI: NOT DETECTED
CANDIDA PARAPSILOSIS: NOT DETECTED
Candida glabrata: NOT DETECTED
Candida tropicalis: NOT DETECTED
ENTEROBACTERIACEAE SPECIES: NOT DETECTED
ESCHERICHIA COLI: NOT DETECTED
Enterobacter cloacae complex: NOT DETECTED
Enterococcus species: NOT DETECTED
Haemophilus influenzae: NOT DETECTED
KLEBSIELLA OXYTOCA: NOT DETECTED
Klebsiella pneumoniae: NOT DETECTED
Listeria monocytogenes: NOT DETECTED
Methicillin resistance: DETECTED — AB
Neisseria meningitidis: NOT DETECTED
PSEUDOMONAS AERUGINOSA: NOT DETECTED
Proteus species: NOT DETECTED
SERRATIA MARCESCENS: NOT DETECTED
STREPTOCOCCUS PNEUMONIAE: NOT DETECTED
STREPTOCOCCUS PYOGENES: NOT DETECTED
Staphylococcus aureus (BCID): DETECTED — AB
Staphylococcus species: DETECTED — AB
Streptococcus agalactiae: NOT DETECTED
Streptococcus species: NOT DETECTED

## 2017-03-31 LAB — CBC WITH DIFFERENTIAL/PLATELET
BASOS ABS: 0 10*3/uL (ref 0.0–0.1)
BASOS PCT: 0 %
Eosinophils Absolute: 0 10*3/uL (ref 0.0–0.7)
Eosinophils Relative: 0 %
HEMATOCRIT: 37.5 % — AB (ref 39.0–52.0)
HEMOGLOBIN: 11.1 g/dL — AB (ref 13.0–17.0)
Lymphocytes Relative: 3 %
Lymphs Abs: 0.5 10*3/uL — ABNORMAL LOW (ref 0.7–4.0)
MCH: 29.8 pg (ref 26.0–34.0)
MCHC: 29.6 g/dL — ABNORMAL LOW (ref 30.0–36.0)
MCV: 100.8 fL — ABNORMAL HIGH (ref 78.0–100.0)
Monocytes Absolute: 0.5 10*3/uL (ref 0.1–1.0)
Monocytes Relative: 3 %
NEUTROS ABS: 15.8 10*3/uL — AB (ref 1.7–7.7)
NEUTROS PCT: 94 %
Platelets: 176 10*3/uL (ref 150–400)
RBC: 3.72 MIL/uL — ABNORMAL LOW (ref 4.22–5.81)
RDW: 14.3 % (ref 11.5–15.5)
WBC: 16.7 10*3/uL — AB (ref 4.0–10.5)

## 2017-03-31 LAB — ECHOCARDIOGRAM COMPLETE
Height: 68 in
Weight: 1622.5856 oz

## 2017-03-31 LAB — BASIC METABOLIC PANEL
BUN: 59 mg/dL — AB (ref 6–20)
CO2: 24 mmol/L (ref 22–32)
Calcium: 8.9 mg/dL (ref 8.9–10.3)
Chloride: >130 mmol/L (ref 101–111)
Creatinine, Ser: 1.78 mg/dL — ABNORMAL HIGH (ref 0.61–1.24)
GFR calc Af Amer: 37 mL/min — ABNORMAL LOW (ref >60)
GFR, EST NON AFRICAN AMERICAN: 32 mL/min — AB (ref >60)
GLUCOSE: 304 mg/dL — AB (ref 65–99)
Potassium: 4 mmol/L (ref 3.5–5.1)
SODIUM: 158 mmol/L — AB (ref 135–145)

## 2017-03-31 LAB — URINE CULTURE: Culture: NO GROWTH

## 2017-03-31 LAB — CG4 I-STAT (LACTIC ACID): Lactic Acid, Venous: 1.73 mmol/L (ref 0.5–1.9)

## 2017-03-31 LAB — PROCALCITONIN: PROCALCITONIN: 1.89 ng/mL

## 2017-03-31 LAB — LACTIC ACID, PLASMA
LACTIC ACID, VENOUS: 2.1 mmol/L — AB (ref 0.5–1.9)
Lactic Acid, Venous: 1.9 mmol/L (ref 0.5–1.9)

## 2017-03-31 LAB — MAGNESIUM: MAGNESIUM: 2.3 mg/dL (ref 1.7–2.4)

## 2017-03-31 LAB — GLUCOSE, CAPILLARY
GLUCOSE-CAPILLARY: 270 mg/dL — AB (ref 65–99)
GLUCOSE-CAPILLARY: 238 mg/dL — AB (ref 65–99)
GLUCOSE-CAPILLARY: 195 mg/dL — AB (ref 65–99)
GLUCOSE-CAPILLARY: 190 mg/dL — AB (ref 65–99)
Glucose-Capillary: 328 mg/dL — ABNORMAL HIGH (ref 65–99)
Glucose-Capillary: 278 mg/dL — ABNORMAL HIGH (ref 65–99)

## 2017-03-31 MED ORDER — LEVALBUTEROL HCL 1.25 MG/0.5ML IN NEBU: 1.2500 mg | INHALATION_SOLUTION | Freq: Three times a day (TID) | RESPIRATORY_TRACT | Status: DC

## 2017-03-31 MED ORDER — OSMOLITE 1.2 CAL PO LIQD: 1000.0000 mL | ORAL | Status: DC

## 2017-03-31 MED ORDER — VANCOMYCIN HCL 500 MG IV SOLR
500.0000 mg | INTRAVENOUS | Status: DC
  Filled 2017-03-31: qty 500

## 2017-03-31 MED ORDER — DEXTROSE-NACL 5-0.45 % IV SOLN
INTRAVENOUS | Status: DC
  Administered 2017-03-31 (×2): via INTRAVENOUS

## 2017-03-31 MED ORDER — ATORVASTATIN CALCIUM 10 MG PO TABS: 20.0000 mg | ORAL_TABLET | Freq: Every day | ORAL | Status: DC

## 2017-03-31 MED ORDER — SODIUM CHLORIDE 0.9 % IV SOLN: 3.0000 g | Freq: Once | INTRAVENOUS | Status: DC

## 2017-03-31 MED ORDER — HEPARIN SODIUM (PORCINE) 5000 UNIT/ML IJ SOLN: 5000.0000 [IU] | Freq: Three times a day (TID) | INTRAMUSCULAR | Status: DC

## 2017-03-31 MED ORDER — VANCOMYCIN HCL IN DEXTROSE 1-5 GM/200ML-% IV SOLN
1000.0000 mg | Freq: Once | INTRAVENOUS | Status: AC
  Administered 2017-03-31: 1000 mg via INTRAVENOUS
  Filled 2017-03-31: qty 200

## 2017-03-31 MED ORDER — SODIUM CHLORIDE 0.9 % IV SOLN
1.5000 g | Freq: Two times a day (BID) | INTRAVENOUS | Status: DC
  Administered 2017-03-31 – 2017-04-03 (×5): 1.5 g via INTRAVENOUS
  Filled 2017-03-31 (×8): qty 1.5

## 2017-03-31 MED ORDER — CHLORHEXIDINE GLUCONATE CLOTH 2 % EX PADS
6.0000 | MEDICATED_PAD | Freq: Every day | CUTANEOUS | Status: DC
  Administered 2017-03-31 – 2017-04-03 (×3): 6 via TOPICAL

## 2017-03-31 MED ORDER — JEVITY 1.2 CAL PO LIQD: 1000.0000 mL | ORAL | Status: DC

## 2017-03-31 MED ORDER — MUPIROCIN 2 % EX OINT
1.0000 "application " | TOPICAL_OINTMENT | Freq: Two times a day (BID) | CUTANEOUS | Status: DC
  Administered 2017-03-31 – 2017-04-03 (×8): 1 via NASAL
  Filled 2017-03-31 (×2): qty 22

## 2017-03-31 MED ORDER — INSULIN ASPART 100 UNIT/ML ~~LOC~~ SOLN
0.0000 [IU] | SUBCUTANEOUS | Status: DC
Start: 2017-03-31 — End: 2017-04-02
  Administered 2017-03-31: 4 [IU] via SUBCUTANEOUS
  Administered 2017-03-31: 11 [IU] via SUBCUTANEOUS
  Administered 2017-03-31: 15 [IU] via SUBCUTANEOUS
  Administered 2017-04-01: 3 [IU] via SUBCUTANEOUS
  Administered 2017-04-01 (×2): 4 [IU] via SUBCUTANEOUS
  Administered 2017-04-01: 11 [IU] via SUBCUTANEOUS
  Administered 2017-04-01: 4 [IU] via SUBCUTANEOUS
  Administered 2017-04-01: 11 [IU] via SUBCUTANEOUS
  Administered 2017-04-01: 4 [IU] via SUBCUTANEOUS
  Administered 2017-04-02: 3 [IU] via SUBCUTANEOUS
  Administered 2017-04-02: 4 [IU] via SUBCUTANEOUS
  Administered 2017-04-02: 7 [IU] via SUBCUTANEOUS

## 2017-03-31 MED ORDER — HEPARIN SODIUM (PORCINE) 5000 UNIT/ML IJ SOLN
5000.0000 [IU] | Freq: Two times a day (BID) | INTRAMUSCULAR | Status: DC
  Administered 2017-03-31 – 2017-04-01 (×3): 5000 [IU] via SUBCUTANEOUS
  Filled 2017-03-31 (×3): qty 1

## 2017-03-31 MED ORDER — LEVALBUTEROL HCL 1.25 MG/0.5ML IN NEBU
1.2500 mg | INHALATION_SOLUTION | Freq: Three times a day (TID) | RESPIRATORY_TRACT | Status: DC
  Administered 2017-03-31 – 2017-04-01 (×4): 1.25 mg via RESPIRATORY_TRACT
  Filled 2017-03-31 (×4): qty 0.5

## 2017-03-31 NOTE — Progress Notes (Addendum)
PHARMACY - PHYSICIAN COMMUNICATION CRITICAL VALUE ALERT - BLOOD CULTURE IDENTIFICATION (BCID)  Results for orders placed or performed during the hospital encounter of 03/30/17  Blood Culture ID Panel (Reflexed) (Collected: 03/30/2017 12:40 PM)  Result Value Ref Range   Enterococcus species NOT DETECTED NOT DETECTED   Listeria monocytogenes NOT DETECTED NOT DETECTED   Staphylococcus species DETECTED (A) NOT DETECTED   Staphylococcus aureus DETECTED (A) NOT DETECTED   Methicillin resistance DETECTED (A) NOT DETECTED   Streptococcus species NOT DETECTED NOT DETECTED   Streptococcus agalactiae NOT DETECTED NOT DETECTED   Streptococcus pneumoniae NOT DETECTED NOT DETECTED   Streptococcus pyogenes NOT DETECTED NOT DETECTED   Acinetobacter baumannii NOT DETECTED NOT DETECTED   Enterobacteriaceae species NOT DETECTED NOT DETECTED   Enterobacter cloacae complex NOT DETECTED NOT DETECTED   Escherichia coli NOT DETECTED NOT DETECTED   Klebsiella oxytoca NOT DETECTED NOT DETECTED   Klebsiella pneumoniae NOT DETECTED NOT DETECTED   Proteus species NOT DETECTED NOT DETECTED   Serratia marcescens NOT DETECTED NOT DETECTED   Haemophilus influenzae NOT DETECTED NOT DETECTED   Neisseria meningitidis NOT DETECTED NOT DETECTED   Pseudomonas aeruginosa NOT DETECTED NOT DETECTED   Candida albicans NOT DETECTED NOT DETECTED   Candida glabrata NOT DETECTED NOT DETECTED   Candida krusei NOT DETECTED NOT DETECTED   Candida parapsilosis NOT DETECTED NOT DETECTED   Candida tropicalis NOT DETECTED NOT DETECTED    Name of physician (or Provider) Contacted: Dr. Sherral Hammers  Changes to prescribed antibiotics required: start Vancomycin   Hershal Coria 03/31/2017  9:44 AM

## 2017-03-31 NOTE — Progress Notes (Signed)
CRITICAL VALUE ALERT  Critical Value:  Chloride- 130  Date & Time Notied:  03/31/17 @ 0450  Provider Notified

## 2017-03-31 NOTE — Progress Notes (Signed)
Initial Nutrition Assessment  DOCUMENTATION CODES:   Underweight(Will assess for malnutrition at follow-up)  INTERVENTION:  - Will order Osmolite 1.2 @ 15 mL/hr to increase by 10 mL every 12 hours to reach goal rate of Osmolite 1.2 @ 55 mL/hr. At goal rate, this regimen will provide 1584 kcal, 73 grams of protein, and 1082 mL free water.   Monitor magnesium, potassium, and phosphorus daily for at least 3 days, MD to replete as needed, as pt is at risk for refeeding syndrome given reports of poor intakes x1 year, severe sepsis and dehydration, underweight BMI.   NUTRITION DIAGNOSIS:   Inadequate oral intake related to inability to eat as evidenced by NPO status.  GOAL:   Patient will meet greater than or equal to 90% of their needs  MONITOR:   TF tolerance, Weight trends, Labs, Skin  REASON FOR ASSESSMENT:   Malnutrition Screening Tool, Low Braden, Consult Enteral/tube feeding initiation and management  ASSESSMENT:   83 y.o. 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 Presents to the ED with AMS. Pt lives with his wife who called EMS. Pt has dementia and is nonverbal. Per EMS, when they arrived, he was lying flat and had a big piece of french toast stuck in his throat. They had to remove it with forceps. Pt was here on 11/12 for a fall with right wrist pain. No fracture, but he was placed in a splint. CT head was done which showed no acute changes. Per wife at baseline patient able to nod yes and no questions, antra appropriately. Positive chronic dysphagia. Per wife, he has not been eating well last year.  BMI indicates underweight status. Pt has been NPO since admission. No family/visitors present at this time and pt noted to be alert/oriented to self; confirmed this with RN who also reports no family has been in yet today. Two RNs unable  to safely place Panda tube so plan for placement at bedside by another service versus pt being taken to IR for placement. RN reports attempts to contact MD to request Palliative Care consult. Notes state pt with severe sepsis.   Pt resting comfortably at time of visit and did not want to agitate him by attempting physical exam. Will attempt at follow-up. Per chart review, pt gained 5 lbs from 4/11-5/4. He subsequently lost 2 lbs from 5/4-10/31. Since 10/31, he has lost an additional 4 lbs (4% body weight) which is significant for time frame. Will need to monitor weight trends closely as some of this weight loss may be attributable to dehydration given report of poor intakes x1 year, need for high rate IVF at this time, and current Na and Cl values.   Medications reviewed; sliding scale Novolog, 60 mg Solu-medrol/day. Labs reviewed; CBGs: 270 and 238 mg/dL, Na: 158 mmol/L, Cl: >130 mmol/L, BUN: 59 mg/dL, creatinine: 1.78 mg/dL, GFR: 32 mL/min. IVF: D5-1/2 NS @ 125 mL/hr (510 kcal).     NUTRITION - FOCUSED PHYSICAL EXAM: Will attempt to perform/assess at follow-up.   Diet Order:  Diet NPO time specified  EDUCATION NEEDS:   No education needs have been identified at this time  Skin:  Skin Assessment: Skin Integrity Issues: Skin Integrity Issues:: Unstageable(full thickness) Unstageable: L leg  Last BM:  11/15  Height:   Ht Readings from Last 1 Encounters:  09/17/16 5\' 6"  (1.676 m)    Weight:  Wt Readings from Last 1 Encounters:  03/31/17 101 lb 6.6 oz (46 kg)    Ideal Body Weight:  64.54 kg  BMI:  Body mass index is 16.37 kg/m.  Estimated Nutritional Needs:   Kcal:  1472-1610 (32-35 kcal/kg)  Protein:  65-75 grams  Fluid:  >/= 1.8 L/day     Jarome Matin, MS, RD, LDN, [facility] Inpatient Clinical Dietitian Pager # [phone] After hours/weekend pager # [phone]

## 2017-03-31 NOTE — Consult Note (Signed)
[facility] Nurse wound consult note Reason for Consult: Left trochanter wound stage III Wound type: pressure Pressure Injury POA: Yes Measurement: 3cm x 3cm x 0.1cm Wound bed: 90% pale pink moist with 10% yellow slough Drainage (amount, consistency, odor) moderate yellow exudate Periwound: intact Dressing procedure/placement/frequency: I have provided nurses with orders for Silver Alginate (Aquacel Ag+)  for wound care on left trochanter. Foam protective dressing on sacrum. Prevalon boots for protection of heels. A mattress replacement has been ordered. Modified order to have delivered when pt transfers upstairs as the ICU beds are equipped with same features. Pt's lab work shows malnutrition, suggest supplement or Nutritional Consult, please order if you agree. We will not follow, but will remain available to this patient, to nursing, and the medical and/or surgical teams.  Please re-consult if we need to assist further.   Fara Olden, RN-C, WTA-C Wound Treatment Associate

## 2017-03-31 NOTE — Progress Notes (Signed)
ANTICOAGULATION CONSULT NOTE  Pharmacy Consult for sq heparin for VTE px Indication: VTE prophylaxis  No Known Allergies  Patient Measurements: Weight: 101 lb 6.6 oz (46 kg) Heparin Dosing Weight: 47.6 kg  Vital Signs: Temp: 97.8 F (36.6 C) (11/15 0300) Temp Source: Oral (11/15 0300) BP: 113/54 (11/15 0400) Pulse Rate: 82 (11/15 0400)  Labs: Recent Labs    03/30/17 1234 03/30/17 1759 03/31/17 0330  HGB 13.4 11.7*  --   HCT 43.2 39.0  --   PLT 253 204  --   APTT  --  32  --   LABPROT  --  15.0  --   INR  --  1.19  --   CREATININE 2.62* 2.15* 1.78*   CrCl cannot be calculated (Unknown ideal weight.).  Medical History: Past Medical History:  Diagnosis Date  . Dementia   . Diabetes mellitus without complication ([place])   . Hypertension   . Stroke [facility])    Assessment: 49 yoM in ED with AMS.  Pharmacy consulted to dose sq heparin for VTE prophylaxis 11/14 but order was not placed in error.  Re-consulted to place orders 11/15.  Wt 47.6 kg. CrCl<20 ml/min.  Goal of Therapy:  VTE px   Plan:  Heparin 5000 units sq q12h for VTE px Pharmacy to sign off  Hershal Coria, PharmD, BCPS Pager: [phone] 03/31/2017 7:52 AM

## 2017-03-31 NOTE — Progress Notes (Signed)
  Echocardiogram 2D Echocardiogram has been performed.  Gorman, Safi 03/31/2017, 3:59 PM

## 2017-03-31 NOTE — Care Management Note (Signed)
Case Management Note  Patient Details  Name: Clinton [name] MRN: [id number] Date of Birth: 06-16-1923  Subjective/Objective:                  ams  Action/Plan: Date: March 31, 2017 [name], BSN, [place], [place] Chart and notes review for patient progress and needs. Will follow for case management and discharge needs. Next review date: [id number]  Expected Discharge Date:  (unknown)               Expected Discharge Plan:  Home/Self Care  In-House Referral:     Discharge planning Services  CM Consult  Post Acute Care Choice:    Choice offered to:     DME Arranged:    DME Agency:     HH Arranged:    HH Agency:     Status of Service:  In process, will continue to follow  If discussed at Long Length of Stay Meetings, dates discussed:    Additional Comments:  Leeroy Cha, RN 03/31/2017, 8:30 AM

## 2017-03-31 NOTE — Consult Note (Signed)
Date of Admission:  03/30/2017          Reason for Consult:  MRSA bacteremia with severe sepsis Referring Provider: Terrilyn Saver "auto-consult" and Dr. Sherral Hammers   Assessment: 1. MRSA bacteremia with severe sepsis 2. Hip decubitus ulcer 3. Recent fall 4. Severe dementia due to CVA with dysphagia and dsyarthria  Plan: 1. IV vancomycin 2. Repeat blood cultures 3. TTE but would not do TEE in THIS patient 4. PALLIATIVE CARE CONSULT 5. WOC for hip wound which is prime candidate for source  Active Problems:   Severe sepsis ([place])   Pressure injury of skin   Scheduled Meds: . atorvastatin  20 mg Per NG tube q1800  . Chlorhexidine Gluconate Cloth  6 each Topical Q0600  . heparin subcutaneous  5,000 Units Subcutaneous Q12H  . insulin aspart  0-20 Units Subcutaneous Q4H  . levalbuterol  1.25 mg Nebulization TID  . methylPREDNISolone (SOLU-MEDROL) injection  60 mg Intravenous Daily  . mupirocin ointment  1 application Nasal BID   Continuous Infusions: . ampicillin-sulbactam (UNASYN) IV    . dextrose 5 % and 0.45% NaCl 125 mL/hr at 03/31/17 0623  . [START ON 04/02/2017] vancomycin     PRN Meds:.  HPI: Clinton [name] is a 83 y.o. male with past medical history significant for stroke due to thrombus of the basilar artery with residual dysphagia dysarthria and dementia.  Apparently he has not been eating well for more than a year and has been living at home largely bedbound and cared for by his wife who herself is 48 years old.  He was brought to the emergency department due to worsening confusion.  Patient's wife had called EMS and when they arrived he was lying flat with a piece of [place] toast stuck in his throat which they had to remove with forceps.  On arrival in the emergency department he appeared septic with low blood pressure, lactic acidosis, acute renal failure and significant leukocytosis.  He has been admitted to the intensive care unit.  Initially treated with Unasyn but his  blood cultures have grown methicillin-resistant Staphylococcus aureus by [facility] ID and vancomycin was added.  Note the mortality for Staphylococcus aureus bacteremia for all comers is 25% I think for this patient it is quite higher.  I also think that the likelihood of him being able to adhere to home IV antibiotics is going to be not very high. He remains critically ill but fortuanately not on pressors and not requiring a central  Line--which we need to AVOID.  I will treat him with IV vancomycin for now with a low threshold to switch over to daptomycin to avoid further renal insult. I have personally reviewed his CXR and he does not have evidence of pneumonia.  I would get a transthoracic echocardiogram but I would not has had a transesophageal echocardiogram in this patient who assuredly is not a candidate for cardiothoracic surgery.  I similarly do not think it is worth doing the procedure to try to determine length of therapy.  I think the most appropriate and pertinent thing that needs to be done is for a palliative care consult to be called because this patient's short-term and long-term prognosis is very poor.  If possible I would DC steroids or wean them when able.     Review of Systems: Review of Systems  Unable to perform ROS: Dementia    Past Medical History:  Diagnosis Date  . Dementia   . Diabetes mellitus without  complication ([place])   . Hypertension   . Stroke [facility])     Social History   Tobacco Use  . Smoking status: Never Smoker  . Smokeless tobacco: Never Used  Substance Use Topics  . Alcohol use: No    Alcohol/week: 0.0 oz    Comment: 09/03/14 one glass of wine weekly  . Drug use: No    Family History  Problem Relation Age of Onset  . Cancer Mother    No Known Allergies  OBJECTIVE: Blood pressure (!) 93/44, pulse 88, temperature 99.6 F (37.6 C), temperature source Oral, resp. rate (!) 27, weight 101 lb 6.6 oz (46 kg), SpO2 100 %.  Physical Exam    Constitutional: No distress.  HENT:  Head: Normocephalic.  Eyes: EOM are normal. Right eye exhibits no discharge. Left eye exhibits no discharge. No scleral icterus.  Neck: No tracheal deviation present.  Cardiovascular: Regular rhythm. Exam reveals no gallop and no friction rub.  No murmur heard. Pulmonary/Chest: Effort normal and breath sounds normal. No respiratory distress. He has no wheezes. He has no rales. He exhibits no tenderness.  Abdominal: Bowel sounds are normal. He exhibits no distension. There is no tenderness. There is no rebound.  Neurological: He is alert. He is disoriented.  Patient is nonverbal tracks with eyes  Skin: He is not diaphoretic.   He has ulcer on the left hip 03/31/17:      Lab Results Lab Results  Component Value Date   WBC 16.7 (H) 03/31/2017   HGB 11.1 (L) 03/31/2017   HCT 37.5 (L) 03/31/2017   MCV 100.8 (H) 03/31/2017   PLT 176 03/31/2017    Lab Results  Component Value Date   CREATININE 1.78 (H) 03/31/2017   BUN 59 (H) 03/31/2017   NA 158 (H) 03/31/2017   K 4.0 03/31/2017   CL >130 (HH) 03/31/2017   CO2 24 03/31/2017    Lab Results  Component Value Date   ALT 22 03/30/2017   AST 29 03/30/2017   ALKPHOS 56 03/30/2017   BILITOT 0.6 03/30/2017     Microbiology: Recent Results (from the past 240 hour(s))  Blood Culture (routine x 2)     Status: None (Preliminary result)   Collection Time: 03/30/17 12:40 PM  Result Value Ref Range Status   Specimen Description BLOOD LEFT FOREARM  Final   Special Requests   Final    BOTTLES DRAWN AEROBIC AND ANAEROBIC Blood Culture adequate volume   Culture  Setup Time   Final    GRAM POSITIVE COCCI AEROBIC BOTTLE ONLY CRITICAL RESULT CALLED TO, READ BACK BY AND VERIFIED WITH: N GLOGOVAC,PHARMD AT 0846 03/31/17 BY L BENFIELD Performed at [facility], [place] [address] [place], [place] [address]    Culture GRAM POSITIVE COCCI  Final   Report Status PENDING  Incomplete  Blood Culture ID  Panel (Reflexed)     Status: Abnormal   Collection Time: 03/30/17 12:40 PM  Result Value Ref Range Status   Enterococcus species NOT DETECTED NOT DETECTED Final   Listeria monocytogenes NOT DETECTED NOT DETECTED Final   Staphylococcus species DETECTED (A) NOT DETECTED Final    Comment: CRITICAL RESULT CALLED TO, READ BACK BY AND VERIFIED WITH: N GLOGOVAC,PHARMD AT 1884 03/31/17 BY L BENFIELD    Staphylococcus aureus DETECTED (A) NOT DETECTED Final    Comment: Methicillin (oxacillin)-resistant Staphylococcus aureus (MRSA). MRSA is predictably resistant to beta-lactam antibiotics (except ceftaroline). Preferred therapy is vancomycin unless clinically contraindicated. Patient requires contact precautions if  hospitalized. CRITICAL RESULT CALLED TO, READ BACK BY AND VERIFIED WITH: N GLOGOVAC,PHARMD AT 0846 11/15/18N BY L BENFIELD    Methicillin resistance DETECTED (A) NOT DETECTED Final    Comment: CRITICAL RESULT CALLED TO, READ BACK BY AND VERIFIED WITH: N GLOGOVAC,PHARMD AT 0349 03/31/17 BY L BENFIELD    Streptococcus species NOT DETECTED NOT DETECTED Final   Streptococcus agalactiae NOT DETECTED NOT DETECTED Final   Streptococcus pneumoniae NOT DETECTED NOT DETECTED Final   Streptococcus pyogenes NOT DETECTED NOT DETECTED Final   Acinetobacter baumannii NOT DETECTED NOT DETECTED Final   Enterobacteriaceae species NOT DETECTED NOT DETECTED Final   Enterobacter cloacae complex NOT DETECTED NOT DETECTED Final   Escherichia coli NOT DETECTED NOT DETECTED Final   Klebsiella oxytoca NOT DETECTED NOT DETECTED Final   Klebsiella pneumoniae NOT DETECTED NOT DETECTED Final   Proteus species NOT DETECTED NOT DETECTED Final   Serratia marcescens NOT DETECTED NOT DETECTED Final   Haemophilus influenzae NOT DETECTED NOT DETECTED Final   Neisseria meningitidis NOT DETECTED NOT DETECTED Final   Pseudomonas aeruginosa NOT DETECTED NOT DETECTED Final   Candida albicans NOT DETECTED NOT DETECTED Final    Candida glabrata NOT DETECTED NOT DETECTED Final   Candida krusei NOT DETECTED NOT DETECTED Final   Candida parapsilosis NOT DETECTED NOT DETECTED Final   Candida tropicalis NOT DETECTED NOT DETECTED Final    Comment: Performed at [facility], [place]. [address] [place], [place] [address]  MRSA PCR Screening     Status: Abnormal   Collection Time: 03/30/17  8:00 PM  Result Value Ref Range Status   MRSA by PCR POSITIVE (A) NEGATIVE Final    Comment:        The GeneXpert MRSA Assay (FDA approved for NASAL specimens only), is one component of a comprehensive MRSA colonization surveillance program. It is not intended to diagnose MRSA infection nor to guide or monitor treatment for MRSA infections. RESULT CALLED TO, READ BACK BY AND VERIFIED WITH: Donato Schultz [id number] @ 2126 BY J Turley, [place] for [place] [phone] pager   [phone] cell 03/31/2017, 12:04 PM

## 2017-03-31 NOTE — Progress Notes (Signed)
Dr Sherral Hammers notifed that panda tube was not able to be inserted by RN due to patient coughing.Also notifed of lactic acid 2.1 Orders received.

## 2017-03-31 NOTE — Progress Notes (Signed)
PROGRESS NOTE    Clinton [name]  HWE:[id number] DOB: [birth year] DOA: 03/30/2017 PCP: Wenda Low, MD     Brief Narrative:  83 y.o. 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 Per wife has not been eating well last year, dementia, Diabetes Type 2 with complication, Chronic Diastolic CHF (Echocardiogram 07/13/2016 EF 55-60%: PA peak pressure 33 mmHg),, Pulmonary HTN, Essential HTN, HLD,   Presents to the ED today with altered mental status. Pt lives with his wife who called EMS. Pt has dementia and is nonverbal, so he is unable to provide any hx. Per EMS, when they arrived, he was lying flat and had a big piece of french toast stuck in his throat. They had to remove it with [place]ills forceps. Pt was here on 11/12 for a fall with right wrist pain. No fracture, but he was placed in a splint. He did not have a fever then, no AMS. CT head was done which showed no acute changes.  Per wife at baseline patient able to nod yes and no questions, antra appropriately. Positive chronic dysphagia.    Subjective: 11/15 patient more alert today states negative CP, negative SOB, negative abdominal pain. Still not very communicative. Follows some commands.    Assessment & Plan:   Active Problems:   Severe sepsis (HCC)   Pressure injury of skin   Severe sepsis/positive MRSA Bacteremia/Aspiration pneumonia -EMS upon arrival removed [place] toast from patient's throat. Continue treatment for aspiration pneumonia. -Blood culture positive for MRSA. -Continue current antibiotics for minimum of 2 weeks -Solu-Medrol 60 mg daily -Xopenex TID -Pulmonary toilet -D5-0.45% saline 118ml/hr:   -PCXR 11/16   Hypernatremia -See severe sepsis -Improving with hydration. -Once CorTrak tube placed will start additional free water.    CVA (residual dysphasia) -Residual dysarthria/dysphasia -Patient to remain NPO  Severe protein calorie Malnutrition -11/15 Requested placement  of CorTrak tube -Consult nutrition for enteral feeding   Chronic diastolic CHF -Strict I's and O's since admission +2.1 L -Daily weight Filed Weights   03/30/17 1521 03/31/17 0349  Weight: 105 lb (47.6 kg) 101 lb 6.6 oz (46 kg)  -Patient not on any home medication for CHF. -Echocardiogram pending -Patient will require TEE when he is stronger. Have spoken with [facility] tentatively for Friday or Monday.   Pulmonary HTN -See CHF   HTN -See CHF   Diabetes Type 2 controlled with complication -8/93 Hemoglobin A1c= 5.8 -11/15 increase resistant SSI    HLD -Lipid panel within ADA/AHA guidelines -Lipitor 20 mg daily once CorTrak tube placed   Left hip decubitus ulcer stage III -11/15 consulted wound care placed. Await recommendations -Request for air mattress place. Ensure on rotation at all times     DVT prophylaxis: Subcutaneous heparin Code Status: DO NOT RESUSCITATE Family Communication: None Disposition Plan: TBD   Consultants:  None    Procedures/Significant Events:  11/12 CT head WO contrast/CT C-spine WO contrast:-Negative subluxation.-Facet alignment WNL alignment Alignment: No subluxation.  Facet alignment is within normal limits. -Negative acute vertebral fracture   -Moderate multilevel DJD within C-spine, with foraminal narrowing most markedly at C3-C4      I have personally reviewed and interpreted all radiology studies and my findings are as above.  VENTILATOR SETTINGS:    Cultures 11/14 blood positive MRSA 11/14 MRSA by PCR positive    Antimicrobials:  Anti-infectives (From admission, onward)   Start     Stop   04/02/17 1000  vancomycin (VANCOCIN) 500 mg in sodium  chloride 0.9 % 100 mL IVPB         03/31/17 1045  Ampicillin-Sulbactam (UNASYN) 3 g in sodium chloride 0.9 % 100 mL IVPB         03/31/17 0900  vancomycin (VANCOCIN) IVPB 1000 mg/200 mL premix     03/31/17 1020   03/31/17 0600  ampicillin-sulbactam (UNASYN) 1.5 g in  sodium chloride 0.9 % 50 mL IVPB  Status:  Discontinued     03/31/17 0856   03/30/17 1630  Ampicillin-Sulbactam (UNASYN) 3 g in sodium chloride 0.9 % 100 mL IVPB     03/30/17 1703   03/30/17 1530  cefTRIAXone (ROCEPHIN) 2 g in dextrose 5 % 50 mL IVPB  Status:  Discontinued     03/30/17 1622       Devices   LINES / TUBES:      Continuous Infusions: . ampicillin-sulbactam (UNASYN) IV    . dextrose 5 % and 0.45% NaCl 125 mL/hr at 03/31/17 0623  . [START ON 04/02/2017] vancomycin       Objective: Vitals:   03/31/17 0349 03/31/17 0400 03/31/17 0800 03/31/17 0835  BP:  (!) 113/54    Pulse:  82    Resp:      Temp:   99.6 F (37.6 C)   TempSrc:   Oral   SpO2:  100%  96%  Weight: 101 lb 6.6 oz (46 kg)       Intake/Output Summary (Last 24 hours) at 03/31/2017 1037 Last data filed at 03/31/2017 0900 Gross per 24 hour  Intake 2806.25 ml  Output 641 ml  Net 2165.25 ml   Filed Weights   03/30/17 1521 03/31/17 0349  Weight: 105 lb (47.6 kg) 101 lb 6.6 oz (46 kg)    Examination:  General: Alert, follows some commands, No acute respiratory distress, cachectic Neck:  Negative scars, masses, torticollis, lymphadenopathy, JVD Lungs: Clear to auscultation right lung fields, positive LUL rhonchi, negative  wheezes or crackles Cardiovascular: Tachycardic, Regular rhythm without murmur gallop or rub normal S1 and S2 Abdomen: negative abdominal pain, nondistended, positive soft, bowel sounds, no rebound, no ascites, no appreciable mass Extremities: No significant cyanosis, clubbing. LEFT hip decubitus ulcer stage III,  Psychiatric:  Unable to evaluate secondary to altered mental status  Central nervous system:  Eyes open follow some commands. Spontaneously moves extremities. Answers some questions. .     Data Reviewed: Care during the described time interval was provided by me .  I have reviewed this patient's available data, including medical history, events of note, physical  examination, and all test results as part of my evaluation.  CBC: Recent Labs  Lab 03/30/17 1234 03/30/17 1759 03/31/17 0756  WBC 23.2* 20.6* 16.7*  NEUTROABS 20.3* 18.0* 15.8*  HGB 13.4 11.7* 11.1*  HCT 43.2 39.0 37.5*  MCV 99.5 100.8* 100.8*  PLT 253 204 951   Basic Metabolic Panel: Recent Labs  Lab 03/30/17 1234 03/30/17 1759 03/31/17 0330  NA 160* 158* 158*  K 4.6 4.1 4.0  CL 122* 128* >130*  CO2 29 25 24   GLUCOSE 224* 200* 304*  BUN 83* 67* 59*  CREATININE 2.62* 2.15* 1.78*  CALCIUM 9.9 8.6* 8.9  MG  --   --  2.3   GFR: CrCl cannot be calculated (Unknown ideal weight.). Liver Function Tests: Recent Labs  Lab 03/30/17 1234 03/30/17 1759  AST 38 29  ALT 29 22  ALKPHOS 67 56  BILITOT 0.8 0.6  PROT 7.5 6.4*  ALBUMIN 2.9* 2.4*  No results for input(s): LIPASE, AMYLASE in the last 168 hours. No results for input(s): AMMONIA in the last 168 hours. Coagulation Profile: Recent Labs  Lab 03/30/17 1759  INR 1.19   Cardiac Enzymes: No results for input(s): CKTOTAL, CKMB, CKMBINDEX, TROPONINI in the last 168 hours. BNP (last 3 results) No results for input(s): PROBNP in the last 8760 hours. HbA1C: No results for input(s): HGBA1C in the last 72 hours. CBG: Recent Labs  Lab 03/30/17 1344 03/30/17 2124 03/30/17 2325 03/31/17 0313 03/31/17 0833  GLUCAP 190* 229* 240* 270* 238*   Lipid Profile: Recent Labs    03/30/17 1759  CHOL 113  HDL 26*  LDLCALC 60  TRIG 135  CHOLHDL 4.3   Thyroid Function Tests: No results for input(s): TSH, T4TOTAL, FREET4, T3FREE, THYROIDAB in the last 72 hours. Anemia Panel: No results for input(s): VITAMINB12, FOLATE, FERRITIN, TIBC, IRON, RETICCTPCT in the last 72 hours. Sepsis Labs: Recent Labs  Lab 03/30/17 1706 03/30/17 1759 03/30/17 2009 03/31/17 0756  PROCALCITON  --  1.53  --  1.89  LATICACIDVEN 1.73 1.4 2.2* 1.9    Recent Results (from the past 240 hour(s))  Blood Culture (routine x 2)     Status:  None (Preliminary result)   Collection Time: 03/30/17 12:40 PM  Result Value Ref Range Status   Specimen Description BLOOD LEFT FOREARM  Final   Special Requests   Final    BOTTLES DRAWN AEROBIC AND ANAEROBIC Blood Culture adequate volume   Culture  Setup Time   Final    GRAM POSITIVE COCCI AEROBIC BOTTLE ONLY CRITICAL RESULT CALLED TO, READ BACK BY AND VERIFIED WITH: N GLOGOVAC,PHARMD AT 0846 03/31/17 BY L BENFIELD Performed at [facility], [place] [address], [place], [place] [address]    Culture GRAM POSITIVE COCCI  Final   Report Status PENDING  Incomplete  Blood Culture ID Panel (Reflexed)     Status: Abnormal   Collection Time: 03/30/17 12:40 PM  Result Value Ref Range Status   Enterococcus species NOT DETECTED NOT DETECTED Final   Listeria monocytogenes NOT DETECTED NOT DETECTED Final   Staphylococcus species DETECTED (A) NOT DETECTED Final    Comment: CRITICAL RESULT CALLED TO, READ BACK BY AND VERIFIED WITH: N GLOGOVAC,PHARMD AT 3295 03/31/17 BY L BENFIELD    Staphylococcus aureus DETECTED (A) NOT DETECTED Final    Comment: Methicillin (oxacillin)-resistant Staphylococcus aureus (MRSA). MRSA is predictably resistant to beta-lactam antibiotics (except ceftaroline). Preferred therapy is vancomycin unless clinically contraindicated. Patient requires contact precautions if  hospitalized. CRITICAL RESULT CALLED TO, READ BACK BY AND VERIFIED WITH: N GLOGOVAC,PHARMD AT 0846 11/15/18N BY L BENFIELD    Methicillin resistance DETECTED (A) NOT DETECTED Final    Comment: CRITICAL RESULT CALLED TO, READ BACK BY AND VERIFIED WITH: N GLOGOVAC,PHARMD AT 1884 03/31/17 BY L BENFIELD    Streptococcus species NOT DETECTED NOT DETECTED Final   Streptococcus agalactiae NOT DETECTED NOT DETECTED Final   Streptococcus pneumoniae NOT DETECTED NOT DETECTED Final   Streptococcus pyogenes NOT DETECTED NOT DETECTED Final   Acinetobacter baumannii NOT DETECTED NOT DETECTED Final    Enterobacteriaceae species NOT DETECTED NOT DETECTED Final   Enterobacter cloacae complex NOT DETECTED NOT DETECTED Final   Escherichia coli NOT DETECTED NOT DETECTED Final   Klebsiella oxytoca NOT DETECTED NOT DETECTED Final   Klebsiella pneumoniae NOT DETECTED NOT DETECTED Final   Proteus species NOT DETECTED NOT DETECTED Final   Serratia marcescens NOT DETECTED NOT DETECTED Final   Haemophilus influenzae  NOT DETECTED NOT DETECTED Final   Neisseria meningitidis NOT DETECTED NOT DETECTED Final   Pseudomonas aeruginosa NOT DETECTED NOT DETECTED Final   Candida albicans NOT DETECTED NOT DETECTED Final   Candida glabrata NOT DETECTED NOT DETECTED Final   Candida krusei NOT DETECTED NOT DETECTED Final   Candida parapsilosis NOT DETECTED NOT DETECTED Final   Candida tropicalis NOT DETECTED NOT DETECTED Final    Comment: Performed at [facility], [place] [address], [place], [place] [address]  MRSA PCR Screening     Status: Abnormal   Collection Time: 03/30/17  8:00 PM  Result Value Ref Range Status   MRSA by PCR POSITIVE (A) NEGATIVE Final    Comment:        The GeneXpert MRSA Assay (FDA approved for NASAL specimens only), is one component of a comprehensive MRSA colonization surveillance program. It is not intended to diagnose MRSA infection nor to guide or monitor treatment for MRSA infections. RESULT CALLED TO, READ BACK BY AND VERIFIED WITH: Donato Schultz [id number] @ 2126 BY J SCOTTON          Radiology Studies: Dg Chest 2 View  Result Date: 03/30/2017 CLINICAL DATA:  Cough. EXAM: CHEST  2 VIEW COMPARISON:  Radiographs of March 16, 2017. FINDINGS: The heart size and mediastinal contours are within normal limits. Both lungs are clear. Atherosclerosis of thoracic aorta is noted. Mild bibasilar subsegmental atelectasis is noted The visualized skeletal structures are unremarkable. IMPRESSION: Mild bibasilar subsegmental atelectasis.  Aortic atherosclerosis.  Electronically Signed   By: Marijo Conception, M.D.   On: 03/30/2017 14:14        Scheduled Meds: . atorvastatin  20 mg Per NG tube q1800  . Chlorhexidine Gluconate Cloth  6 each Topical Q0600  . heparin subcutaneous  5,000 Units Subcutaneous Q12H  . insulin aspart  0-20 Units Subcutaneous Q4H  . levalbuterol  1.25 mg Nebulization TID  . methylPREDNISolone (SOLU-MEDROL) injection  60 mg Intravenous Daily  . mupirocin ointment  1 application Nasal BID   Continuous Infusions: . ampicillin-sulbactam (UNASYN) IV    . dextrose 5 % and 0.45% NaCl 125 mL/hr at 03/31/17 0623  . [START ON 04/02/2017] vancomycin       LOS: 1 day    Time spent:40 min    Petula Rotolo, Geraldo Docker, MD [facility] Pager [phone]  If 7PM-7AM, please contact night-coverage [email or web address] Password [facility] 03/31/2017, 10:37 AM

## 2017-03-31 NOTE — Progress Notes (Signed)
Bladder scan showed 267cc. Condom catheter drained 125cc.Foley not inserted at this time.

## 2017-03-31 NOTE — Progress Notes (Addendum)
Pharmacy Antibiotic Note  Clinton [name] is a 83 y.o. male admitted on 03/30/2017 with bacteremia.  Rapid blood culture identifying organism as MRSA.  Pharmacy has been consulted for vancomycin dosing.  Currently on day #2 Unasyn for suspected aspiration pneumonia.  Plan: Vancomycin 1000 mg x 1 then 500 mg q48h. Daily SCr.  Weight: 101 lb 6.6 oz (46 kg)  Temp (24hrs), Avg:98.6 F (37 C), Min:97.8 F (36.6 C), Max:100.4 F (38 C)  Recent Labs  Lab 03/30/17 1234 03/30/17 1249 03/30/17 1759 03/30/17 2009 03/31/17 0330  WBC 23.2*  --  20.6*  --   --   CREATININE 2.62*  --  2.15*  --  1.78*  LATICACIDVEN  --  2.25* 1.4 2.2*  --     CrCl cannot be calculated (Unknown ideal weight.).    No Known Allergies  Antimicrobials this admission:  11/14 Unasyn >>  11/15 Vancomycin >>  Dose adjustments this admission:   Microbiology results:  11/14 BCx: GPC growing in 1 bottle so far (BCID = MRSA, [facility] alerted of result on 11/15) 11/14 UCx:  11/14 MRSA PCR: +  Thank you for allowing pharmacy to be a part of this patient's care.  Hershal Coria 03/31/2017 8:03 AM

## 2017-04-01 ENCOUNTER — Inpatient Hospital Stay (HOSPITAL_COMMUNITY): Payer: Medicare Other

## 2017-04-01 DIAGNOSIS — N179 Acute kidney failure, unspecified: Secondary | ICD-10-CM

## 2017-04-01 DIAGNOSIS — E870 Hyperosmolality and hypernatremia: Secondary | ICD-10-CM

## 2017-04-01 DIAGNOSIS — R7881 Bacteremia: Secondary | ICD-10-CM

## 2017-04-01 LAB — BASIC METABOLIC PANEL
ANION GAP: 4 — AB (ref 5–15)
BUN: 47 mg/dL — AB (ref 6–20)
CHLORIDE: 127 mmol/L — AB (ref 101–111)
CO2: 24 mmol/L (ref 22–32)
Calcium: 9.1 mg/dL (ref 8.9–10.3)
Creatinine, Ser: 1.45 mg/dL — ABNORMAL HIGH (ref 0.61–1.24)
GFR calc Af Amer: 47 mL/min — ABNORMAL LOW (ref >60)
GFR calc non Af Amer: 41 mL/min — ABNORMAL LOW (ref >60)
Glucose, Bld: 186 mg/dL — ABNORMAL HIGH (ref 65–99)
POTASSIUM: 3.3 mmol/L — AB (ref 3.5–5.1)
SODIUM: 155 mmol/L — AB (ref 135–145)

## 2017-04-01 LAB — GLUCOSE, CAPILLARY
GLUCOSE-CAPILLARY: 158 mg/dL — AB (ref 65–99)
GLUCOSE-CAPILLARY: 145 mg/dL — AB (ref 65–99)
Glucose-Capillary: 196 mg/dL — ABNORMAL HIGH (ref 65–99)
Glucose-Capillary: 181 mg/dL — ABNORMAL HIGH (ref 65–99)
Glucose-Capillary: 300 mg/dL — ABNORMAL HIGH (ref 65–99)
Glucose-Capillary: 275 mg/dL — ABNORMAL HIGH (ref 65–99)

## 2017-04-01 LAB — C-REACTIVE PROTEIN: CRP: 7.4 mg/dL — AB (ref <1.0)

## 2017-04-01 LAB — SEDIMENTATION RATE: SED RATE: 74 mm/h — AB (ref 0–16)

## 2017-04-01 LAB — PROCALCITONIN: PROCALCITONIN: 0.55 ng/mL

## 2017-04-01 LAB — MAGNESIUM: MAGNESIUM: 2 mg/dL (ref 1.7–2.4)

## 2017-04-01 MED ORDER — METHYLPREDNISOLONE SODIUM SUCC 40 MG IJ SOLR
40.0000 mg | Freq: Every day | INTRAMUSCULAR | Status: AC
  Administered 2017-04-02 – 2017-04-03 (×2): 40 mg via INTRAVENOUS
  Filled 2017-04-01 (×2): qty 1

## 2017-04-01 MED ORDER — LEVALBUTEROL HCL 1.25 MG/0.5ML IN NEBU
1.2500 mg | INHALATION_SOLUTION | Freq: Two times a day (BID) | RESPIRATORY_TRACT | Status: DC
  Administered 2017-04-01 – 2017-04-02 (×2): 1.25 mg via RESPIRATORY_TRACT
  Filled 2017-04-01 (×3): qty 0.5

## 2017-04-01 MED ORDER — HEPARIN SODIUM (PORCINE) 5000 UNIT/ML IJ SOLN
5000.0000 [IU] | Freq: Two times a day (BID) | INTRAMUSCULAR | Status: DC
  Administered 2017-04-01 – 2017-04-03 (×4): 5000 [IU] via SUBCUTANEOUS
  Filled 2017-04-01 (×4): qty 1

## 2017-04-01 MED ORDER — ATORVASTATIN CALCIUM 20 MG PO TABS
20.0000 mg | ORAL_TABLET | Freq: Every day | ORAL | Status: DC
  Administered 2017-04-01 – 2017-04-02 (×2): 20 mg via ORAL
  Filled 2017-04-01: qty 1
  Filled 2017-04-01: qty 2

## 2017-04-01 MED ORDER — POTASSIUM CL IN DEXTROSE 5% 20 MEQ/L IV SOLN
20.0000 meq | INTRAVENOUS | Status: DC
  Administered 2017-04-01 – 2017-04-03 (×3): 20 meq via INTRAVENOUS
  Filled 2017-04-01 (×4): qty 1000

## 2017-04-01 NOTE — Progress Notes (Signed)
Nutrition Follow-up  DOCUMENTATION CODES:   Non-severe (moderate) malnutrition in context of chronic illness, Underweight  INTERVENTION:  - Will order Magic Cup TID with meals, each supplement provides 290 kcal and 9 grams of protein - Tech/RN to assist with meals.  - RD will continue to monitor for needs.   NUTRITION DIAGNOSIS:   Moderate Malnutrition related to chronic illness, lethargy/confusion, dysphagia as evidenced by severe fat depletion, severe muscle depletion, moderate fat depletion, moderate muscle depletion. -revised.   GOAL:   Patient will meet greater than or equal to 90% of their needs -unmet   MONITOR:   PO intake, Supplement acceptance, Weight trends, Labs, Skin  ASSESSMENT:   83 y.o. 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 Presents to the ED with AMS. Pt lives with his wife who called EMS. Pt has dementia and is nonverbal. Per EMS, when they arrived, he was lying flat and had a big piece of french toast stuck in his throat. They had to remove it with forceps. Pt was here on 11/12 for a fall with right wrist pain. No fracture, but he was placed in a splint. CT head was done which showed no acute changes. Per wife at baseline patient able to nod yes and no questions, antra appropriately. Positive chronic dysphagia. Per wife, he has not been eating well last year.  11/16 Pt was evaluated by SLP this AM and at 11:24 AM diet was advanced from NPO to Dysphagia 1, honey-thick liquids. No intakes since that time. Per chart review, pt has gained 11 lbs/4.4 kg since yesterday which is likely fluid related/resusitation given dehydration. Will continue to monitor weight trends closely.   Medications reviewed; sliding scale Novolog, 60 mg Solu-medrol/day. Labs reviewed; CBGs: 196 and 158 mg/dL today, Na: 155 mmol/L, Cl: 127 mmol/L,  K: 3.3 mmol/L, BUN: 47 mg/dL, creatinine: 1.45 mg/dL, GFR: 41 mL/min.  IVF: D5-20 mEq KCl @ 75 mL/hr (306 kcal).    11/15 - Pt has been NPO since admission.  - Pt is non-verbal and bedbound. - Two RNs unable to safely place Panda tube so plan for placement at bedside by another service versus pt being taken to IR for placement.  - Notes state pt with severe sepsis.  - Per chart review, pt gained 5 lbs from 4/11-5/4. - He subsequently lost 2 lbs from 5/4-10/31.  - Since 10/31, he has lost an additional 4 lbs (4% body weight) which is significant for time frame.  - Will need to monitor weight trends closely as some of this weight loss may be attributable to dehydration given report of poor intakes x1 year, need for high rate IVF at this time, and current Na and Cl values.     Diet Order:  DIET - DYS 1 Room service appropriate? Yes; Fluid consistency: Honey Thick  EDUCATION NEEDS:   No education needs have been identified at this time  Skin:  Skin Assessment: Skin Integrity Issues: Skin Integrity Issues:: Unstageable(full thickness) Unstageable: L leg  Last BM:  11/16  Height:   Ht Readings from Last 1 Encounters:  03/31/17 5\' 8"  (1.727 m)    Weight:   Wt Readings from Last 1 Encounters:  04/01/17 111 lb 1.8 oz (50.4 kg)    Ideal Body Weight:  64.54 kg  BMI:  Body mass index is 16.89 kg/m.  Estimated Nutritional Needs:   Kcal:  1472-1610 (  32-35 kcal/kg)  Protein:  65-75 grams  Fluid:  >/= 1.8 L/day     Jarome Matin, MS, RD, LDN, [facility] Inpatient Clinical Dietitian Pager # [phone] After hours/weekend pager # [phone]

## 2017-04-01 NOTE — Evaluation (Addendum)
Clinical/Bedside Swallow Evaluation Patient Details  Name: Clinton [name] MRN: [id number] Date of Birth: April 29, 1924  Today's Date: 04/01/2017 Time: SLP Start Time (ACUTE ONLY): 0955 SLP Stop Time (ACUTE ONLY): 1030 SLP Time Calculation (min) (ACUTE ONLY): 35 min  Past Medical History:  Past Medical History:  Diagnosis Date  . Dementia   . Diabetes mellitus without complication ([place])   . Hypertension   . Stroke [facility])    Past Surgical History:  Past Surgical History:  Procedure Laterality Date  . CARDIAC CATHETERIZATION    . EYE SURGERY     HPI:  83 year old male admitted 03/30/17 with severe sepsis. PMH significant for chronic dysphagia and high aspiration risk, CVA, DM, HTN. CXR indicates patchy BLL atelectasis, no edema or consolidation   Assessment / Plan / Recommendation Clinical Impression  Oral care was completed with suction. Pt's lingual surface was noted to be coated with thick whiteish substance not removed with suction swab. Slight standing secretions noted in lateral sulci and under tongue. Gag elicited several times during oral care.   Pt accepted trials of thin liquid, nectar and honey thick liquid, and puree consistencies. Orally, pt exhibited extended oral prep and propulsion of all consistencies, increasing aspiration risk and likelihood of rapid fatigue with po intake. Immediate cough response and drop in O2 sats noted following the swallow of thin and nectar thick liquids. No overt s/s aspiration with honey thick or puree consistencies, however, pt has multiiple high risk indicators, including hx dementia, CVA, advanced age, and chronic dysphagia with high aspiration risk. Pt is unable to follow verbal commands consistently for safe swallow precautions (swallow again, clear throat, etc)   At this time, recommend conservative diet of puree (Dys 1 ) and honey thick liquids if within pt/family/MD wishes, with adherence to posted safe swallow precautions to mitigate (not  eliminate) aspiration risk. Also recommend consideration of Palliative Care Consult to establish goals of care.   SLP Visit Diagnosis: Dysphagia, oropharyngeal phase (R13.12)    Aspiration Risk  Severe aspiration risk    Diet Recommendation Dysphagia 1 (Puree);Honey-thick liquid   Liquid Administration via: Cup;Straw Medication Administration: Crushed with puree Supervision: Staff to assist with self feeding;Full supervision/cueing for compensatory strategies Compensations: Slow rate;Small sips/bites;Minimize environmental distractions Postural Changes: Seated upright at 90 degrees;Remain upright for at least 30 minutes after po intake    Other  Recommendations Oral Care Recommendations: Oral care before and after PO Other Recommendations: Order thickener from pharmacy;Have oral suction available   Follow up Recommendations 24 hour supervision/assistance      Frequency and Duration min 1 x/week  2 weeks;1 week       Prognosis Prognosis for Safe Diet Advancement: Guarded Barriers to Reach Goals: Time post onset;Behavior;Severity of deficits      Swallow Study   General Date of Onset: 03/30/17 HPI: 83 year old male admitted 03/30/17 with severe sepsis. PMH significant for chronic dysphagia and high aspiration risk, CVA, DM, HTN. CXR indicates patchy BLL atelectasis, no edema or consolidation Type of Study: Bedside Swallow Evaluation Previous Swallow Assessment: MBS June abd August 2015, BSE February 2018 - mod-sev aspiration risk, rec reg/thin Diet Prior to this Study: NPO Temperature Spikes Noted: No Respiratory Status: Nasal cannula History of Recent Intubation: No Behavior/Cognition: Alert;Cooperative;Doesn't follow directions;Requires cueing Oral Cavity Assessment: Other (comment)(coating of lingual surface) Oral Cavity - Dentition: Adequate natural dentition Vision: Functional for self-feeding Self-Feeding Abilities: Total assist(restraint mitts in place) Patient  Positioning: Upright in bed Baseline Vocal Quality: Low  vocal intensity Volitional Cough: Cognitively unable to elicit Volitional Swallow: Unable to elicit    Oral/Motor/Sensory Function Overall Oral Motor/Sensory Function: (generalized weakness)   Ice Chips Ice chips: Not tested   Thin Liquid Thin Liquid: Impaired Presentation: Cup;Straw Oral Phase Functional Implications: Oral holding;Prolonged oral transit Pharyngeal  Phase Impairments: Suspected delayed Swallow;Cough - Immediate;Change in Vital Signs    Nectar Thick Nectar Thick Liquid: Impaired Presentation: Cup;Straw Oral phase functional implications: Oral holding;Prolonged oral transit Pharyngeal Phase Impairments: Suspected delayed Swallow;Cough - Immediate;Change in Vital Signs   Honey Thick Honey Thick Liquid: Within functional limits Presentation: Cup   Puree Puree: Within functional limits Presentation: Spoon   Solid   GO   Solid: Not tested       Clinton [name], [facility], [place] Speech Language Pathologist [phone]  Clinton [name], Clinton [name] 04/01/2017,10:40 AM

## 2017-04-01 NOTE — Progress Notes (Signed)
Subjective:  He is not verbalizing complaints but is able to answer questions   Antibiotics:  Anti-infectives (From admission, onward)   Start     Dose/Rate Route Frequency Ordered Stop   04/02/17 1000  vancomycin (VANCOCIN) 500 mg in sodium chloride 0.9 % 100 mL IVPB     500 mg 100 mL/hr over 60 Minutes Intravenous Every 48 hours 03/31/17 0855     03/31/17 1800  ampicillin-sulbactam (UNASYN) 1.5 g in sodium chloride 0.9 % 50 mL IVPB     1.5 g 100 mL/hr over 30 Minutes Intravenous Every 12 hours 03/31/17 1057     03/31/17 1045  Ampicillin-Sulbactam (UNASYN) 3 g in sodium chloride 0.9 % 100 mL IVPB  Status:  Discontinued     3 g 200 mL/hr over 30 Minutes Intravenous  Once 03/31/17 1036 03/31/17 1056   03/31/17 0900  vancomycin (VANCOCIN) IVPB 1000 mg/200 mL premix     1,000 mg 200 mL/hr over 60 Minutes Intravenous  Once 03/31/17 0850 03/31/17 1020   03/31/17 0600  ampicillin-sulbactam (UNASYN) 1.5 g in sodium chloride 0.9 % 50 mL IVPB  Status:  Discontinued     1.5 g 100 mL/hr over 30 Minutes Intravenous Every 12 hours 03/30/17 1905 03/31/17 0856   03/30/17 1630  Ampicillin-Sulbactam (UNASYN) 3 g in sodium chloride 0.9 % 100 mL IVPB     3 g 200 mL/hr over 30 Minutes Intravenous  Once 03/30/17 1622 03/30/17 1703   03/30/17 1530  cefTRIAXone (ROCEPHIN) 2 g in dextrose 5 % 50 mL IVPB  Status:  Discontinued     2 g 100 mL/hr over 30 Minutes Intravenous  Once 03/30/17 1525 03/30/17 1622      Medications: Scheduled Meds: . atorvastatin  20 mg Per NG tube q1800  . Chlorhexidine Gluconate Cloth  6 each Topical Q0600  . heparin subcutaneous  5,000 Units Subcutaneous Q12H  . insulin aspart  0-20 Units Subcutaneous Q4H  . levalbuterol  1.25 mg Nebulization BID  . methylPREDNISolone (SOLU-MEDROL) injection  60 mg Intravenous Daily  . mupirocin ointment  1 application Nasal BID   Continuous Infusions: . ampicillin-sulbactam (UNASYN) IV Stopped (04/01/17 0501)  . dextrose 5  % with KCl 20 mEq / L 20 mEq (04/01/17 1200)  . [START ON 04/02/2017] vancomycin     PRN Meds:.    Objective: Weight change: -9.4 oz (-1.628 kg)  Intake/Output Summary (Last 24 hours) at 04/01/2017 1458 Last data filed at 04/01/2017 1326 Gross per 24 hour  Intake 2520 ml  Output 575 ml  Net 1945 ml   Blood pressure 111/62, pulse (!) 51, temperature 98.4 F (36.9 C), temperature source Oral, resp. rate (!) 23, height 5\' 8"  (1.727 m), weight 111 lb 1.8 oz (50.4 kg), SpO2 98 %. Temp:  [97.6 F (36.4 C)-99 F (37.2 C)] 98.4 F (36.9 C) (11/16 1200) Pulse Rate:  [51-101] 51 (11/16 0917) Resp:  [18-23] 23 (11/16 1200) BP: (97-136)/(46-71) 111/62 (11/16 1200) SpO2:  [92 %-100 %] 98 % (11/16 1200) Weight:  [111 lb 1.8 oz (50.4 kg)] 111 lb 1.8 oz (50.4 kg) (11/16 0324)  Physical Exam: General:confused but arousable and answers some questions HEENT: anicteric sclera, EOMI CVS bradycardia normal r,  no murmur rubs or gallops Chest: clear to auscultation bilaterally, no wheezing, rales or rhonchi Abdomen: soft nontender, nondistended, normal bowel sounds, Extremities: ulcer not examined today  Neuro: nonfocal  CBC:  CBC Latest Ref Rng & Units 03/31/2017 03/30/2017 03/30/2017  WBC 4.0 - 10.5 K/uL 16.7(H) 20.6(H) 23.2(H)  Hemoglobin 13.0 - 17.0 g/dL 11.1(L) 11.7(L) 13.4  Hematocrit 39.0 - 52.0 % 37.5(L) 39.0 43.2  Platelets 150 - 400 K/uL 176 204 253     BMET Recent Labs    03/31/17 0330 04/01/17 0350  NA 158* 155*  K 4.0 3.3*  CL >130* 127*  CO2 24 24  GLUCOSE 304* 186*  BUN 59* 47*  CREATININE 1.78* 1.45*  CALCIUM 8.9 9.1     Liver Panel  Recent Labs    03/30/17 1234 03/30/17 1759  PROT 7.5 6.4*  ALBUMIN 2.9* 2.4*  AST 38 29  ALT 29 22  ALKPHOS 67 56  BILITOT 0.8 0.6       Sedimentation Rate Recent Labs    04/01/17 0350  ESRSEDRATE 74*   C-Reactive Protein Recent Labs    04/01/17 0350  CRP 7.4*    Micro Results: Recent Results (from  the past 720 hour(s))  Aerobic Culture (superficial specimen)     Status: None   Collection Time: 03/11/17 11:30 AM  Result Value Ref Range Status   Specimen Description BONE  Final   Special Requests L ISCHIAL TUBEROSITY NO BONE SENT IN SWAB  Final   Gram Stain   Final    RARE WBC PRESENT, PREDOMINANTLY PMN MODERATE GRAM POSITIVE COCCI IN PAIRS IN CLUSTERS Performed at [facility], [place] [address], [place], [place] [address]    Culture   Final    ABUNDANT METHICILLIN RESISTANT STAPHYLOCOCCUS AUREUS   Report Status 03/14/2017 FINAL  Final   Organism ID, Bacteria METHICILLIN RESISTANT STAPHYLOCOCCUS AUREUS  Final      Susceptibility   Methicillin resistant staphylococcus aureus - MIC*    CIPROFLOXACIN >=8 RESISTANT Resistant     ERYTHROMYCIN >=8 RESISTANT Resistant     GENTAMICIN <=0.5 SENSITIVE Sensitive     OXACILLIN >=4 RESISTANT Resistant     TETRACYCLINE <=1 SENSITIVE Sensitive     VANCOMYCIN 1 SENSITIVE Sensitive     TRIMETH/SULFA <=10 SENSITIVE Sensitive     CLINDAMYCIN <=0.25 SENSITIVE Sensitive     RIFAMPIN <=0.5 SENSITIVE Sensitive     Inducible Clindamycin NEGATIVE Sensitive     * ABUNDANT METHICILLIN RESISTANT STAPHYLOCOCCUS AUREUS  Urine culture     Status: None   Collection Time: 03/16/17  4:20 PM  Result Value Ref Range Status   Specimen Description URINE, CLEAN CATCH  Final   Special Requests NONE  Final   Culture NO GROWTH  Final   Report Status 03/17/2017 FINAL  Final  Blood Culture (routine x 2)     Status: Abnormal (Preliminary result)   Collection Time: 03/30/17 12:40 PM  Result Value Ref Range Status   Specimen Description BLOOD LEFT FOREARM  Final   Special Requests   Final    BOTTLES DRAWN AEROBIC AND ANAEROBIC Blood Culture adequate volume   Culture  Setup Time   Final    GRAM POSITIVE COCCI AEROBIC BOTTLE ONLY CRITICAL RESULT CALLED TO, READ BACK BY AND VERIFIED WITH: N GLOGOVAC,PHARMD AT 0846 03/31/17 BY L BENFIELD    Culture (A)  Final      STAPHYLOCOCCUS AUREUS SUSCEPTIBILITIES TO FOLLOW Performed at [facility], [place] [address] [place],  [address]    Report Status PENDING  Incomplete  Blood Culture ID Panel (Reflexed)     Status: Abnormal   Collection Time: 03/30/17 12:40 PM  Result Value Ref Range Status   Enterococcus species NOT DETECTED NOT DETECTED  Final   Listeria monocytogenes NOT DETECTED NOT DETECTED Final   Staphylococcus species DETECTED (A) NOT DETECTED Final    Comment: CRITICAL RESULT CALLED TO, READ BACK BY AND VERIFIED WITH: N GLOGOVAC,PHARMD AT 1610 03/31/17 BY L BENFIELD    Staphylococcus aureus DETECTED (A) NOT DETECTED Final    Comment: Methicillin (oxacillin)-resistant Staphylococcus aureus (MRSA). MRSA is predictably resistant to beta-lactam antibiotics (except ceftaroline). Preferred therapy is vancomycin unless clinically contraindicated. Patient requires contact precautions if  hospitalized. CRITICAL RESULT CALLED TO, READ BACK BY AND VERIFIED WITH: N GLOGOVAC,PHARMD AT 0846 11/15/18N BY L BENFIELD    Methicillin resistance DETECTED (A) NOT DETECTED Final    Comment: CRITICAL RESULT CALLED TO, READ BACK BY AND VERIFIED WITH: N GLOGOVAC,PHARMD AT 9604 03/31/17 BY L BENFIELD    Streptococcus species NOT DETECTED NOT DETECTED Final   Streptococcus agalactiae NOT DETECTED NOT DETECTED Final   Streptococcus pneumoniae NOT DETECTED NOT DETECTED Final   Streptococcus pyogenes NOT DETECTED NOT DETECTED Final   Acinetobacter baumannii NOT DETECTED NOT DETECTED Final   Enterobacteriaceae species NOT DETECTED NOT DETECTED Final   Enterobacter cloacae complex NOT DETECTED NOT DETECTED Final   Escherichia coli NOT DETECTED NOT DETECTED Final   Klebsiella oxytoca NOT DETECTED NOT DETECTED Final   Klebsiella pneumoniae NOT DETECTED NOT DETECTED Final   Proteus species NOT DETECTED NOT DETECTED Final   Serratia marcescens NOT DETECTED NOT DETECTED Final   Haemophilus influenzae NOT  DETECTED NOT DETECTED Final   Neisseria meningitidis NOT DETECTED NOT DETECTED Final   Pseudomonas aeruginosa NOT DETECTED NOT DETECTED Final   Candida albicans NOT DETECTED NOT DETECTED Final   Candida glabrata NOT DETECTED NOT DETECTED Final   Candida krusei NOT DETECTED NOT DETECTED Final   Candida parapsilosis NOT DETECTED NOT DETECTED Final   Candida tropicalis NOT DETECTED NOT DETECTED Final    Comment: Performed at [facility], [place]. [address], [place], [place] [address]  Blood Culture (routine x 2)     Status: None (Preliminary result)   Collection Time: 03/30/17  1:58 PM  Result Value Ref Range Status   Specimen Description BLOOD RIGHT ANTECUBITAL  Final   Special Requests   Final    BOTTLES DRAWN AEROBIC AND ANAEROBIC Blood Culture adequate volume   Culture   Final    NO GROWTH 2 DAYS Performed at [facility], [place] [address] [place], [place] [address]    Report Status PENDING  Incomplete  Urine culture     Status: None   Collection Time: 03/30/17  3:00 PM  Result Value Ref Range Status   Specimen Description URINE, CATHETERIZED  Final   Special Requests NONE  Final   Culture   Final    NO GROWTH Performed at [facility], [address] [place], [place] [address]    Report Status 03/31/2017 FINAL  Final  MRSA PCR Screening     Status: Abnormal   Collection Time: 03/30/17  8:00 PM  Result Value Ref Range Status   MRSA by PCR POSITIVE (A) NEGATIVE Final    Comment:        The GeneXpert MRSA Assay (FDA approved for NASAL specimens only), is one component of a comprehensive MRSA colonization surveillance program. It is not intended to diagnose MRSA infection nor to guide or monitor treatment for MRSA infections. RESULT CALLED TO, READ BACK BY AND VERIFIED WITH: Donato Schultz [id number] @ 2126 BY J SCOTTON   Culture, blood (Routine X 2) w  Reflex to ID Panel     Status: None (Preliminary result)   Collection Time: 03/31/17 11:50 AM  Result  Value Ref Range Status   Specimen Description BLOOD RIGHT ANTECUBITAL  Final   Special Requests   Final    BOTTLES DRAWN AEROBIC AND ANAEROBIC Blood Culture adequate volume   Culture   Final    NO GROWTH < 24 HOURS Performed at [facility], [address], [place], [place] [address]    Report Status PENDING  Incomplete  Culture, blood (Routine X 2) w Reflex to ID Panel     Status: None (Preliminary result)   Collection Time: 03/31/17 11:50 AM  Result Value Ref Range Status   Specimen Description BLOOD BLOOD RIGHT FOREARM  Final   Special Requests   Final    BOTTLES DRAWN AEROBIC AND ANAEROBIC Blood Culture results may not be optimal due to an excessive volume of blood received in culture bottles   Culture   Final    NO GROWTH < 24 HOURS Performed at [facility], [place] [address], [place], [place] [address]    Report Status PENDING  Incomplete    Studies/Results: Dg Chest Port 1 View  Result Date: 04/01/2017 CLINICAL DATA:  Difficulty breathing with concern for aspiration EXAM: PORTABLE CHEST 1 VIEW COMPARISON:  March 30, 2017 FINDINGS: There is patchy bibasilar atelectasis. There is no frank edema or consolidation. Heart is mildly enlarged with pulmonary vascularity within normal limits. No adenopathy. There is aortic atherosclerosis. There is degenerative change in each shoulder with superior migration of each humeral head. Bones are osteoporotic. IMPRESSION: Patchy bibasilar atelectasis. No edema or consolidation. Stable cardiac prominence. There is aortic atherosclerosis. There are chronic rotator cuff tears bilaterally, characterized by superior migration of each humeral head. Bones are osteoporotic. Aortic Atherosclerosis (ICD10-I70.0). Electronically Signed   By: Lowella Grip III M.D.   On: 04/01/2017 07:19      Assessment/Plan:  INTERVAL HISTORY: improved cognition   Active Problems:   Severe sepsis (HCC)   Decubitus ulcer of left upper thigh, stage  III (HCC)   AKI (acute kidney injury) ([place])   Dehydration   MRSA bacteremia   Staphylococcus aureus bacteremia with sepsis [facility])   Vascular dementia with behavior disturbance    Clinton [name] is a 83 y.o. male with  Severe vascular dementia, ulcer, recent fall and admission with severe sepsis due to MRSA bacteremia.  #1 MRSA SAB WITH SEPSIS  . MORTALITY FOR THIS CONDITION IS VERY HIGH ESP GIVEN HIS CO MORBIDITIES   Strongly recommend palliative care consult to consider his prognosis and treatment options  How is he going to get through 4-6 weeks of IV antibiotics ?     [place] Antimicrobial Management Team Staphylococcus aureus bacteremia   Staphylococcus aureus bacteremia (SAB) is associated with a high rate of complications and mortality.  Specific aspects of clinical management are critical to optimizing the outcome of patients with SAB.  Therefore, the [facility] Antimicrobial Management Team [facility]) has initiated an intervention aimed at improving the management of SAB at [facility].  To do so, Infectious Diseases physicians are providing an evidence-based consult for the management of all patients with SAB.     Yes No Comments  Perform follow-up blood cultures (even if the patient is afebrile) to ensure clearance of bacteremia [x]  []    Remove vascular catheter and obtain follow-up blood cultures after the removal of the catheter [x]  []  DO NOT PLACE CENTRAL OR  PICC LINE  Perform echocardiography to evaluate for endocarditis (transthoracic ECHO is 40-50% sensitive, TEE is > 90% sensitive) []  []  Please keep in mind, that neither test can definitively EXCLUDE endocarditis, and that should clinical suspicion remain high for endocarditis the patient should then still be treated with an "endocarditis" duration of therapy = 6 weeks  WOULD NOT HAZARD TEE IN THIS PT  Consult electrophysiologist to evaluate implanted cardiac device (pacemaker, ICD) []  []    Ensure source control []  []   Have all abscesses been drained effectively? Have deep seeded infections (septic joints or osteomyelitis) had appropriate surgical debridement?  Likely via skin  Investigate for "metastatic" sites of infection []  []  Does the patient have ANY symptom or physical exam finding that would suggest a deeper infection (back or neck pain that may be suggestive of vertebral osteomyelitis or epidural abscess, muscle pain that could be a symptom of pyomyositis)?  Keep in mind that for deep seeded infections MRI imaging with contrast is preferred rather than other often insensitive tests such as plain x-rays, especially early in a patient's presentation.  Difficult to tell  Change antibiotic therapy to vancomycin []  []  Beta-lactam antibiotics are preferred for MSSA due to higher cure rates.   If on Vancomycin, goal trough should be 15 - 20 mcg/mL  Estimated duration of IV antibiotic therapy:  6 weeks []  []  Consult case management for probably prolonged outpatient IV antibiotic therapy    I will be checking on his cultures this weekend and available for questions.     LOS: 2 days   Alcide Evener 04/01/2017, 2:58 PM

## 2017-04-01 NOTE — Plan of Care (Signed)
  SLP Dysphagia Goals Patient will utilize recommended strategies Description Patient will utilize recommended strategies during swallow to increase swallowing safety with 04/01/2017 1051 by Colon Flattery B, CCC-SLP Flowsheets Taken 04/01/2017 1051  Patient will utilize recommended strategies during swallow to increase swallowing safety with  mod assist;max assist (to mitigate aspiration risk)

## 2017-04-01 NOTE — Progress Notes (Signed)
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  His wife called EMS and when they arrived the patient was lying flat and had a big piece of [place] toast stuck in his throat which had to be removed with forceps.  Per the wife, at baseline the patient is able to nod yes and no to answer questions and he answers questions appropriately.  He was found to have MRSA bacteremia without evidence of endocarditis on a transthoracic echocardiogram.  His likely source was a greater trochanter ulcer.  Infectious disease has been consulted and are assisting with management.  Assessment & Plan:   Active Problems:   Severe sepsis ([place])   Decubitus ulcer of left upper thigh, stage III (HCC)   AKI (acute kidney injury) ([place])   Dehydration   MRSA bacteremia   Staphylococcus aureus bacteremia with sepsis [facility])   Vascular dementia with behavior disturbance  Severe sepsis/positive MRSA Bacteremia/Aspiration pneumonia -EMS upon arrival removed [place] toast from patient's throat. Continue treatment for aspiration pneumonia. -Blood culture positive for MRSA -  Repeat blood culture - transthoracic echocardiogram negative for endocarditis -Agree that the patient is too frail to undergo transesophageal echocardiogram and is not a candidate for cardiothoracic surgery -Decrease Solu-Medrol to 40 mg once daily times 2 days, then stop   Hypernatremia, improving -Changed to D5W with potassium -Repeat BMP in a.m.  CVA (residual dysphasia), with dysphagia and dysarthria.  High aspiration risk -  Unable to reach wife  -  Unable to place panda -  Dysphagia 1 diet with honey thick per SLP  recommendations for now pending further conversations  Moderate protein calorie malnutrition - Nutrition recommended supplements  Chronic diastolic CHF, appears euvolemic.  EF 74-94%, grade 1 diastolic dysfunction, mild mitral valve regurgitation, mild pulmonary hypertension -  Daily weights  DiabetesType 2controlled withcomplication -4/96 Hemoglobin A1c=5.8 -11/15 increase resistant SSI   HLD -Lipid panel within ADA/AHA guidelines -Lipitor 20 mg daily  Left hip decubitus ulcer stage III -11/15 consulted wound care placed. Await recommendations -Request for air mattress place  Hypokalemia -  Add potassium to IVF  DVT prophylaxis:  lovenox Code Status:  DNR Family Communication:  Attempted to call wife Disposition Plan:  Transfer to telemetry   Consultants:   ID  Procedures:  Transthoracic ECHO  Antimicrobials:  Anti-infectives (From admission, onward)   Start     Dose/Rate Route Frequency Ordered Stop   04/02/17 1000  vancomycin (VANCOCIN) 500 mg in sodium chloride 0.9 % 100 mL IVPB     500 mg 100 mL/hr over 60 Minutes Intravenous Every 48 hours 03/31/17 0855     03/31/17 1800  ampicillin-sulbactam (UNASYN) 1.5 g in sodium chloride 0.9 % 50 mL IVPB     1.5 g 100 mL/hr over 30 Minutes Intravenous Every 12 hours 03/31/17 1057     03/31/17 1045  Ampicillin-Sulbactam (UNASYN) 3 g in sodium chloride 0.9 % 100 mL IVPB  Status:  Discontinued     3 g 200 mL/hr over 30 Minutes Intravenous  Once 03/31/17 1036 03/31/17 1056   03/31/17 0900  vancomycin (VANCOCIN) IVPB 1000 mg/200 mL premix     1,000 mg 200 mL/hr over 60 Minutes Intravenous  Once 03/31/17 0850 03/31/17 1020  03/31/17 0600  ampicillin-sulbactam (UNASYN) 1.5 g in sodium chloride 0.9 % 50 mL IVPB  Status:  Discontinued     1.5 g 100 mL/hr over 30 Minutes Intravenous Every 12 hours 03/30/17 1905 03/31/17 0856   03/30/17 1630  Ampicillin-Sulbactam (UNASYN) 3 g in sodium chloride 0.9 % 100 mL IVPB     3  g 200 mL/hr over 30 Minutes Intravenous  Once 03/30/17 1622 03/30/17 1703   03/30/17 1530  cefTRIAXone (ROCEPHIN) 2 g in dextrose 5 % 50 mL IVPB  Status:  Discontinued     2 g 100 mL/hr over 30 Minutes Intravenous  Once 03/30/17 1525 03/30/17 1622       Subjective: Feels better but still weak.  Denies chest pains, shortness of breath, nausea.  Per the nurse, he was somewhat delirious yesterday and is mentating much better today.  Also per the nurse, they were unable to place the [facility].  Objective: Vitals:   04/01/17 0800 04/01/17 0917 04/01/17 1000 04/01/17 1200  BP: (!) 124/46   111/62  Pulse: 67 (!) 51    Resp: (!) 23 18 18  (!) 23  Temp: 97.6 F (36.4 C)   98.4 F (36.9 C)  TempSrc: Axillary   Oral  SpO2: 100% 98% 92% 98%  Weight:      Height:        Intake/Output Summary (Last 24 hours) at 04/01/2017 1458 Last data filed at 04/01/2017 1326 Gross per 24 hour  Intake 2520 ml  Output 575 ml  Net 1945 ml   Filed Weights   03/31/17 0349 03/31/17 1450 04/01/17 0324  Weight: 46 kg (101 lb 6.6 oz) 46 kg (101 lb 6.6 oz) 50.4 kg (111 lb 1.8 oz)    Examination:  General exam:  Adult male, thin.  No acute distress.  HEENT:  NCAT, MMM Respiratory system: Clear to auscultation bilaterally Cardiovascular system: Regular rate and rhythm, normal S1/S2. No murmurs, rubs, gallops or clicks.  Warm extremities Gastrointestinal system: Normal active bowel sounds, soft, nondistended, nontender. MSK:  Normal tone and bulk, no lower extremity edema Neuro:  Grossly weak, mild hand contractures but still able to use hands somewhat.  bedbound at baseline and requires assistance with turning.      Data Reviewed: I have personally reviewed following labs and imaging studies  CBC: Recent Labs  Lab 03/30/17 1234 03/30/17 1759 03/31/17 0756  WBC 23.2* 20.6* 16.7*  NEUTROABS 20.3* 18.0* 15.8*  HGB 13.4 11.7* 11.1*  HCT 43.2 39.0 37.5*  MCV 99.5 100.8* 100.8*  PLT 253 204 371    Basic Metabolic Panel: Recent Labs  Lab 03/30/17 1234 03/30/17 1759 03/31/17 0330 04/01/17 0350  NA 160* 158* 158* 155*  K 4.6 4.1 4.0 3.3*  CL 122* 128* >130* 127*  CO2 29 25 24 24   GLUCOSE 224* 200* 304* 186*  BUN 83* 67* 59* 47*  CREATININE 2.62* 2.15* 1.78* 1.45*  CALCIUM 9.9 8.6* 8.9 9.1  MG  --   --  2.3 2.0   GFR: Estimated Creatinine Clearance: 23.7 mL/min (A) (by C-G formula based on SCr of 1.45 mg/dL (H)). Liver Function Tests: Recent Labs  Lab 03/30/17 1234 03/30/17 1759  AST 38 29  ALT 29 22  ALKPHOS 67 56  BILITOT 0.8 0.6  PROT 7.5 6.4*  ALBUMIN 2.9* 2.4*   No results for input(s): LIPASE, AMYLASE in the last 168 hours. No results for input(s): AMMONIA in the last 168 hours. Coagulation Profile: Recent Labs  Lab 03/30/17 1759  INR 1.19   Cardiac Enzymes: No results for input(s): CKTOTAL, CKMB, CKMBINDEX, TROPONINI in the last 168 hours. BNP (last 3 results) No results for input(s): PROBNP in the last 8760 hours. HbA1C: No results for input(s): HGBA1C in the last 72 hours. CBG: Recent Labs  Lab 03/31/17 1937 03/31/17 2313 04/01/17 0314 04/01/17 0730 04/01/17 1200  GLUCAP 195* 190* 196* 158* 145*   Lipid Profile: Recent Labs    03/30/17 1759  CHOL 113  HDL 26*  LDLCALC 60  TRIG 135  CHOLHDL 4.3   Thyroid Function Tests: No results for input(s): TSH, T4TOTAL, FREET4, T3FREE, THYROIDAB in the last 72 hours. Anemia Panel: No results for input(s): VITAMINB12, FOLATE, FERRITIN, TIBC, IRON, RETICCTPCT in the last 72 hours. Urine analysis:    Component Value Date/Time   COLORURINE YELLOW 03/30/2017 1532   APPEARANCEUR CLOUDY (A) 03/30/2017 1532   LABSPEC 1.017 03/30/2017 1532   PHURINE 5.0 03/30/2017 1532   GLUCOSEU NEGATIVE 03/30/2017 1532   HGBUR SMALL (A) 03/30/2017 1532   BILIRUBINUR NEGATIVE 03/30/2017 1532   KETONESUR NEGATIVE 03/30/2017 1532   PROTEINUR NEGATIVE 03/30/2017 1532   UROBILINOGEN 0.2 10/15/2013 1913    NITRITE NEGATIVE 03/30/2017 1532   LEUKOCYTESUR NEGATIVE 03/30/2017 1532   Sepsis Labs: @LABRCNTIP (procalcitonin:4,lacticidven:4)  ) Recent Results (from the past 240 hour(s))  Blood Culture (routine x 2)     Status: Abnormal (Preliminary result)   Collection Time: 03/30/17 12:40 PM  Result Value Ref Range Status   Specimen Description BLOOD LEFT FOREARM  Final   Special Requests   Final    BOTTLES DRAWN AEROBIC AND ANAEROBIC Blood Culture adequate volume   Culture  Setup Time   Final    GRAM POSITIVE COCCI AEROBIC BOTTLE ONLY CRITICAL RESULT CALLED TO, READ BACK BY AND VERIFIED WITH: N GLOGOVAC,PHARMD AT 0846 03/31/17 BY L BENFIELD    Culture (A)  Final    STAPHYLOCOCCUS AUREUS SUSCEPTIBILITIES TO FOLLOW Performed at [facility], [place] [address], [place], [place] [address]    Report Status PENDING  Incomplete  Blood Culture ID Panel (Reflexed)     Status: Abnormal   Collection Time: 03/30/17 12:40 PM  Result Value Ref Range Status   Enterococcus species NOT DETECTED NOT DETECTED Final   Listeria monocytogenes NOT DETECTED NOT DETECTED Final   Staphylococcus species DETECTED (A) NOT DETECTED Final    Comment: CRITICAL RESULT CALLED TO, READ BACK BY AND VERIFIED WITH: N GLOGOVAC,PHARMD AT 9622 03/31/17 BY L BENFIELD    Staphylococcus aureus DETECTED (A) NOT DETECTED Final    Comment: Methicillin (oxacillin)-resistant Staphylococcus aureus (MRSA). MRSA is predictably resistant to beta-lactam antibiotics (except ceftaroline). Preferred therapy is vancomycin unless clinically contraindicated. Patient requires contact precautions if  hospitalized. CRITICAL RESULT CALLED TO, READ BACK BY AND VERIFIED WITH: N GLOGOVAC,PHARMD AT 0846 11/15/18N BY L BENFIELD    Methicillin resistance DETECTED (A) NOT DETECTED Final    Comment: CRITICAL RESULT CALLED TO, READ BACK BY AND VERIFIED WITH: N GLOGOVAC,PHARMD AT 2979 03/31/17 BY L BENFIELD    Streptococcus species NOT DETECTED NOT  DETECTED Final   Streptococcus agalactiae NOT DETECTED NOT DETECTED Final   Streptococcus pneumoniae NOT DETECTED NOT DETECTED Final   Streptococcus pyogenes NOT DETECTED NOT DETECTED Final   Acinetobacter baumannii NOT DETECTED NOT DETECTED Final   Enterobacteriaceae species NOT DETECTED NOT DETECTED Final   Enterobacter cloacae complex NOT DETECTED NOT DETECTED Final   Escherichia coli NOT DETECTED NOT DETECTED Final   Klebsiella oxytoca NOT DETECTED NOT  DETECTED Final   Klebsiella pneumoniae NOT DETECTED NOT DETECTED Final   Proteus species NOT DETECTED NOT DETECTED Final   Serratia marcescens NOT DETECTED NOT DETECTED Final   Haemophilus influenzae NOT DETECTED NOT DETECTED Final   Neisseria meningitidis NOT DETECTED NOT DETECTED Final   Pseudomonas aeruginosa NOT DETECTED NOT DETECTED Final   Candida albicans NOT DETECTED NOT DETECTED Final   Candida glabrata NOT DETECTED NOT DETECTED Final   Candida krusei NOT DETECTED NOT DETECTED Final   Candida parapsilosis NOT DETECTED NOT DETECTED Final   Candida tropicalis NOT DETECTED NOT DETECTED Final    Comment: Performed at [facility], [place] [address] [place], [place] [address]  Blood Culture (routine x 2)     Status: None (Preliminary result)   Collection Time: 03/30/17  1:58 PM  Result Value Ref Range Status   Specimen Description BLOOD RIGHT ANTECUBITAL  Final   Special Requests   Final    BOTTLES DRAWN AEROBIC AND ANAEROBIC Blood Culture adequate volume   Culture   Final    NO GROWTH 2 DAYS Performed at [facility], [place] [address] [place], [place] [address]    Report Status PENDING  Incomplete  Urine culture     Status: None   Collection Time: 03/30/17  3:00 PM  Result Value Ref Range Status   Specimen Description URINE, CATHETERIZED  Final   Special Requests NONE  Final   Culture   Final    NO GROWTH Performed at [facility], [address], [place], [place] [address]    Report Status 03/31/2017  FINAL  Final  MRSA PCR Screening     Status: Abnormal   Collection Time: 03/30/17  8:00 PM  Result Value Ref Range Status   MRSA by PCR POSITIVE (A) NEGATIVE Final    Comment:        The GeneXpert MRSA Assay (FDA approved for NASAL specimens only), is one component of a comprehensive MRSA colonization surveillance program. It is not intended to diagnose MRSA infection nor to guide or monitor treatment for MRSA infections. RESULT CALLED TO, READ BACK BY AND VERIFIED WITH: Donato Schultz [id number] @ 2126 BY J SCOTTON   Culture, blood (Routine X 2) w Reflex to ID Panel     Status: None (Preliminary result)   Collection Time: 03/31/17 11:50 AM  Result Value Ref Range Status   Specimen Description BLOOD RIGHT ANTECUBITAL  Final   Special Requests   Final    BOTTLES DRAWN AEROBIC AND ANAEROBIC Blood Culture adequate volume   Culture   Final    NO GROWTH < 24 HOURS Performed at [facility], [address], [place], [place] [address]    Report Status PENDING  Incomplete  Culture, blood (Routine X 2) w Reflex to ID Panel     Status: None (Preliminary result)   Collection Time: 03/31/17 11:50 AM  Result Value Ref Range Status   Specimen Description BLOOD BLOOD RIGHT FOREARM  Final   Special Requests   Final    BOTTLES DRAWN AEROBIC AND ANAEROBIC Blood Culture results may not be optimal due to an excessive volume of blood received in culture bottles   Culture   Final    NO GROWTH < 24 HOURS Performed at [facility], [place] [address], [place], [place] [address]    Report Status PENDING  Incomplete      Radiology Studies: Dg Chest Port 1 View  Result Date: 04/01/2017  CLINICAL DATA:  Difficulty breathing with concern for aspiration EXAM: PORTABLE CHEST 1 VIEW COMPARISON:  March 30, 2017 FINDINGS: There is patchy bibasilar atelectasis. There is no frank edema or consolidation. Heart is mildly enlarged with pulmonary vascularity within normal limits. No adenopathy.  There is aortic atherosclerosis. There is degenerative change in each shoulder with superior migration of each humeral head. Bones are osteoporotic. IMPRESSION: Patchy bibasilar atelectasis. No edema or consolidation. Stable cardiac prominence. There is aortic atherosclerosis. There are chronic rotator cuff tears bilaterally, characterized by superior migration of each humeral head. Bones are osteoporotic. Aortic Atherosclerosis (ICD10-I70.0). Electronically Signed   By: Lowella Grip III M.D.   On: 04/01/2017 07:19     Scheduled Meds: . atorvastatin  20 mg Per NG tube q1800  . Chlorhexidine Gluconate Cloth  6 each Topical Q0600  . heparin subcutaneous  5,000 Units Subcutaneous Q12H  . insulin aspart  0-20 Units Subcutaneous Q4H  . levalbuterol  1.25 mg Nebulization BID  . methylPREDNISolone (SOLU-MEDROL) injection  60 mg Intravenous Daily  . mupirocin ointment  1 application Nasal BID   Continuous Infusions: . ampicillin-sulbactam (UNASYN) IV Stopped (04/01/17 0501)  . dextrose 5 % with KCl 20 mEq / L 20 mEq (04/01/17 1200)  . [START ON 04/02/2017] vancomycin       LOS: 2 days    Time spent: 30 min    Janece Canterbury, MD [facility] Pager [phone]  If 7PM-7AM, please contact night-coverage [email or web address] Password [email or web address] 04/01/2017, 2:58 PM

## 2017-04-02 DIAGNOSIS — A4101 Sepsis due to Methicillin susceptible Staphylococcus aureus: Secondary | ICD-10-CM

## 2017-04-02 DIAGNOSIS — E860 Dehydration: Secondary | ICD-10-CM

## 2017-04-02 DIAGNOSIS — R6520 Severe sepsis without septic shock: Secondary | ICD-10-CM

## 2017-04-02 DIAGNOSIS — A419 Sepsis, unspecified organism: Secondary | ICD-10-CM

## 2017-04-02 DIAGNOSIS — F0151 Vascular dementia with behavioral disturbance: Secondary | ICD-10-CM

## 2017-04-02 DIAGNOSIS — L89223 Pressure ulcer of left hip, stage 3: Secondary | ICD-10-CM

## 2017-04-02 LAB — BASIC METABOLIC PANEL
Anion gap: 3 — ABNORMAL LOW (ref 5–15)
BUN: 45 mg/dL — AB (ref 6–20)
CHLORIDE: 124 mmol/L — AB (ref 101–111)
CO2: 24 mmol/L (ref 22–32)
CREATININE: 1.18 mg/dL (ref 0.61–1.24)
Calcium: 9.1 mg/dL (ref 8.9–10.3)
GFR calc Af Amer: >60 mL/min (ref >60)
GFR calc non Af Amer: 52 mL/min — ABNORMAL LOW (ref >60)
GLUCOSE: 135 mg/dL — AB (ref 65–99)
POTASSIUM: 3.7 mmol/L (ref 3.5–5.1)
SODIUM: 151 mmol/L — AB (ref 135–145)

## 2017-04-02 LAB — GLUCOSE, CAPILLARY
GLUCOSE-CAPILLARY: 80 mg/dL (ref 65–99)
GLUCOSE-CAPILLARY: 108 mg/dL — AB (ref 65–99)
Glucose-Capillary: 123 mg/dL — ABNORMAL HIGH (ref 65–99)
Glucose-Capillary: 191 mg/dL — ABNORMAL HIGH (ref 65–99)

## 2017-04-02 LAB — CULTURE, BLOOD (ROUTINE X 2): Special Requests: ADEQUATE

## 2017-04-02 MED ORDER — LEVALBUTEROL HCL 1.25 MG/0.5ML IN NEBU: 1.2500 mg | INHALATION_SOLUTION | Freq: Four times a day (QID) | RESPIRATORY_TRACT | Status: DC | PRN

## 2017-04-02 MED ORDER — VANCOMYCIN HCL IN DEXTROSE 750-5 MG/150ML-% IV SOLN
750.0000 mg | INTRAVENOUS | Status: DC
  Administered 2017-04-02: 750 mg via INTRAVENOUS
  Filled 2017-04-02 (×2): qty 150

## 2017-04-02 MED ORDER — CLOPIDOGREL BISULFATE 75 MG PO TABS
75.0000 mg | ORAL_TABLET | Freq: Every day | ORAL | Status: DC
  Administered 2017-04-02 – 2017-04-03 (×2): 75 mg via ORAL
  Filled 2017-04-02 (×2): qty 1

## 2017-04-02 MED ORDER — INSULIN ASPART 100 UNIT/ML ~~LOC~~ SOLN
0.0000 [IU] | Freq: Three times a day (TID) | SUBCUTANEOUS | Status: DC
Start: 2017-04-02 — End: 2017-04-03
  Administered 2017-04-03: 4 [IU] via SUBCUTANEOUS

## 2017-04-02 NOTE — Progress Notes (Signed)
Pharmacy Antibiotic Note  Clinton [name] is a 83 y.o. male admitted on 03/30/2017 with bacteremia.  Rapid blood culture identifying organism as MRSA.  Pharmacy had been consulted for vancomycin dosing on 11/15  Currently on day #4 Unasyn for suspected aspiration pneumonia.  Plan: 1) Due to improving SCr, will change vanc from 500mg  IV q48 to 750mg  IV q48 (new est AUC 407, goal 400-500) 2) Continue current Unasyn dosing of 1.5g IV q12  Height: 5\' 8"  (172.7 cm) Weight: 111 lb 5.3 oz (50.5 kg) IBW/kg (Calculated) : 68.4  Temp (24hrs), Avg:98 F (36.7 C), Min:96.6 F (35.9 C), Max:98.6 F (37 C)  Recent Labs  Lab 03/30/17 1234  03/30/17 1706 03/30/17 1759 03/30/17 2009 03/31/17 0330 03/31/17 0756 03/31/17 1050 04/01/17 0350 04/02/17 0349  WBC 23.2*  --   --  20.6*  --   --  16.7*  --   --   --   CREATININE 2.62*  --   --  2.15*  --  1.78*  --   --  1.45* 1.18  LATICACIDVEN  --    < > 1.73 1.4 2.2*  --  1.9 2.1*  --   --    < > = values in this interval not displayed.    Estimated Creatinine Clearance: 29.1 mL/min (by C-G formula based on SCr of 1.18 mg/dL).    No Known Allergies  Antimicrobials this admission:  11/14 Unasyn >>  11/15 Vancomycin >>  Dose adjustments this admission:   Microbiology results:  11/14 BCx: GPC growing in 1 bottle so far (BCID = MRSA, [facility] alerted of result on 11/15) 11/14 UCx:  11/14 MRSA PCR: +  Thank you for allowing pharmacy to be a part of this patient's care.  Adrian Saran, PharmD, BCPS Pager [phone] 04/02/2017 9:22 AM

## 2017-04-02 NOTE — Progress Notes (Signed)
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  His wife called EMS and when they arrived the patient was lying flat and had a big piece of [place] toast stuck in his throat which had to be removed with forceps.  Per the wife, at baseline the patient is able to nod yes and no to answer questions and he answers questions appropriately.  He was found to have MRSA bacteremia without evidence of endocarditis on a transthoracic echocardiogram.  His likely source was a greater trochanter ulcer.  Infectious disease has been consulted and are assisting with management, but we are all concerned about his overall condition.  The patient is frail and not eating or drinking very much.  I doubt he would survive 6 weeks of IV antibiotics.  Also, due to his poor nutritional state and the fact that he is bedbound, I suspect that even if he were to clear his current infection and that his existing ulcers will not heal and will be a source of possible recurrent infections.  Palliative care met with the patient and his wife and he is DNR.  They have elected no feeding tube.  The wife is discussing with other family members about the possibility of transitioning to residential hospice for end-of-life care and stopping antibiotics.  Assessment & Plan:   Active Problems:   Sepsis ([place])   Decubitus ulcer of left upper thigh, stage III (HCC)   AKI (acute kidney injury) ([place])   Dehydration   MRSA bacteremia   Staphylococcus aureus bacteremia with sepsis [facility])   Vascular dementia with behavior disturbance  Severe sepsis/positive MRSA Bacteremia/Aspiration pneumonia, leukocytosis resolving -EMS  upon arrival removed [place] toast from patient's throat. Continue treatment for aspiration pneumonia. -Blood culture positive for MRSA -  Repeat blood cultures no growth to date - transthoracic echocardiogram negative for endocarditis - Patient is too frail to undergo transesophageal echocardiogram and is not a candidate for cardiothoracic surgery -  Last dose of Solu-Medrol tomorrow, then stop (total of 5 days)  Hypernatremia, improving - Continue D5W with potassium -  Repeat BMP in a.m.  CVA (residual dysphasia), with dysphagia and dysarthria.  High aspiration risk, minimal PO intake -  Wife spoke with palliative care today and discussed GOC -  Continue Dysphagia 1 diet with honey thick per SLP recommendations  Moderate protein calorie malnutrition - Nutrition recommended supplements  Chronic diastolic CHF, appears euvolemic.  EF 16-38%, grade 1 diastolic dysfunction, mild mitral valve regurgitation, mild pulmonary hypertension -  Daily weights  DiabetesType 2controlled withcomplication -4/66 Hemoglobin A1c=5.8 -11/15 increase resistant SSI   HLD -Lipid panel within ADA/AHA guidelines - Continue Lipitor 20 mg daily  Left hip decubitus ulcer stage III -11/15 consulted wound care placed. Appreciate recommendations - air mattress  Hypokalemia -  Continue potassium in IVF  DVT prophylaxis:  lovenox Code Status:  DNR Family Communication:  Spoke with patient alone.  Wife already spoke at length with palliative care today.   Disposition Plan:  Transfer to med-surg bed    Consultants:   ID  Palliative care  Procedures:  Transthoracic ECHO  Antimicrobials:  Anti-infectives (From admission, onward)   Start     Dose/Rate Route Frequency Ordered Stop  04/02/17 1000  vancomycin (VANCOCIN) 500 mg in sodium chloride 0.9 % 100 mL IVPB  Status:  Discontinued     500 mg 100 mL/hr over 60 Minutes Intravenous Every 48 hours 03/31/17 0855 04/02/17 0924   04/02/17  1000  vancomycin (VANCOCIN) IVPB 750 mg/150 ml premix     750 mg 150 mL/hr over 60 Minutes Intravenous Every 48 hours 04/02/17 0924     03/31/17 1800  ampicillin-sulbactam (UNASYN) 1.5 g in sodium chloride 0.9 % 50 mL IVPB     1.5 g 100 mL/hr over 30 Minutes Intravenous Every 12 hours 03/31/17 1057     03/31/17 1045  Ampicillin-Sulbactam (UNASYN) 3 g in sodium chloride 0.9 % 100 mL IVPB  Status:  Discontinued     3 g 200 mL/hr over 30 Minutes Intravenous  Once 03/31/17 1036 03/31/17 1056   03/31/17 0900  vancomycin (VANCOCIN) IVPB 1000 mg/200 mL premix     1,000 mg 200 mL/hr over 60 Minutes Intravenous  Once 03/31/17 0850 03/31/17 1020   03/31/17 0600  ampicillin-sulbactam (UNASYN) 1.5 g in sodium chloride 0.9 % 50 mL IVPB  Status:  Discontinued     1.5 g 100 mL/hr over 30 Minutes Intravenous Every 12 hours 03/30/17 1905 03/31/17 0856   03/30/17 1630  Ampicillin-Sulbactam (UNASYN) 3 g in sodium chloride 0.9 % 100 mL IVPB     3 g 200 mL/hr over 30 Minutes Intravenous  Once 03/30/17 1622 03/30/17 1703   03/30/17 1530  cefTRIAXone (ROCEPHIN) 2 g in dextrose 5 % 50 mL IVPB  Status:  Discontinued     2 g 100 mL/hr over 30 Minutes Intravenous  Once 03/30/17 1525 03/30/17 1622       Subjective:  Denies chest pain, SOB, nausea, discomfort.    Objective: Vitals:   04/02/17 0752 04/02/17 0800 04/02/17 0934 04/02/17 1200  BP:  (!) 115/55  127/66  Pulse:      Resp: (!) 22 (!) 26  16  Temp: 98.1 F (36.7 C)     TempSrc: Rectal     SpO2: 100% 96% 98% 98%  Weight:      Height:        Intake/Output Summary (Last 24 hours) at 04/02/2017 1600 Last data filed at 04/02/2017 1500 Gross per 24 hour  Intake 2025 ml  Output 500 ml  Net 1525 ml   Filed Weights   03/31/17 1450 04/01/17 0324 04/02/17 0400  Weight: 46 kg (101 lb 6.6 oz) 50.4 kg (111 lb 1.8 oz) 50.5 kg (111 lb 5.3 oz)    Examination:  General exam: Thin adult male, slouched over in bed to the right side, unable to sit  straight in bed without assistance.  No acute distress.  HEENT:  NCAT, MMM Respiratory system: Clear to auscultation bilaterally Cardiovascular system: Regular rate and rhythm, normal S1/S2. No murmurs, rubs, gallops or clicks.  Warm extremities Gastrointestinal system: Normal active bowel sounds, soft, nondistended, nontender. MSK:  Normal tone and bulk, no lower extremity edema Neuro: No obvious facial droop.  Able to wiggle the fingers and toes in all 4 extremities, but unable to lift any extremities against gravity.  Contractures of the bilateral hands that are mild.  Needs assistance to move in the bed.   Data Reviewed: I have personally reviewed following labs and imaging studies  CBC: Recent Labs  Lab 03/30/17 1234 03/30/17 1759 03/31/17 0756  WBC 23.2* 20.6* 16.7*  NEUTROABS 20.3* 18.0* 15.8*  HGB 13.4 11.7* 11.1*  HCT 43.2 39.0  37.5*  MCV 99.5 100.8* 100.8*  PLT 253 204 761   Basic Metabolic Panel: Recent Labs  Lab 03/30/17 1234 03/30/17 1759 03/31/17 0330 04/01/17 0350 04/02/17 0349  NA 160* 158* 158* 155* 151*  K 4.6 4.1 4.0 3.3* 3.7  CL 122* 128* >130* 127* 124*  CO2 '29 25 24 24 24  '$ GLUCOSE 224* 200* 304* 186* 135*  BUN 83* 67* 59* 47* 45*  CREATININE 2.62* 2.15* 1.78* 1.45* 1.18  CALCIUM 9.9 8.6* 8.9 9.1 9.1  MG  --   --  2.3 2.0  --    GFR: Estimated Creatinine Clearance: 29.1 mL/min (by C-G formula based on SCr of 1.18 mg/dL). Liver Function Tests: Recent Labs  Lab 03/30/17 1234 03/30/17 1759  AST 38 29  ALT 29 22  ALKPHOS 67 56  BILITOT 0.8 0.6  PROT 7.5 6.4*  ALBUMIN 2.9* 2.4*   No results for input(s): LIPASE, AMYLASE in the last 168 hours. No results for input(s): AMMONIA in the last 168 hours. Coagulation Profile: Recent Labs  Lab 03/30/17 1759  INR 1.19   Cardiac Enzymes: No results for input(s): CKTOTAL, CKMB, CKMBINDEX, TROPONINI in the last 168 hours. BNP (last 3 results) No results for input(s): PROBNP in the last 8760  hours. HbA1C: No results for input(s): HGBA1C in the last 72 hours. CBG: Recent Labs  Lab 04/01/17 1608 04/01/17 2022 04/01/17 2334 04/02/17 0406 04/02/17 0744  GLUCAP 181* 300* 275* 123* 80   Lipid Profile: Recent Labs    03/30/17 1759  CHOL 113  HDL 26*  LDLCALC 60  TRIG 135  CHOLHDL 4.3   Thyroid Function Tests: No results for input(s): TSH, T4TOTAL, FREET4, T3FREE, THYROIDAB in the last 72 hours. Anemia Panel: No results for input(s): VITAMINB12, FOLATE, FERRITIN, TIBC, IRON, RETICCTPCT in the last 72 hours. Urine analysis:    Component Value Date/Time   COLORURINE YELLOW 03/30/2017 1532   APPEARANCEUR CLOUDY (A) 03/30/2017 1532   LABSPEC 1.017 03/30/2017 1532   PHURINE 5.0 03/30/2017 1532   GLUCOSEU NEGATIVE 03/30/2017 1532   HGBUR SMALL (A) 03/30/2017 1532   BILIRUBINUR NEGATIVE 03/30/2017 1532   KETONESUR NEGATIVE 03/30/2017 1532   PROTEINUR NEGATIVE 03/30/2017 1532   UROBILINOGEN 0.2 10/15/2013 1913   NITRITE NEGATIVE 03/30/2017 1532   LEUKOCYTESUR NEGATIVE 03/30/2017 1532   Sepsis Labs: '@LABRCNTIP'$ (procalcitonin:4,lacticidven:4)  ) Recent Results (from the past 240 hour(s))  Blood Culture (routine x 2)     Status: Abnormal   Collection Time: 03/30/17 12:40 PM  Result Value Ref Range Status   Specimen Description BLOOD LEFT FOREARM  Final   Special Requests   Final    BOTTLES DRAWN AEROBIC AND ANAEROBIC Blood Culture adequate volume   Culture  Setup Time   Final    GRAM POSITIVE COCCI AEROBIC BOTTLE ONLY CRITICAL RESULT CALLED TO, READ BACK BY AND VERIFIED WITH: N GLOGOVAC,PHARMD AT 0846 03/31/17 BY L BENFIELD Performed at [facility], [place] [address] [place], [place] [address]    Culture METHICILLIN RESISTANT STAPHYLOCOCCUS AUREUS (A)  Final   Report Status 04/02/2017 FINAL  Final   Organism ID, Bacteria METHICILLIN RESISTANT STAPHYLOCOCCUS AUREUS  Final      Susceptibility   Methicillin resistant staphylococcus aureus - MIC*     CIPROFLOXACIN >=8 RESISTANT Resistant     ERYTHROMYCIN >=8 RESISTANT Resistant     GENTAMICIN <=0.5 SENSITIVE Sensitive     OXACILLIN >=4 RESISTANT Resistant     TETRACYCLINE <=1 SENSITIVE Sensitive     VANCOMYCIN <=0.5  SENSITIVE Sensitive     TRIMETH/SULFA <=10 SENSITIVE Sensitive     CLINDAMYCIN <=0.25 SENSITIVE Sensitive     RIFAMPIN <=0.5 SENSITIVE Sensitive     Inducible Clindamycin NEGATIVE Sensitive     * METHICILLIN RESISTANT STAPHYLOCOCCUS AUREUS  Blood Culture ID Panel (Reflexed)     Status: Abnormal   Collection Time: 03/30/17 12:40 PM  Result Value Ref Range Status   Enterococcus species NOT DETECTED NOT DETECTED Final   Listeria monocytogenes NOT DETECTED NOT DETECTED Final   Staphylococcus species DETECTED (A) NOT DETECTED Final    Comment: CRITICAL RESULT CALLED TO, READ BACK BY AND VERIFIED WITH: N GLOGOVAC,PHARMD AT 3235 03/31/17 BY L BENFIELD    Staphylococcus aureus DETECTED (A) NOT DETECTED Final    Comment: Methicillin (oxacillin)-resistant Staphylococcus aureus (MRSA). MRSA is predictably resistant to beta-lactam antibiotics (except ceftaroline). Preferred therapy is vancomycin unless clinically contraindicated. Patient requires contact precautions if  hospitalized. CRITICAL RESULT CALLED TO, READ BACK BY AND VERIFIED WITH: N GLOGOVAC,PHARMD AT 0846 11/15/18N BY L BENFIELD    Methicillin resistance DETECTED (A) NOT DETECTED Final    Comment: CRITICAL RESULT CALLED TO, READ BACK BY AND VERIFIED WITH: N GLOGOVAC,PHARMD AT 5732 03/31/17 BY L BENFIELD    Streptococcus species NOT DETECTED NOT DETECTED Final   Streptococcus agalactiae NOT DETECTED NOT DETECTED Final   Streptococcus pneumoniae NOT DETECTED NOT DETECTED Final   Streptococcus pyogenes NOT DETECTED NOT DETECTED Final   Acinetobacter baumannii NOT DETECTED NOT DETECTED Final   Enterobacteriaceae species NOT DETECTED NOT DETECTED Final   Enterobacter cloacae complex NOT DETECTED NOT DETECTED Final    Escherichia coli NOT DETECTED NOT DETECTED Final   Klebsiella oxytoca NOT DETECTED NOT DETECTED Final   Klebsiella pneumoniae NOT DETECTED NOT DETECTED Final   Proteus species NOT DETECTED NOT DETECTED Final   Serratia marcescens NOT DETECTED NOT DETECTED Final   Haemophilus influenzae NOT DETECTED NOT DETECTED Final   Neisseria meningitidis NOT DETECTED NOT DETECTED Final   Pseudomonas aeruginosa NOT DETECTED NOT DETECTED Final   Candida albicans NOT DETECTED NOT DETECTED Final   Candida glabrata NOT DETECTED NOT DETECTED Final   Candida krusei NOT DETECTED NOT DETECTED Final   Candida parapsilosis NOT DETECTED NOT DETECTED Final   Candida tropicalis NOT DETECTED NOT DETECTED Final    Comment: Performed at [facility], [place]. [address] [place], [place] [address]  Blood Culture (routine x 2)     Status: None (Preliminary result)   Collection Time: 03/30/17  1:58 PM  Result Value Ref Range Status   Specimen Description BLOOD RIGHT ANTECUBITAL  Final   Special Requests   Final    BOTTLES DRAWN AEROBIC AND ANAEROBIC Blood Culture adequate volume   Culture   Final    NO GROWTH 2 DAYS Performed at [facility], [place] [address], [place], [place] [address]    Report Status PENDING  Incomplete  Urine culture     Status: None   Collection Time: 03/30/17  3:00 PM  Result Value Ref Range Status   Specimen Description URINE, CATHETERIZED  Final   Special Requests NONE  Final   Culture   Final    NO GROWTH Performed at [facility], [address], [place], [place] [address]    Report Status 03/31/2017 FINAL  Final  MRSA PCR Screening     Status: Abnormal   Collection Time: 03/30/17  8:00 PM  Result Value Ref Range Status   MRSA by PCR POSITIVE (  A) NEGATIVE Final    Comment:        The GeneXpert MRSA Assay (FDA approved for NASAL specimens only), is one component of a comprehensive MRSA colonization surveillance program. It is not intended to diagnose MRSA infection  nor to guide or monitor treatment for MRSA infections. RESULT CALLED TO, READ BACK BY AND VERIFIED WITH: Donato Schultz [id number] @ 2126 BY J SCOTTON   Culture, blood (Routine X 2) w Reflex to ID Panel     Status: None (Preliminary result)   Collection Time: 03/31/17 11:50 AM  Result Value Ref Range Status   Specimen Description BLOOD RIGHT ANTECUBITAL  Final   Special Requests   Final    BOTTLES DRAWN AEROBIC AND ANAEROBIC Blood Culture adequate volume   Culture   Final    NO GROWTH < 24 HOURS Performed at [facility], [address], [place], [place] [address]    Report Status PENDING  Incomplete  Culture, blood (Routine X 2) w Reflex to ID Panel     Status: None (Preliminary result)   Collection Time: 03/31/17 11:50 AM  Result Value Ref Range Status   Specimen Description BLOOD BLOOD RIGHT FOREARM  Final   Special Requests   Final    BOTTLES DRAWN AEROBIC AND ANAEROBIC Blood Culture results may not be optimal due to an excessive volume of blood received in culture bottles   Culture   Final    NO GROWTH < 24 HOURS Performed at [facility], [place] [address], [place], [place] [address]    Report Status PENDING  Incomplete      Radiology Studies: Dg Chest Port 1 View  Result Date: 04/01/2017 CLINICAL DATA:  Difficulty breathing with concern for aspiration EXAM: PORTABLE CHEST 1 VIEW COMPARISON:  March 30, 2017 FINDINGS: There is patchy bibasilar atelectasis. There is no frank edema or consolidation. Heart is mildly enlarged with pulmonary vascularity within normal limits. No adenopathy. There is aortic atherosclerosis. There is degenerative change in each shoulder with superior migration of each humeral head. Bones are osteoporotic. IMPRESSION: Patchy bibasilar atelectasis. No edema or consolidation. Stable cardiac prominence. There is aortic atherosclerosis. There are chronic rotator cuff tears bilaterally, characterized by superior migration of each humeral head.  Bones are osteoporotic. Aortic Atherosclerosis (ICD10-I70.0). Electronically Signed   By: Lowella Grip III M.D.   On: 04/01/2017 07:19     Scheduled Meds: . atorvastatin  20 mg Oral q1800  . Chlorhexidine Gluconate Cloth  6 each Topical Q0600  . clopidogrel  75 mg Oral Daily  . heparin subcutaneous  5,000 Units Subcutaneous Q12H  . insulin aspart  0-20 Units Subcutaneous Q4H  . levalbuterol  1.25 mg Nebulization BID  . methylPREDNISolone (SOLU-MEDROL) injection  40 mg Intravenous Daily  . mupirocin ointment  1 application Nasal BID   Continuous Infusions: . ampicillin-sulbactam (UNASYN) IV Stopped (04/01/17 1914)  . dextrose 5 % with KCl 20 mEq / L 20 mEq (04/01/17 2344)  . vancomycin Stopped (04/02/17 1248)     LOS: 3 days    Time spent: 30 min    Janece Canterbury, MD [facility] Pager [phone]  If 7PM-7AM, please contact night-coverage [email or web address] Password [email or web address] 04/02/2017, 4:00 PM

## 2017-04-02 NOTE — Progress Notes (Signed)
Subjective:  Appears more calm and answers some questions   Antibiotics:  Anti-infectives (From admission, onward)   Start     Dose/Rate Route Frequency Ordered Stop   04/02/17 1000  vancomycin (VANCOCIN) 500 mg in sodium chloride 0.9 % 100 mL IVPB  Status:  Discontinued     500 mg 100 mL/hr over 60 Minutes Intravenous Every 48 hours 03/31/17 0855 04/02/17 0924   04/02/17 1000  vancomycin (VANCOCIN) IVPB 750 mg/150 ml premix     750 mg 150 mL/hr over 60 Minutes Intravenous Every 48 hours 04/02/17 0924     03/31/17 1800  ampicillin-sulbactam (UNASYN) 1.5 g in sodium chloride 0.9 % 50 mL IVPB     1.5 g 100 mL/hr over 30 Minutes Intravenous Every 12 hours 03/31/17 1057     03/31/17 1045  Ampicillin-Sulbactam (UNASYN) 3 g in sodium chloride 0.9 % 100 mL IVPB  Status:  Discontinued     3 g 200 mL/hr over 30 Minutes Intravenous  Once 03/31/17 1036 03/31/17 1056   03/31/17 0900  vancomycin (VANCOCIN) IVPB 1000 mg/200 mL premix     1,000 mg 200 mL/hr over 60 Minutes Intravenous  Once 03/31/17 0850 03/31/17 1020   03/31/17 0600  ampicillin-sulbactam (UNASYN) 1.5 g in sodium chloride 0.9 % 50 mL IVPB  Status:  Discontinued     1.5 g 100 mL/hr over 30 Minutes Intravenous Every 12 hours 03/30/17 1905 03/31/17 0856   03/30/17 1630  Ampicillin-Sulbactam (UNASYN) 3 g in sodium chloride 0.9 % 100 mL IVPB     3 g 200 mL/hr over 30 Minutes Intravenous  Once 03/30/17 1622 03/30/17 1703   03/30/17 1530  cefTRIAXone (ROCEPHIN) 2 g in dextrose 5 % 50 mL IVPB  Status:  Discontinued     2 g 100 mL/hr over 30 Minutes Intravenous  Once 03/30/17 1525 03/30/17 1622      Medications: Scheduled Meds: . atorvastatin  20 mg Oral q1800  . Chlorhexidine Gluconate Cloth  6 each Topical Q0600  . heparin subcutaneous  5,000 Units Subcutaneous Q12H  . insulin aspart  0-20 Units Subcutaneous Q4H  . levalbuterol  1.25 mg Nebulization BID  . methylPREDNISolone (SOLU-MEDROL) injection  40 mg  Intravenous Daily  . mupirocin ointment  1 application Nasal BID   Continuous Infusions: . ampicillin-sulbactam (UNASYN) IV Stopped (04/01/17 1914)  . dextrose 5 % with KCl 20 mEq / L 20 mEq (04/01/17 2344)  . vancomycin     PRN Meds:.    Objective: Weight change: 9 lb 14.7 oz (4.5 kg)  Intake/Output Summary (Last 24 hours) at 04/02/2017 0928 Last data filed at 04/02/2017 0846 Gross per 24 hour  Intake 720 ml  Output 725 ml  Net -5 ml   Blood pressure (!) 115/55, pulse (!) 51, temperature 98.1 F (36.7 C), temperature source Rectal, resp. rate (!) 26, height 5\' 8"  (1.727 m), weight 111 lb 5.3 oz (50.5 kg), SpO2 96 %. Temp:  [96.6 F (35.9 C)-98.6 F (37 C)] 98.1 F (36.7 C) (11/17 0752) Resp:  [18-26] 26 (11/17 0800) BP: (103-122)/(55-62) 115/55 (11/17 0800) SpO2:  [92 %-100 %] 96 % (11/17 0800) Weight:  [111 lb 5.3 oz (50.5 kg)] 111 lb 5.3 oz (50.5 kg) (11/17 0400)  Physical Exam: General:confused but arousable and answers some questions HEENT: anicteric sclera, EOMI CVS bradycardia normal r,  no murmur rubs or gallops Chest: clear to auscultation bilaterally, no wheezing, rales or rhonchi Abdomen: soft nontender, nondistended, normal bowel  sounds, Extremities: ulcer not examined today  Neuro: nonfocal  CBC:  CBC Latest Ref Rng & Units 03/31/2017 03/30/2017 03/30/2017  WBC 4.0 - 10.5 K/uL 16.7(H) 20.6(H) 23.2(H)  Hemoglobin 13.0 - 17.0 g/dL 11.1(L) 11.7(L) 13.4  Hematocrit 39.0 - 52.0 % 37.5(L) 39.0 43.2  Platelets 150 - 400 K/uL 176 204 253     BMET Recent Labs    04/01/17 0350 04/02/17 0349  NA 155* 151*  K 3.3* 3.7  CL 127* 124*  CO2 24 24  GLUCOSE 186* 135*  BUN 47* 45*  CREATININE 1.45* 1.18  CALCIUM 9.1 9.1     Liver Panel  Recent Labs    03/30/17 1234 03/30/17 1759  PROT 7.5 6.4*  ALBUMIN 2.9* 2.4*  AST 38 29  ALT 29 22  ALKPHOS 67 56  BILITOT 0.8 0.6       Sedimentation Rate Recent Labs    04/01/17 0350  ESRSEDRATE 74*    C-Reactive Protein Recent Labs    04/01/17 0350  CRP 7.4*    Micro Results: Recent Results (from the past 720 hour(s))  Aerobic Culture (superficial specimen)     Status: None   Collection Time: 03/11/17 11:30 AM  Result Value Ref Range Status   Specimen Description BONE  Final   Special Requests L ISCHIAL TUBEROSITY NO BONE SENT IN SWAB  Final   Gram Stain   Final    RARE WBC PRESENT, PREDOMINANTLY PMN MODERATE GRAM POSITIVE COCCI IN PAIRS IN CLUSTERS Performed at [facility], [place] [address] [place], [place] [address]    Culture   Final    ABUNDANT METHICILLIN RESISTANT STAPHYLOCOCCUS AUREUS   Report Status 03/14/2017 FINAL  Final   Organism ID, Bacteria METHICILLIN RESISTANT STAPHYLOCOCCUS AUREUS  Final      Susceptibility   Methicillin resistant staphylococcus aureus - MIC*    CIPROFLOXACIN >=8 RESISTANT Resistant     ERYTHROMYCIN >=8 RESISTANT Resistant     GENTAMICIN <=0.5 SENSITIVE Sensitive     OXACILLIN >=4 RESISTANT Resistant     TETRACYCLINE <=1 SENSITIVE Sensitive     VANCOMYCIN 1 SENSITIVE Sensitive     TRIMETH/SULFA <=10 SENSITIVE Sensitive     CLINDAMYCIN <=0.25 SENSITIVE Sensitive     RIFAMPIN <=0.5 SENSITIVE Sensitive     Inducible Clindamycin NEGATIVE Sensitive     * ABUNDANT METHICILLIN RESISTANT STAPHYLOCOCCUS AUREUS  Urine culture     Status: None   Collection Time: 03/16/17  4:20 PM  Result Value Ref Range Status   Specimen Description URINE, CLEAN CATCH  Final   Special Requests NONE  Final   Culture NO GROWTH  Final   Report Status 03/17/2017 FINAL  Final  Blood Culture (routine x 2)     Status: Abnormal   Collection Time: 03/30/17 12:40 PM  Result Value Ref Range Status   Specimen Description BLOOD LEFT FOREARM  Final   Special Requests   Final    BOTTLES DRAWN AEROBIC AND ANAEROBIC Blood Culture adequate volume   Culture  Setup Time   Final    GRAM POSITIVE COCCI AEROBIC BOTTLE ONLY CRITICAL RESULT CALLED TO, READ BACK BY AND  VERIFIED WITH: N GLOGOVAC,PHARMD AT 0846 03/31/17 BY L BENFIELD Performed at [facility], [address], [place], [place] [address]    Culture METHICILLIN RESISTANT STAPHYLOCOCCUS AUREUS (A)  Final   Report Status 04/02/2017 FINAL  Final   Organism ID, Bacteria METHICILLIN RESISTANT STAPHYLOCOCCUS AUREUS  Final      Susceptibility  Methicillin resistant staphylococcus aureus - MIC*    CIPROFLOXACIN >=8 RESISTANT Resistant     ERYTHROMYCIN >=8 RESISTANT Resistant     GENTAMICIN <=0.5 SENSITIVE Sensitive     OXACILLIN >=4 RESISTANT Resistant     TETRACYCLINE <=1 SENSITIVE Sensitive     VANCOMYCIN <=0.5 SENSITIVE Sensitive     TRIMETH/SULFA <=10 SENSITIVE Sensitive     CLINDAMYCIN <=0.25 SENSITIVE Sensitive     RIFAMPIN <=0.5 SENSITIVE Sensitive     Inducible Clindamycin NEGATIVE Sensitive     * METHICILLIN RESISTANT STAPHYLOCOCCUS AUREUS  Blood Culture ID Panel (Reflexed)     Status: Abnormal   Collection Time: 03/30/17 12:40 PM  Result Value Ref Range Status   Enterococcus species NOT DETECTED NOT DETECTED Final   Listeria monocytogenes NOT DETECTED NOT DETECTED Final   Staphylococcus species DETECTED (A) NOT DETECTED Final    Comment: CRITICAL RESULT CALLED TO, READ BACK BY AND VERIFIED WITH: N GLOGOVAC,PHARMD AT 6578 03/31/17 BY L BENFIELD    Staphylococcus aureus DETECTED (A) NOT DETECTED Final    Comment: Methicillin (oxacillin)-resistant Staphylococcus aureus (MRSA). MRSA is predictably resistant to beta-lactam antibiotics (except ceftaroline). Preferred therapy is vancomycin unless clinically contraindicated. Patient requires contact precautions if  hospitalized. CRITICAL RESULT CALLED TO, READ BACK BY AND VERIFIED WITH: N GLOGOVAC,PHARMD AT 0846 11/15/18N BY L BENFIELD    Methicillin resistance DETECTED (A) NOT DETECTED Final    Comment: CRITICAL RESULT CALLED TO, READ BACK BY AND VERIFIED WITH: N GLOGOVAC,PHARMD AT 4696 03/31/17 BY L BENFIELD    Streptococcus  species NOT DETECTED NOT DETECTED Final   Streptococcus agalactiae NOT DETECTED NOT DETECTED Final   Streptococcus pneumoniae NOT DETECTED NOT DETECTED Final   Streptococcus pyogenes NOT DETECTED NOT DETECTED Final   Acinetobacter baumannii NOT DETECTED NOT DETECTED Final   Enterobacteriaceae species NOT DETECTED NOT DETECTED Final   Enterobacter cloacae complex NOT DETECTED NOT DETECTED Final   Escherichia coli NOT DETECTED NOT DETECTED Final   Klebsiella oxytoca NOT DETECTED NOT DETECTED Final   Klebsiella pneumoniae NOT DETECTED NOT DETECTED Final   Proteus species NOT DETECTED NOT DETECTED Final   Serratia marcescens NOT DETECTED NOT DETECTED Final   Haemophilus influenzae NOT DETECTED NOT DETECTED Final   Neisseria meningitidis NOT DETECTED NOT DETECTED Final   Pseudomonas aeruginosa NOT DETECTED NOT DETECTED Final   Candida albicans NOT DETECTED NOT DETECTED Final   Candida glabrata NOT DETECTED NOT DETECTED Final   Candida krusei NOT DETECTED NOT DETECTED Final   Candida parapsilosis NOT DETECTED NOT DETECTED Final   Candida tropicalis NOT DETECTED NOT DETECTED Final    Comment: Performed at [facility], [place]. [address] [place], [place] [address]  Blood Culture (routine x 2)     Status: None (Preliminary result)   Collection Time: 03/30/17  1:58 PM  Result Value Ref Range Status   Specimen Description BLOOD RIGHT ANTECUBITAL  Final   Special Requests   Final    BOTTLES DRAWN AEROBIC AND ANAEROBIC Blood Culture adequate volume   Culture   Final    NO GROWTH 2 DAYS Performed at [facility], [place] [address] [place], [place] [address]    Report Status PENDING  Incomplete  Urine culture     Status: None   Collection Time: 03/30/17  3:00 PM  Result Value Ref Range Status   Specimen Description URINE, CATHETERIZED  Final   Special Requests NONE  Final   Culture   Final    NO GROWTH Performed  at [facility], [place] [address] [place], [place] [address]     Report Status 03/31/2017 FINAL  Final  MRSA PCR Screening     Status: Abnormal   Collection Time: 03/30/17  8:00 PM  Result Value Ref Range Status   MRSA by PCR POSITIVE (A) NEGATIVE Final    Comment:        The GeneXpert MRSA Assay (FDA approved for NASAL specimens only), is one component of a comprehensive MRSA colonization surveillance program. It is not intended to diagnose MRSA infection nor to guide or monitor treatment for MRSA infections. RESULT CALLED TO, READ BACK BY AND VERIFIED WITH: Donato Schultz [id number] @ 2126 BY J SCOTTON   Culture, blood (Routine X 2) w Reflex to ID Panel     Status: None (Preliminary result)   Collection Time: 03/31/17 11:50 AM  Result Value Ref Range Status   Specimen Description BLOOD RIGHT ANTECUBITAL  Final   Special Requests   Final    BOTTLES DRAWN AEROBIC AND ANAEROBIC Blood Culture adequate volume   Culture   Final    NO GROWTH < 24 HOURS Performed at [facility], [address] [place], [place] [address]    Report Status PENDING  Incomplete  Culture, blood (Routine X 2) w Reflex to ID Panel     Status: None (Preliminary result)   Collection Time: 03/31/17 11:50 AM  Result Value Ref Range Status   Specimen Description BLOOD BLOOD RIGHT FOREARM  Final   Special Requests   Final    BOTTLES DRAWN AEROBIC AND ANAEROBIC Blood Culture results may not be optimal due to an excessive volume of blood received in culture bottles   Culture   Final    NO GROWTH < 24 HOURS Performed at [facility], [place] [address], [place], [place] [address]    Report Status PENDING  Incomplete    Studies/Results: Dg Chest Port 1 View  Result Date: 04/01/2017 CLINICAL DATA:  Difficulty breathing with concern for aspiration EXAM: PORTABLE CHEST 1 VIEW COMPARISON:  March 30, 2017 FINDINGS: There is patchy bibasilar atelectasis. There is no frank edema or consolidation. Heart is mildly enlarged with pulmonary vascularity within normal  limits. No adenopathy. There is aortic atherosclerosis. There is degenerative change in each shoulder with superior migration of each humeral head. Bones are osteoporotic. IMPRESSION: Patchy bibasilar atelectasis. No edema or consolidation. Stable cardiac prominence. There is aortic atherosclerosis. There are chronic rotator cuff tears bilaterally, characterized by superior migration of each humeral head. Bones are osteoporotic. Aortic Atherosclerosis (ICD10-I70.0). Electronically Signed   By: Lowella Grip III M.D.   On: 04/01/2017 07:19      Assessment/Plan:  INTERVAL HISTORY: improved cognition   Active Problems:   Severe sepsis (HCC)   Decubitus ulcer of left upper thigh, stage III (HCC)   AKI (acute kidney injury) ([facility])   Dehydration   MRSA bacteremia   Staphylococcus aureus bacteremia with sepsis [facility])   Vascular dementia with behavior disturbance    THEDORE PICKEL is a 83 y.o. male with  Severe vascular dementia, ulcer, recent fall and admission with severe sepsis due to MRSA bacteremia.  #1 MRSA SAB WITH SEPSIS  . MORTALITY FOR THIS CONDITION IS VERY HIGH ESP GIVEN HIS CO MORBIDITIES   Strongly recommend palliative care consult to consider his prognosis and treatment options  How is he going to get through 4-6 weeks of IV antibiotics ?     [facility] Staphylococcus aureus bacteremia   Staphylococcus aureus bacteremia (SAB) is associated with a high rate of complications and mortality.  Specific aspects of clinical management are critical to optimizing the outcome of patients with SAB.  Therefore, the [facility] Antimicrobial Management Team [facility]) has initiated an intervention aimed at improving the management of SAB at [facility].  To do so, Infectious Diseases physicians are providing an evidence-based consult for the management of all patients with SAB.     Yes No Comments  Perform follow-up blood cultures (even if the patient is  afebrile) to ensure clearance of bacteremia [x]  []  Repeat blood cultures NGTD  Remove vascular catheter and obtain follow-up blood cultures after the removal of the catheter [x]  []  DO NOT PLACE CENTRAL OR PICC LINE  Perform echocardiography to evaluate for endocarditis (transthoracic ECHO is 40-50% sensitive, TEE is > 90% sensitive) []  []  Please keep in mind, that neither test can definitively EXCLUDE endocarditis, and that should clinical suspicion remain high for endocarditis the patient should then still be treated with an "endocarditis" duration of therapy = 6 weeks  WOULD NOT HAZARD TEE IN THIS PT  Consult electrophysiologist to evaluate implanted cardiac device (pacemaker, ICD) []  []    Ensure source control []  []  Have all abscesses been drained effectively? Have deep seeded infections (septic joints or osteomyelitis) had appropriate surgical debridement?  Likely via skin  Investigate for "metastatic" sites of infection []  []  Does the patient have ANY symptom or physical exam finding that would suggest a deeper infection (back or neck pain that may be suggestive of vertebral osteomyelitis or epidural abscess, muscle pain that could be a symptom of pyomyositis)?  Keep in mind that for deep seeded infections MRI imaging with contrast is preferred rather than other often insensitive tests such as plain x-rays, especially early in a patient's presentation.  Difficult to tell  Change antibiotic therapy to vancomycin []  []  Beta-lactam antibiotics are preferred for MSSA due to higher cure rates.   If on Vancomycin, goal trough should be 15 - 20 mcg/mL  Estimated duration of IV antibiotic therapy:  6 weeks []  []  Consult case management for probably prolonged outpatient IV antibiotic therapy        LOS: 3 days   Alcide Evener 04/02/2017, 9:28 AM

## 2017-04-02 NOTE — Consult Note (Signed)
Consultation Note Date: 04/02/2017   Patient Name: Clinton [name]  DOB: December 31, 1923  MRN: [id number]  Age / Sex: 83 y.o., male  PCP: Wenda Low, MD Referring Physician: Janece Canterbury, MD  Reason for Consultation: Establishing goals of care  HPI/Patient Profile: 83 y.o. 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   Patient has had greater trochanter ulcer, decub ulcer of his L upper thigh, noted to be stage III at the time of admission, for which the patient has been going to wound clinic in [place], [place].  The patient's past medical history is also significant for the fact that he has had gradual progressive decline, generalized weakness, falls, poor appetite for the past several weeks-few months.  The patient this time, is admitted to hospital medicine service with severe sepsis due to MRSA bacteremia, he presented with altered mental status and was noted to have a piece of [place] toast stuck in his throat that had to be removed with forceps.   Patient is being followed by infectious disease and has been placed in stepdown unit at [facility] and is on broad-spectrum antibiotics. Hospital course complicated by lack of significant or meaningful recovery thus far. Patient continues to have lethargy, continues to have a minimal-nil oral intake. Hence, a palliative consultation has been requested for goals of care discussions.  The patient is awake reasonably alert sitting up in bed. He is able to state his name. He speaks very slowly until overtly. He has paucity of words. He is not able to toe me why he is here in the hospital.  Call placed and discussed with wife in detail./Recommendations below. Thank you for the consult.  NEXT OF KIN    SUMMARY OF RECOMMENDATIONS  Agree with DNR DNI.  NO PEG, comfort feeds with assistance please.  Care likely moving towards residential hospice, 2 sons live out of town, wife wishes to discuss with them/inform them first about the patient's ongoing decline and serious infections.  Wife is familiar with hospice philosophy of care, they live in [place], [place], she is familiar with the scope of care that's provided at [facility].    Wife says, " I figured he wasn't getting better, there's a time for everybody, I could not get him to eat for several weeks now, you know he has been getting so weak, he has fallen and home and also while going to his wound care appointments at the wound clinic in [place], [place].     Code Status/Advance Care Planning:  DNR    Symptom Management:    as above   Palliative Prophylaxis:   Bowel Regimen   Psycho-social/Spiritual:   Desire for further Chaplaincy support:yes  Additional Recommendations: Education on Hospice  Prognosis:  Guarded   Discharge Planning: possibly residential hospice      Primary Diagnoses: Present on Admission: **None**   I have reviewed the medical record, interviewed the patient and family, and examined the patient. The following aspects are pertinent.  Past Medical History:  Diagnosis Date  . Dementia   . Diabetes mellitus without complication ([facility])   . Hypertension   . Stroke [facility])    Social History   Socioeconomic History  . Marital status: Married    Spouse name: None  . Number of children: None  . Years of education: None  . Highest education level: None  Social Needs  . Financial resource strain: None  . Food insecurity - worry: None  . Food insecurity - inability: None  . Transportation needs - medical: None  . Transportation needs - non-medical: None  Occupational History    . None  Tobacco Use  . Smoking status: Never Smoker  . Smokeless tobacco: Never Used  Substance and Sexual Activity  . Alcohol use: No    Alcohol/week: 0.0 oz    Comment: 09/03/14 one glass of wine weekly  . Drug use: No  . Sexual activity: None  Other Topics Concern  . None  Social History Narrative   Lives at home with wife, [name].     Family History  Problem Relation Age of Onset  . Cancer Mother    Scheduled Meds: . atorvastatin  20 mg Oral q1800  . Chlorhexidine Gluconate Cloth  6 each Topical Q0600  . heparin subcutaneous  5,000 Units Subcutaneous Q12H  . insulin aspart  0-20 Units Subcutaneous Q4H  . levalbuterol  1.25 mg Nebulization BID  . methylPREDNISolone (SOLU-MEDROL) injection  40 mg Intravenous Daily  . mupirocin ointment  1 application Nasal BID   Continuous Infusions: . ampicillin-sulbactam (UNASYN) IV Stopped (04/01/17 1914)  . dextrose 5 % with KCl 20 mEq / L 20 mEq (04/01/17 2344)  . vancomycin     PRN Meds:. Medications Prior to Admission:  Prior to Admission medications   Medication Sig Start Date End Date Taking? Authorizing Provider  atorvastatin (LIPITOR) 10 MG tablet TAKE 1 TABLET BY MOUTH DAILY. 10/01/14  Yes Rosalin Hawking, MD  clopidogrel (PLAVIX) 75 MG tablet TAKE 1 TABLET BY MOUTH ONCE A DAY 04/01/15  Yes Rosalin Hawking, MD  metFORMIN (GLUCOPHAGE) 500 MG tablet Take 500 mg by mouth 2 (two) times daily. 12/29/16  Yes [provider]  Multiple Vitamin (MULTIVITAMIN WITH MINERALS) TABS tablet Take 1 tablet by mouth daily.   Yes [provider]  Nutritional Supplements (FEEDING SUPPLEMENT, GLUCERNA 1.2 CAL,) LIQD Place 237 mLs into feeding tube daily.   Yes [provider]  Nutritional Supplements (NUTRITIONAL SUPPLEMENT PO) Take by mouth. House 2.0 - Med Pass 120 cc by mouth 2 times a day   Yes [provider]  vitamin C (ASCORBIC ACID) 500 MG tablet Take 500 mg by mouth daily.   Yes [provider]  zinc  gluconate 50 MG tablet Take 50 mg by mouth daily.   Yes [provider]  aspirin EC 81 MG tablet Take 81 mg by mouth daily.    [provider]  cephALEXin (KEFLEX) 500 MG capsule Take 1 capsule (500 mg total) by mouth 3 (three) times daily. Patient not taking: Reported on 03/28/2017 03/16/17   Pattricia Boss, MD  famotidine (PEPCID) 20 MG tablet Take 1 tablet (20 mg total) by mouth daily. Patient not taking: Reported on 03/28/2017 07/16/16   Ghimire,  Henreitta Leber, MD   No Known Allergies Review of Systems +weakness  Physical Exam Confused, hard of hearing Is able to answer very few questions appropriately Talks very slowly, has paucity of words S1 S2 Abdomen soft Has ulcer on extremity, ID following Shallow clear breath sounds Appears weak, thin, with muscle wasting evident.   Vital Signs: BP (!) 115/55   Pulse (!) 51   Temp 98.1 F (36.7 C) (Rectal)   Resp (!) 26   Ht 5\' 8"  (1.727 m)   Wt 50.5 kg (111 lb 5.3 oz)   SpO2 98%   BMI 16.93 kg/m  Pain Assessment: PAINAD   Pain Score: 0-No pain   SpO2: SpO2: 98 % O2 Device:SpO2: 98 % O2 Flow Rate: .O2 Flow Rate (L/min): 1 L/min  IO: Intake/output summary:   Intake/Output Summary (Last 24 hours) at 04/02/2017 0958 Last data filed at 04/02/2017 0846 Gross per 24 hour  Intake 720 ml  Output 725 ml  Net -5 ml   PPS 30% LBM: Last BM Date: 04/02/17 Baseline Weight: Weight: 47.6 kg (105 lb) Most recent weight: Weight: 50.5 kg (111 lb 5.3 oz)     Palliative Assessment/Data:   Flowsheet Rows     Most Recent Value  Intake Tab  Referral Department  Hospitalist  Unit at Time of Referral  Intermediate Care Unit  Palliative Care Primary Diagnosis  Sepsis/Infectious Disease  Palliative Care Type  New Palliative care  Reason for referral  Clarify Goals of Care  Date first seen by Palliative Care  04/02/17  Clinical Assessment  Palliative Performance Scale Score  30%  Pain Max last 24 hours  4  Pain Min Last  24 hours  3  Dyspnea Max Last 24 Hours  3  Dyspnea Min Last 24 hours  2  Nausea Max Last 24 Hours  2  Nausea Min Last 24 Hours  1  Anxiety Max Last 24 Hours  3  Anxiety Min Last 24 Hours  2  Psychosocial & Spiritual Assessment  Palliative Care Outcomes  Patient/Family meeting held?  Yes  Who was at the meeting?  patient's wife over the phone.       Time In:  8.30 Time Out:  9.40  Time Total:  70 min  Greater than 50%  of this time was spent counseling and coordinating care related to the above assessment and plan.  Signed by: Loistine Chance, MD  [phone]  Please contact Palliative Medicine Team phone at [phone] for questions and concerns.  For individual provider: See Shea Evans

## 2017-04-03 LAB — GLUCOSE, CAPILLARY
GLUCOSE-CAPILLARY: 99 mg/dL (ref 65–99)
Glucose-Capillary: 160 mg/dL — ABNORMAL HIGH (ref 65–99)
Glucose-Capillary: 148 mg/dL — ABNORMAL HIGH (ref 65–99)

## 2017-04-03 LAB — CBC
HEMATOCRIT: 33.2 % — AB (ref 39.0–52.0)
Hemoglobin: 10.2 g/dL — ABNORMAL LOW (ref 13.0–17.0)
MCH: 29.7 pg (ref 26.0–34.0)
MCHC: 30.7 g/dL (ref 30.0–36.0)
MCV: 96.5 fL (ref 78.0–100.0)
PLATELETS: 152 10*3/uL (ref 150–400)
RBC: 3.44 MIL/uL — ABNORMAL LOW (ref 4.22–5.81)
RDW: 13.7 % (ref 11.5–15.5)
WBC: 15.1 10*3/uL — ABNORMAL HIGH (ref 4.0–10.5)

## 2017-04-03 LAB — BASIC METABOLIC PANEL
Anion gap: 7 (ref 5–15)
BUN: 37 mg/dL — AB (ref 6–20)
CO2: 24 mmol/L (ref 22–32)
Calcium: 9.2 mg/dL (ref 8.9–10.3)
Chloride: 115 mmol/L — ABNORMAL HIGH (ref 101–111)
Creatinine, Ser: 1.09 mg/dL (ref 0.61–1.24)
GFR calc Af Amer: >60 mL/min (ref >60)
GFR, EST NON AFRICAN AMERICAN: 57 mL/min — AB (ref >60)
GLUCOSE: 145 mg/dL — AB (ref 65–99)
POTASSIUM: 3.8 mmol/L (ref 3.5–5.1)
SODIUM: 146 mmol/L — AB (ref 135–145)

## 2017-04-03 MED ORDER — POTASSIUM CL IN DEXTROSE 5% 20 MEQ/L IV SOLN
20.0000 meq | INTRAVENOUS | Status: DC
  Administered 2017-04-04: 20 meq via INTRAVENOUS
  Filled 2017-04-03: qty 1000

## 2017-04-03 MED ORDER — ACETAMINOPHEN 325 MG PO TABS: 650.0000 mg | ORAL_TABLET | Freq: Four times a day (QID) | ORAL | Status: DC | PRN

## 2017-04-03 MED ORDER — LORAZEPAM 2 MG/ML IJ SOLN: 0.5000 mg | INTRAMUSCULAR | Status: DC | PRN

## 2017-04-03 NOTE — Progress Notes (Signed)
PMT progress note  Patient is awake, sitting up in bed, he is weak, frail, has a coarse congested cough upon taking a spoonful of thickened liquid. Wife at bedside.   BP 110/73 (BP Location: Right Arm)   Pulse 79   Temp 98.9 F (37.2 C) (Oral)   Resp 18   Ht 5\' 8"  (1.727 m)   Wt 52.4 kg (115 lb 8.3 oz)   SpO2 96%   BMI 17.56 kg/m  Labs and imaging noted Chart reviewed Touched base with Dr Sheran Fava [facility] MD.   Awake alert Has coarse scattered breath sounds S1 S2 Thin extremities, muscle wasting Abdomen soft No edema  A/P: 83 yo gentleman admitted with severe sepsis, +MRSA bacteremia History of stroke with residual chronic deficits Ongoing gradual progressive functional and cognitive decline.   PLAN: Comfort measures  CSW consult Residential hospice Wife is aware of limited prognosis likely 2 weeks, discussed frankly with wife.  One son visited yesterday, another son to come in today.   25 minutes spent Loistine Chance MD  [place] team [phone]

## 2017-04-03 NOTE — Progress Notes (Signed)
CSW received consult for residential hospice placement- informed that pt wife prefers [facility]  Referral made to [facility]- no beds for today but [facility] hospital liaison to review case and discuss with family in case bed becomes available  CSW will continue to follow  Jorge Ny MSW, LCSW [facility] #: [phone]

## 2017-04-03 NOTE — Progress Notes (Signed)
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  His wife called EMS and when they arrived the patient was lying flat and had a big piece of [place] toast stuck in his throat which had to be removed with forceps.  Per the wife, at baseline the patient is able to nod yes and no to answer questions and he answers questions appropriately.  He was found to have MRSA bacteremia without evidence of endocarditis on a transthoracic echocardiogram.  His likely source was a greater trochanter ulcer.  Infectious disease has been consulted and are assisting with management, but we are all concerned about his overall condition.  The patient is frail and not eating or drinking very much.  I doubt he would survive 6 weeks of IV antibiotics.  Also, due to his poor nutritional state and the fact that he is bedbound, I suspect that even if he were to clear his current infection and that his existing ulcers will not heal and will be a source of possible recurrent infections.  Patient's family elected comfort measures and residential hospice today after meeting with palliative care.    Assessment & Plan:   Active Problems:   Sepsis ([place])   Decubitus ulcer of left upper thigh, stage III (HCC)   AKI (acute kidney injury) ([place])   Dehydration   MRSA bacteremia   Staphylococcus aureus bacteremia with sepsis ([place])   Vascular dementia with behavior disturbance  Severe sepsis/positive MRSA Bacteremia/Aspiration pneumonia, leukocytosis resolving -Blood culture positive for MRSA -  Repeat blood cultures no growth to date - transthoracic echocardiogram negative for endocarditis - Patient is too  frail to undergo transesophageal echocardiogram and is not a candidate for cardiothoracic surgery - d/c vancomycin and unasyn.  Focusing on comfort  Hypernatremia, improving - decrease dextrose fluids -  Discontinue labs  CVA (residual dysphasia), with dysphagia and dysarthria.  High aspiration risk, minimal PO intake -  Change to regular diet with thin liquids  Moderate protein calorie malnutrition - Nutrition recommended supplements  Chronic diastolic CHF, appears euvolemic.  EF 80-99%, grade 1 diastolic dysfunction, mild mitral valve regurgitation, mild pulmonary hypertension -  Daily weights  DiabetesType 2controlled withcomplication -8/33 Hemoglobin A1c=5.8 -  D/c CBG and insulin  HLD -Lipid panel within ADA/AHA guidelines - d/c Lipitor 20 mg daily  Left hip decubitus ulcer stage III -11/15 consulted wound care placed. Appreciate recommendations - air mattress  Hypokalemia, no further blood draws, decrease IVF  DVT prophylaxis:  Palliative/comfort measures only Code Status:  DNR Family Communication:  Patient and wife Disposition Plan:  Residential hospice.  Appreciate SW assistance   Consultants:   ID  Palliative care  Procedures:  Transthoracic ECHO  Antimicrobials:  Anti-infectives (From admission, onward)   Start     Dose/Rate Route Frequency Ordered Stop   04/02/17 1000  vancomycin (VANCOCIN) 500 mg in sodium chloride 0.9 % 100 mL IVPB  Status:  Discontinued     500 mg 100 mL/hr over 60 Minutes Intravenous Every 48 hours 03/31/17 0855 04/02/17 0924   04/02/17 1000  vancomycin (VANCOCIN) IVPB 750 mg/150 ml premix     750 mg 150 mL/hr over 60 Minutes Intravenous Every 48 hours 04/02/17 0924  03/31/17 1800  ampicillin-sulbactam (UNASYN) 1.5 g in sodium chloride 0.9 % 50 mL IVPB     1.5 g 100 mL/hr over 30 Minutes Intravenous Every 12 hours 03/31/17 1057     03/31/17 1045  Ampicillin-Sulbactam (UNASYN) 3 g in sodium chloride 0.9 % 100 mL  IVPB  Status:  Discontinued     3 g 200 mL/hr over 30 Minutes Intravenous  Once 03/31/17 1036 03/31/17 1056   03/31/17 0900  vancomycin (VANCOCIN) IVPB 1000 mg/200 mL premix     1,000 mg 200 mL/hr over 60 Minutes Intravenous  Once 03/31/17 0850 03/31/17 1020   03/31/17 0600  ampicillin-sulbactam (UNASYN) 1.5 g in sodium chloride 0.9 % 50 mL IVPB  Status:  Discontinued     1.5 g 100 mL/hr over 30 Minutes Intravenous Every 12 hours 03/30/17 1905 03/31/17 0856   03/30/17 1630  Ampicillin-Sulbactam (UNASYN) 3 g in sodium chloride 0.9 % 100 mL IVPB     3 g 200 mL/hr over 30 Minutes Intravenous  Once 03/30/17 1622 03/30/17 1703   03/30/17 1530  cefTRIAXone (ROCEPHIN) 2 g in dextrose 5 % 50 mL IVPB  Status:  Discontinued     2 g 100 mL/hr over 30 Minutes Intravenous  Once 03/30/17 1525 03/30/17 1622       Subjective:  Patient denies CP, SOB, nausea.  Not eating although he states he is hungry.    Objective: Vitals:   04/02/17 1200 04/02/17 2110 04/03/17 0532 04/03/17 1217  BP: 127/66 (!) 125/53 123/64 110/73  Pulse:  (!) 52 (!) 51 79  Resp: 16 16 16 18   Temp:  97.6 F (36.4 C) 97.6 F (36.4 C) 98.9 F (37.2 C)  TempSrc:  Oral Oral Oral  SpO2: 98% 96% 94% 96%  Weight:   52.4 kg (115 lb 8.3 oz)   Height:        Intake/Output Summary (Last 24 hours) at 04/03/2017 1554 Last data filed at 04/03/2017 1400 Gross per 24 hour  Intake 1555 ml  Output 1100 ml  Net 455 ml   Filed Weights   04/01/17 0324 04/02/17 0400 04/03/17 0532  Weight: 50.4 kg (111 lb 1.8 oz) 50.5 kg (111 lb 5.3 oz) 52.4 kg (115 lb 8.3 oz)    Examination:  General exam:  Adult male, thin, sitting up in bed, much more difficult to understand today.  No acute distress.  HEENT:  NCAT, MMM Respiratory system: Clear to auscultation bilaterally Cardiovascular system: Regular rate and rhythm, normal S1/S2. No murmurs, rubs, gallops or clicks.  Warm extremities Gastrointestinal system: Normal active bowel sounds,  soft, nondistended, nontender. MSK:  Normal tone and bulk, no lower extremity edema Neuro:  Grossly moves all extremities but diffusely weak and has mild contractures of hands  Data Reviewed: I have personally reviewed following labs and imaging studies  CBC: Recent Labs  Lab 03/30/17 1234 03/30/17 1759 03/31/17 0756 04/03/17 0403  WBC 23.2* 20.6* 16.7* 15.1*  NEUTROABS 20.3* 18.0* 15.8*  --   HGB 13.4 11.7* 11.1* 10.2*  HCT 43.2 39.0 37.5* 33.2*  MCV 99.5 100.8* 100.8* 96.5  PLT 253 204 176 528   Basic Metabolic Panel: Recent Labs  Lab 03/30/17 1759 03/31/17 0330 04/01/17 0350 04/02/17 0349 04/03/17 0403  NA 158* 158* 155* 151* 146*  K 4.1 4.0 3.3* 3.7 3.8  CL 128* >130* 127* 124* 115*  CO2 25 24 24 24 24   GLUCOSE 200* 304* 186* 135* 145*  BUN 67* 59* 47* 45* 37*  CREATININE 2.15*  1.78* 1.45* 1.18 1.09  CALCIUM 8.6* 8.9 9.1 9.1 9.2  MG  --  2.3 2.0  --   --    GFR: Estimated Creatinine Clearance: 32.7 mL/min (by C-G formula based on SCr of 1.09 mg/dL). Liver Function Tests: Recent Labs  Lab 03/30/17 1234 03/30/17 1759  AST 38 29  ALT 29 22  ALKPHOS 67 56  BILITOT 0.8 0.6  PROT 7.5 6.4*  ALBUMIN 2.9* 2.4*   No results for input(s): LIPASE, AMYLASE in the last 168 hours. No results for input(s): AMMONIA in the last 168 hours. Coagulation Profile: Recent Labs  Lab 03/30/17 1759  INR 1.19   Cardiac Enzymes: No results for input(s): CKTOTAL, CKMB, CKMBINDEX, TROPONINI in the last 168 hours. BNP (last 3 results) No results for input(s): PROBNP in the last 8760 hours. HbA1C: No results for input(s): HGBA1C in the last 72 hours. CBG: Recent Labs  Lab 04/02/17 0744 04/02/17 1633 04/02/17 2108 04/03/17 0744 04/03/17 1139  GLUCAP 80 191* 108* 160* 99   Lipid Profile: No results for input(s): CHOL, HDL, LDLCALC, TRIG, CHOLHDL, LDLDIRECT in the last 72 hours. Thyroid Function Tests: No results for input(s): TSH, T4TOTAL, FREET4, T3FREE, THYROIDAB in  the last 72 hours. Anemia Panel: No results for input(s): VITAMINB12, FOLATE, FERRITIN, TIBC, IRON, RETICCTPCT in the last 72 hours. Urine analysis:    Component Value Date/Time   COLORURINE YELLOW 03/30/2017 1532   APPEARANCEUR CLOUDY (A) 03/30/2017 1532   LABSPEC 1.017 03/30/2017 1532   PHURINE 5.0 03/30/2017 1532   GLUCOSEU NEGATIVE 03/30/2017 1532   HGBUR SMALL (A) 03/30/2017 1532   BILIRUBINUR NEGATIVE 03/30/2017 1532   KETONESUR NEGATIVE 03/30/2017 1532   PROTEINUR NEGATIVE 03/30/2017 1532   UROBILINOGEN 0.2 10/15/2013 1913   NITRITE NEGATIVE 03/30/2017 1532   LEUKOCYTESUR NEGATIVE 03/30/2017 1532   Sepsis Labs: @LABRCNTIP (procalcitonin:4,lacticidven:4)  ) Recent Results (from the past 240 hour(s))  Blood Culture (routine x 2)     Status: Abnormal   Collection Time: 03/30/17 12:40 PM  Result Value Ref Range Status   Specimen Description BLOOD LEFT FOREARM  Final   Special Requests   Final    BOTTLES DRAWN AEROBIC AND ANAEROBIC Blood Culture adequate volume   Culture  Setup Time   Final    GRAM POSITIVE COCCI AEROBIC BOTTLE ONLY CRITICAL RESULT CALLED TO, READ BACK BY AND VERIFIED WITH: N GLOGOVAC,PHARMD AT 0846 03/31/17 BY L BENFIELD Performed at [facility], [place] [address], [place], [place] [address]    Culture METHICILLIN RESISTANT STAPHYLOCOCCUS AUREUS (A)  Final   Report Status 04/02/2017 FINAL  Final   Organism ID, Bacteria METHICILLIN RESISTANT STAPHYLOCOCCUS AUREUS  Final      Susceptibility   Methicillin resistant staphylococcus aureus - MIC*    CIPROFLOXACIN >=8 RESISTANT Resistant     ERYTHROMYCIN >=8 RESISTANT Resistant     GENTAMICIN <=0.5 SENSITIVE Sensitive     OXACILLIN >=4 RESISTANT Resistant     TETRACYCLINE <=1 SENSITIVE Sensitive     VANCOMYCIN <=0.5 SENSITIVE Sensitive     TRIMETH/SULFA <=10 SENSITIVE Sensitive     CLINDAMYCIN <=0.25 SENSITIVE Sensitive     RIFAMPIN <=0.5 SENSITIVE Sensitive     Inducible Clindamycin NEGATIVE  Sensitive     * METHICILLIN RESISTANT STAPHYLOCOCCUS AUREUS  Blood Culture ID Panel (Reflexed)     Status: Abnormal   Collection Time: 03/30/17 12:40 PM  Result Value Ref Range Status   Enterococcus species NOT DETECTED NOT DETECTED Final   Listeria monocytogenes NOT DETECTED NOT  DETECTED Final   Staphylococcus species DETECTED (A) NOT DETECTED Final    Comment: CRITICAL RESULT CALLED TO, READ BACK BY AND VERIFIED WITH: N GLOGOVAC,PHARMD AT 6440 03/31/17 BY L BENFIELD    Staphylococcus aureus DETECTED (A) NOT DETECTED Final    Comment: Methicillin (oxacillin)-resistant Staphylococcus aureus (MRSA). MRSA is predictably resistant to beta-lactam antibiotics (except ceftaroline). Preferred therapy is vancomycin unless clinically contraindicated. Patient requires contact precautions if  hospitalized. CRITICAL RESULT CALLED TO, READ BACK BY AND VERIFIED WITH: N GLOGOVAC,PHARMD AT 0846 11/15/18N BY L BENFIELD    Methicillin resistance DETECTED (A) NOT DETECTED Final    Comment: CRITICAL RESULT CALLED TO, READ BACK BY AND VERIFIED WITH: N GLOGOVAC,PHARMD AT 3474 03/31/17 BY L BENFIELD    Streptococcus species NOT DETECTED NOT DETECTED Final   Streptococcus agalactiae NOT DETECTED NOT DETECTED Final   Streptococcus pneumoniae NOT DETECTED NOT DETECTED Final   Streptococcus pyogenes NOT DETECTED NOT DETECTED Final   Acinetobacter baumannii NOT DETECTED NOT DETECTED Final   Enterobacteriaceae species NOT DETECTED NOT DETECTED Final   Enterobacter cloacae complex NOT DETECTED NOT DETECTED Final   Escherichia coli NOT DETECTED NOT DETECTED Final   Klebsiella oxytoca NOT DETECTED NOT DETECTED Final   Klebsiella pneumoniae NOT DETECTED NOT DETECTED Final   Proteus species NOT DETECTED NOT DETECTED Final   Serratia marcescens NOT DETECTED NOT DETECTED Final   Haemophilus influenzae NOT DETECTED NOT DETECTED Final   Neisseria meningitidis NOT DETECTED NOT DETECTED Final   Pseudomonas aeruginosa NOT  DETECTED NOT DETECTED Final   Candida albicans NOT DETECTED NOT DETECTED Final   Candida glabrata NOT DETECTED NOT DETECTED Final   Candida krusei NOT DETECTED NOT DETECTED Final   Candida parapsilosis NOT DETECTED NOT DETECTED Final   Candida tropicalis NOT DETECTED NOT DETECTED Final    Comment: Performed at [facility], [place]. [address], [place], [place] [address]  Blood Culture (routine x 2)     Status: None (Preliminary result)   Collection Time: 03/30/17  1:58 PM  Result Value Ref Range Status   Specimen Description BLOOD RIGHT ANTECUBITAL  Final   Special Requests   Final    BOTTLES DRAWN AEROBIC AND ANAEROBIC Blood Culture adequate volume   Culture   Final    NO GROWTH 4 DAYS Performed at [facility], [place] [address], [place], [place] [address]    Report Status PENDING  Incomplete  Urine culture     Status: None   Collection Time: 03/30/17  3:00 PM  Result Value Ref Range Status   Specimen Description URINE, CATHETERIZED  Final   Special Requests NONE  Final   Culture   Final    NO GROWTH Performed at [facility], [address], [place], [place] [address]    Report Status 03/31/2017 FINAL  Final  MRSA PCR Screening     Status: Abnormal   Collection Time: 03/30/17  8:00 PM  Result Value Ref Range Status   MRSA by PCR POSITIVE (A) NEGATIVE Final    Comment:        The GeneXpert MRSA Assay (FDA approved for NASAL specimens only), is one component of a comprehensive MRSA colonization surveillance program. It is not intended to diagnose MRSA infection nor to guide or monitor treatment for MRSA infections. RESULT CALLED TO, READ BACK BY AND VERIFIED WITH: Donato Schultz [id number] @ 2126 BY J SCOTTON   Culture, blood (Routine X 2) w Reflex to ID Panel  Status: None (Preliminary result)   Collection Time: 03/31/17 11:50 AM  Result Value Ref Range Status   Specimen Description BLOOD RIGHT ANTECUBITAL  Final   Special Requests   Final    BOTTLES  DRAWN AEROBIC AND ANAEROBIC Blood Culture adequate volume   Culture   Final    NO GROWTH 3 DAYS Performed at [facility], [address] [place], [place] [address]    Report Status PENDING  Incomplete  Culture, blood (Routine X 2) w Reflex to ID Panel     Status: None (Preliminary result)   Collection Time: 03/31/17 11:50 AM  Result Value Ref Range Status   Specimen Description BLOOD BLOOD RIGHT FOREARM  Final   Special Requests   Final    BOTTLES DRAWN AEROBIC AND ANAEROBIC Blood Culture results may not be optimal due to an excessive volume of blood received in culture bottles   Culture   Final    NO GROWTH 3 DAYS Performed at [facility], [place] [address], [place], [place] [address]    Report Status PENDING  Incomplete      Radiology Studies: No results found.   Scheduled Meds: . atorvastatin  20 mg Oral q1800  . Chlorhexidine Gluconate Cloth  6 each Topical Q0600  . clopidogrel  75 mg Oral Daily  . heparin subcutaneous  5,000 Units Subcutaneous Q12H  . insulin aspart  0-20 Units Subcutaneous TID AC & HS  . mupirocin ointment  1 application Nasal BID   Continuous Infusions: . ampicillin-sulbactam (UNASYN) IV Stopped (04/03/17 5885)  . dextrose 5 % with KCl 20 mEq / L 20 mEq (04/03/17 1323)  . vancomycin Stopped (04/02/17 1248)     LOS: 4 days    Time spent: 30 min    Janece Canterbury, MD [facility] Pager [phone]  If 7PM-7AM, please contact night-coverage [email or web address] Password [facility] 04/03/2017, 3:54 PM

## 2017-04-03 NOTE — Progress Notes (Signed)
[facility] Liaison: RN visit  Received request from Tammy Sours, Ramirez-Perez for family interest in [facility]. Chart reviewed and spoke with family to acknowledge referral. Unfortunately [facility] is not able to offer a room today. Family and CSW are aware. [facility] liaison will follow up with CSW and family tomorrow or sooner if room becomes available.   Please do not hesitate to call if you have questions.  Thank you,  Farrel Gordon, RN, [facility] Liaison [phone]   All hospital liaisons are on [place].

## 2017-04-04 DIAGNOSIS — J690 Pneumonitis due to inhalation of food and vomit: Secondary | ICD-10-CM

## 2017-04-04 LAB — GLUCOSE, CAPILLARY: Glucose-Capillary: 212 mg/dL — ABNORMAL HIGH (ref 65–99)

## 2017-04-04 LAB — CULTURE, BLOOD (ROUTINE X 2)
CULTURE: NO GROWTH
SPECIAL REQUESTS: ADEQUATE

## 2017-04-04 MED ORDER — SODIUM CHLORIDE 0.9 % IV SOLN
12.5000 mg | Freq: Four times a day (QID) | INTRAVENOUS | Status: DC | PRN
  Filled 2017-04-04: qty 0.5

## 2017-04-04 MED ORDER — CHLORPROMAZINE HCL 10 MG PO TABS: 10.0000 mg | ORAL_TABLET | Freq: Four times a day (QID) | ORAL | Status: AC | PRN

## 2017-04-04 MED ORDER — LORAZEPAM 0.5 MG PO TABS: 0.5000 mg | ORAL_TABLET | ORAL | 0 refills | Status: AC | PRN

## 2017-04-04 NOTE — Progress Notes (Signed)
04/04/17  1400 Called report to [place]. Report given to RN.

## 2017-04-04 NOTE — Care Management Important Message (Addendum)
Important Message  Patient Details IM Letter given to Suzanne/Case Manager to present to Patient Name: Clinton [name] MRN: [id number] Date of Birth: 10-15-1923   Medicare Important Message Given:  Yes    Kerin Salen 04/04/2017, 10:43 AMImportant Message  Patient Details  Name: Clinton [name] MRN: [id number] Date of Birth: Jun 23, 1923   Medicare Important Message Given:  Yes    Kerin Salen 04/04/2017, 10:43 AM

## 2017-04-04 NOTE — Discharge Summary (Signed)
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  His wife called EMS and when they arrived the patient was lying flat and had a big piece of [place] toast stuck in his throat which had to be removed with forceps.  Per the wife, at baseline the patient is able to nod yes and no to answer questions and he answers questions appropriately.  He was found to have MRSA bacteremia without evidence of endocarditis on a transthoracic echocardiogram.  His likely source was a greater trochanter ulcer.  Infectious disease was consulted and assisted with management, but his likelihood of recovery given his frail state was low.  Palliative care was consulted.  The wife and family were not in favor of placing a feeding tube to assist with caloric intake to aid with recovery.  The patient has been declining over the last few weeks and months and they elected comfort measures.  Antibiotics were discontinued and the patient is being transferred to [facility] residential hospice for end-of-life care.    Discharge Diagnoses:  Active Problems:   Sepsis ([place])   Decubitus ulcer of left upper thigh, stage III (HCC)   AKI (acute kidney injury) ([place])   Dehydration   MRSA bacteremia   Staphylococcus aureus bacteremia with sepsis [facility])   Vascular dementia with behavior disturbance  Severe sepsis/positiveMRSA Bacteremia/Aspiration pneumonia, leukocytosis  resolving -  Blood culture positive for MRSA -  Repeat blood cultures no growth to date - transthoracic echocardiogram negative for endocarditis - Patient is too frail to undergo transesophageal echocardiogram and is not a candidate for cardiothoracic surgery - d/c'd vancomycin and unasyn on 11/18.  Focusing on comfort -  Ativan for anxiety and sleep -  Thorazine for hiccoughs  Hypernatremia, improved with IVF -  Discontinue labs and IVF  CVA(residual dysphasia), with dysphagia and dysarthria.  High aspiration risk, minimal PO intake -  Changed to regular diet with thin liquids for comfort  Moderate protein caloriemalnutrition - Nutrition recommended supplements  Chronic diastolic CHF, appears euvolemic.  EF 85-27%, grade 1 diastolic dysfunction, mild mitral valve regurgitation, mild pulmonary hypertension  DiabetesType 2controlled withcomplication -7/82 Hemoglobin A1c=5.8 -  D/c'd CBG and insulin for comfort  HLD - d/c Lipitor 20 mg daily  Left hip decubitus ulcer stage III -Silver Alginate (Aquacel Ag+)  for wound care on left trochanter. Foam protective dressing on sacrum. Prevalon boots for protection of heels - air mattress  Hypokalemia, supplemented in IV fluids, no further blood draws   Discharge Instructions     Medication List    STOP taking these medications   aspirin EC 81 MG tablet   atorvastatin 10 MG tablet Commonly known as:  LIPITOR   cephALEXin 500 MG capsule Commonly known as:  KEFLEX   clopidogrel 75 MG tablet Commonly known as:  PLAVIX   famotidine 20 MG tablet Commonly known as:  PEPCID   feeding supplement (GLUCERNA 1.2 CAL) Liqd   metFORMIN 500 MG tablet Commonly known as:  GLUCOPHAGE   multivitamin with minerals Tabs tablet  NUTRITIONAL SUPPLEMENT PO   vitamin C 500 MG tablet Commonly known as:  ASCORBIC ACID   zinc gluconate 50 MG tablet     TAKE these medications   chlorproMAZINE 10 MG tablet Commonly  known as:  THORAZINE Take 1 tablet (10 mg total) 4 (four) times daily as needed by mouth for hiccoughs.   LORazepam 0.5 MG tablet Commonly known as:  ATIVAN Take 1 tablet (0.5 mg total) every 4 (four) hours as needed by mouth for anxiety or sleep.      Follow-up Information    Wenda Low, MD Follow up.   Specialty:  Internal Medicine Contact information: [address] [place] [address] [phone]          No Known Allergies  Consultations: Infectious disease Wound care Palliative care   Procedures/Studies: Dg Chest 2 View  Result Date: 03/30/2017 CLINICAL DATA:  Cough. EXAM: CHEST  2 VIEW COMPARISON:  Radiographs of March 16, 2017. FINDINGS: The heart size and mediastinal contours are within normal limits. Both lungs are clear. Atherosclerosis of thoracic aorta is noted. Mild bibasilar subsegmental atelectasis is noted The visualized skeletal structures are unremarkable. IMPRESSION: Mild bibasilar subsegmental atelectasis.  Aortic atherosclerosis. Electronically Signed   By: Marijo Conception, M.D.   On: 03/30/2017 14:14   Dg Chest 2 View  Result Date: 03/16/2017 CLINICAL DATA:  Fatigue and diaphoresis EXAM: CHEST  2 VIEW COMPARISON:  July 14, 2016 FINDINGS: There is patchy atelectasis in the lung bases. There is no edema or consolidation. Heart is borderline enlarged with pulmonary vascularity within normal limits. There is aortic atherosclerosis. There is degenerative change in the thoracic spine. There is evidence of old trauma involving the lateral left clavicle with widening of the acromioclavicular joint. There is postoperative change in the left shoulder. There is superior migration of each humeral head. IMPRESSION: Bibasilar atelectasis. No edema or consolidation. Heart prominent with aortic atherosclerosis. Superior migration of each humeral head, a finding indicative of chronic rotator cuff tears bilaterally. Aortic Atherosclerosis  (ICD10-I70.0). Electronically Signed   By: Lowella Grip III M.D.   On: 03/16/2017 15:10   Dg Wrist Complete Right  Result Date: 03/28/2017 CLINICAL DATA:  Wrist pain, fall EXAM: RIGHT WRIST - COMPLETE 3+ VIEW COMPARISON:  None. FINDINGS: No fracture or dislocation. Chondrocalcinosis. Marked arthritis at the first [facility] joint. Degenerative changes at the radiocarpal joint. Widened scaffold lunate interval measuring up to 5 mm. Vascular calcification IMPRESSION: 1. No definite acute osseous abnormality 2. Chondrocalcinosis.  Arthritis at the wrists. 3. Widened scaffold lunate interval consistent with ligamentous abnormality. Electronically Signed   By: Donavan Foil M.D.   On: 03/28/2017 16:53   Ct Head Wo Contrast  Result Date: 03/28/2017 CLINICAL DATA:  Fall EXAM: CT HEAD WITHOUT CONTRAST CT CERVICAL SPINE WITHOUT CONTRAST TECHNIQUE: Multidetector CT imaging of the head and cervical spine was performed following the standard protocol without intravenous contrast. Multiplanar CT image reconstructions of the cervical spine were also generated. COMPARISON:  07/14/2016, 07/13/2016 FINDINGS: CT HEAD FINDINGS Brain: No acute territorial infarction, hemorrhage or intracranial mass is visualized. Moderate severe atrophy. Moderate small vessel ischemic changes of the white matter. Old lacunar infarcts within the thalamus and bilateral basal ganglia. Stable prominent ventricles felt secondary to atrophy Vascular: No hyperdense vessels. Vertebral and carotid calcification Skull: No fracture. Sinuses/Orbits: Mild mucosal thickening in the ethmoid sinuses. No acute orbital abnormality. Mucous retention cysts in the maxillary sinuses Other: None CT CERVICAL SPINE FINDINGS Alignment: No subluxation.  Facet alignment is within normal limits. Skull base and vertebrae: No acute fracture. No primary bone lesion or focal pathologic process. Soft tissues and spinal canal: No prevertebral fluid or swelling. No visible canal  hematoma. Disc levels: Moderate degenerative changes at C3-C4, and C4-C5, moderate severe degenerative changes at C6-C7. Anterior osteophytes at all levels. Multiple level bilateral facet hypertrophy. Multiple level foraminal narrowing, most marked at C3-C4. Upper chest: Lung apices are clear.  Carotid artery calcification. Other: None IMPRESSION: 1. No CT evidence for acute intracranial abnormality. Atrophy and small vessel ischemic changes of the white matter 2. Degenerative changes of the cervical spine. No acute fracture is seen. Electronically Signed   By: Donavan Foil M.D.   On: 03/28/2017 17:23   Ct Cervical Spine Wo Contrast  Result Date: 03/28/2017 CLINICAL DATA:  Fall EXAM: CT HEAD WITHOUT CONTRAST CT CERVICAL SPINE WITHOUT CONTRAST TECHNIQUE: Multidetector CT imaging of the head and cervical spine was performed following the standard protocol without intravenous contrast. Multiplanar CT image reconstructions of the cervical spine were also generated. COMPARISON:  07/14/2016, 07/13/2016 FINDINGS: CT HEAD FINDINGS Brain: No acute territorial infarction, hemorrhage or intracranial mass is visualized. Moderate severe atrophy. Moderate small vessel ischemic changes of the white matter. Old lacunar infarcts within the thalamus and bilateral basal ganglia. Stable prominent ventricles felt secondary to atrophy Vascular: No hyperdense vessels. Vertebral and carotid calcification Skull: No fracture. Sinuses/Orbits: Mild mucosal thickening in the ethmoid sinuses. No acute orbital abnormality. Mucous retention cysts in the maxillary sinuses Other: None CT CERVICAL SPINE FINDINGS Alignment: No subluxation.  Facet alignment is within normal limits. Skull base and vertebrae: No acute fracture. No primary bone lesion or focal pathologic process. Soft tissues and spinal canal: No prevertebral fluid or swelling. No visible canal hematoma. Disc levels: Moderate degenerative changes at C3-C4, and C4-C5, moderate severe  degenerative changes at C6-C7. Anterior osteophytes at all levels. Multiple level bilateral facet hypertrophy. Multiple level foraminal narrowing, most marked at C3-C4. Upper chest: Lung apices are clear.  Carotid artery calcification. Other: None IMPRESSION: 1. No CT evidence for acute intracranial abnormality. Atrophy and small vessel ischemic changes of the white matter 2. Degenerative changes of the cervical spine. No acute fracture is seen. Electronically Signed   By: Donavan Foil M.D.   On: 03/28/2017 17:23   Dg Chest Port 1 View  Result Date: 04/01/2017 CLINICAL DATA:  Difficulty breathing with concern for aspiration EXAM: PORTABLE CHEST 1 VIEW COMPARISON:  March 30, 2017 FINDINGS: There is patchy bibasilar atelectasis. There is no frank edema or consolidation. Heart is mildly enlarged with pulmonary vascularity within normal limits. No adenopathy. There is aortic atherosclerosis. There is degenerative change in each shoulder with superior migration of each humeral head. Bones are osteoporotic. IMPRESSION: Patchy bibasilar atelectasis. No edema or consolidation. Stable cardiac prominence. There is aortic atherosclerosis. There are chronic rotator cuff tears bilaterally, characterized by superior migration of each humeral head. Bones are osteoporotic. Aortic Atherosclerosis (ICD10-I70.0). Electronically Signed   By: Lowella Grip III M.D.   On: 04/01/2017 07:19   Dg Femur Min 2 Views Left  Result Date: 03/16/2017 CLINICAL DATA:  Chills, fatigue.  Left upper leg pain, wound EXAM: LEFT FEMUR 2 VIEWS COMPARISON:  None. FINDINGS: Moderate degenerative changes in the left hip with joint space loss and spurring. No acute bony abnormality. Specifically, no fracture, subluxation, or dislocation. Soft tissues are intact. Knee joint is maintained. No joint effusion within the left knee. IMPRESSION: Mild-to-moderate degenerative changes in the left hip.  No acute bony abnormality. Electronically Signed    By: Rolm Baptise M.D.   On: 03/16/2017 15:10       Subjective: The patient states he has been having problems with hiccups since yesterday.  He denies shortness of breath, chest pain, abdominal pain, nausea.  He has not been eating very much and does not have much appetite this morning.  He has been voiding and making stools.  Discharge Exam: Vitals:   04/03/17 2236 04/04/17 1309  BP: (!) 141/55 (!) 109/51  Pulse: (!) 16 69  Resp: 16 15  Temp: 98.3 F (36.8 C) 97.9 F (36.6 C)  SpO2: 97% 95%   Vitals:   04/03/17 0532 04/03/17 1217 04/03/17 2236 04/04/17 1309  BP: 123/64 110/73 (!) 141/55 (!) 109/51  Pulse: (!) 51 79 (!) 16 69  Resp: 16 18 16 15   Temp: 97.6 F (36.4 C) 98.9 F (37.2 C) 98.3 F (36.8 C) 97.9 F (36.6 C)  TempSrc: Oral Oral Oral Oral  SpO2: 94% 96% 97% 95%  Weight: 52.4 kg (115 lb 8.3 oz)   52 kg (114 lb 10.2 oz)  Height:        General: Cachectic male, propped up in bed, asleep but arousable to voice, not in acute distress Cardiovascular: RRR, S1/S2 +, no rubs, no gallops Respiratory: CTA bilaterally, no wheezing, no rhonchi Abdominal: Soft, NT, ND, bowel sounds + Extremities: no edema, no cyanosis     The results of significant diagnostics from this hospitalization (including imaging, microbiology, ancillary and laboratory) are listed below for reference.     Microbiology: Recent Results (from the past 240 hour(s))  Blood Culture (routine x 2)     Status: Abnormal   Collection Time: 03/30/17 12:40 PM  Result Value Ref Range Status   Specimen Description BLOOD LEFT FOREARM  Final   Special Requests   Final    BOTTLES DRAWN AEROBIC AND ANAEROBIC Blood Culture adequate volume   Culture  Setup Time   Final    GRAM POSITIVE COCCI AEROBIC BOTTLE ONLY CRITICAL RESULT CALLED TO, READ BACK BY AND VERIFIED WITH: N GLOGOVAC,PHARMD AT 0846 03/31/17 BY L BENFIELD Performed at [facility], [place] [address] [place], [place] [address]    Culture  METHICILLIN RESISTANT STAPHYLOCOCCUS AUREUS (A)  Final   Report Status 04/02/2017 FINAL  Final   Organism ID, Bacteria METHICILLIN RESISTANT STAPHYLOCOCCUS AUREUS  Final      Susceptibility   Methicillin resistant staphylococcus aureus - MIC*    CIPROFLOXACIN >=8 RESISTANT Resistant     ERYTHROMYCIN >=8 RESISTANT Resistant     GENTAMICIN <=0.5 SENSITIVE Sensitive     OXACILLIN >=4 RESISTANT Resistant     TETRACYCLINE <=1 SENSITIVE Sensitive     VANCOMYCIN <=0.5 SENSITIVE Sensitive     TRIMETH/SULFA <=10 SENSITIVE Sensitive     CLINDAMYCIN <=0.25 SENSITIVE Sensitive     RIFAMPIN <=0.5 SENSITIVE Sensitive     Inducible Clindamycin NEGATIVE Sensitive     * METHICILLIN RESISTANT STAPHYLOCOCCUS AUREUS  Blood Culture ID Panel (Reflexed)     Status: Abnormal   Collection Time: 03/30/17 12:40 PM  Result Value Ref Range Status   Enterococcus species NOT DETECTED NOT DETECTED Final   Listeria monocytogenes NOT DETECTED NOT DETECTED Final   Staphylococcus species DETECTED (A) NOT DETECTED Final    Comment: CRITICAL RESULT CALLED TO, READ BACK BY AND VERIFIED WITH: N GLOGOVAC,PHARMD AT 6948 03/31/17 BY L BENFIELD    Staphylococcus aureus DETECTED (A) NOT DETECTED Final  Comment: Methicillin (oxacillin)-resistant Staphylococcus aureus (MRSA). MRSA is predictably resistant to beta-lactam antibiotics (except ceftaroline). Preferred therapy is vancomycin unless clinically contraindicated. Patient requires contact precautions if  hospitalized. CRITICAL RESULT CALLED TO, READ BACK BY AND VERIFIED WITH: N GLOGOVAC,PHARMD AT 0846 11/15/18N BY L BENFIELD    Methicillin resistance DETECTED (A) NOT DETECTED Final    Comment: CRITICAL RESULT CALLED TO, READ BACK BY AND VERIFIED WITH: N GLOGOVAC,PHARMD AT 5409 03/31/17 BY L BENFIELD    Streptococcus species NOT DETECTED NOT DETECTED Final   Streptococcus agalactiae NOT DETECTED NOT DETECTED Final   Streptococcus pneumoniae NOT DETECTED NOT DETECTED  Final   Streptococcus pyogenes NOT DETECTED NOT DETECTED Final   Acinetobacter baumannii NOT DETECTED NOT DETECTED Final   Enterobacteriaceae species NOT DETECTED NOT DETECTED Final   Enterobacter cloacae complex NOT DETECTED NOT DETECTED Final   Escherichia coli NOT DETECTED NOT DETECTED Final   Klebsiella oxytoca NOT DETECTED NOT DETECTED Final   Klebsiella pneumoniae NOT DETECTED NOT DETECTED Final   Proteus species NOT DETECTED NOT DETECTED Final   Serratia marcescens NOT DETECTED NOT DETECTED Final   Haemophilus influenzae NOT DETECTED NOT DETECTED Final   Neisseria meningitidis NOT DETECTED NOT DETECTED Final   Pseudomonas aeruginosa NOT DETECTED NOT DETECTED Final   Candida albicans NOT DETECTED NOT DETECTED Final   Candida glabrata NOT DETECTED NOT DETECTED Final   Candida krusei NOT DETECTED NOT DETECTED Final   Candida parapsilosis NOT DETECTED NOT DETECTED Final   Candida tropicalis NOT DETECTED NOT DETECTED Final    Comment: Performed at [facility], [place]. [address] [place], [address] [address]  Blood Culture (routine x 2)     Status: None   Collection Time: 03/30/17  1:58 PM  Result Value Ref Range Status   Specimen Description BLOOD RIGHT ANTECUBITAL  Final   Special Requests   Final    BOTTLES DRAWN AEROBIC AND ANAEROBIC Blood Culture adequate volume   Culture   Final    NO GROWTH 5 DAYS Performed at [facility], [place] [address] [place], [place] [address]    Report Status 04/04/2017 FINAL  Final  Urine culture     Status: None   Collection Time: 03/30/17  3:00 PM  Result Value Ref Range Status   Specimen Description URINE, CATHETERIZED  Final   Special Requests NONE  Final   Culture   Final    NO GROWTH Performed at [facility], [place] [address] [place],  [address]    Report Status 03/31/2017 FINAL  Final  MRSA PCR Screening     Status: Abnormal   Collection Time: 03/30/17  8:00 PM  Result Value Ref Range Status   MRSA by PCR POSITIVE  (A) NEGATIVE Final    Comment:        The GeneXpert MRSA Assay (FDA approved for NASAL specimens only), is one component of a comprehensive MRSA colonization surveillance program. It is not intended to diagnose MRSA infection nor to guide or monitor treatment for MRSA infections. RESULT CALLED TO, READ BACK BY AND VERIFIED WITH: Donato Schultz [id number] @ 2126 BY J SCOTTON   Culture, blood (Routine X 2) w Reflex to ID Panel     Status: None (Preliminary result)   Collection Time: 03/31/17 11:50 AM  Result Value Ref Range Status   Specimen Description BLOOD RIGHT ANTECUBITAL  Final   Special Requests   Final    BOTTLES DRAWN AEROBIC AND ANAEROBIC Blood Culture adequate volume  Culture   Final    NO GROWTH 4 DAYS Performed at [facility], [place] [address], [place], [place] [address]    Report Status PENDING  Incomplete  Culture, blood (Routine X 2) w Reflex to ID Panel     Status: None (Preliminary result)   Collection Time: 03/31/17 11:50 AM  Result Value Ref Range Status   Specimen Description BLOOD BLOOD RIGHT FOREARM  Final   Special Requests   Final    BOTTLES DRAWN AEROBIC AND ANAEROBIC Blood Culture results may not be optimal due to an excessive volume of blood received in culture bottles   Culture   Final    NO GROWTH 4 DAYS Performed at [facility], [place] [address] [place], [place] [address]    Report Status PENDING  Incomplete     Labs: BNP (last 3 results) No results for input(s): BNP in the last 8760 hours. Basic Metabolic Panel: Recent Labs  Lab 03/30/17 1759 03/31/17 0330 04/01/17 0350 04/02/17 0349 04/03/17 0403  NA 158* 158* 155* 151* 146*  K 4.1 4.0 3.3* 3.7 3.8  CL 128* >130* 127* 124* 115*  CO2 25 24 24 24 24   GLUCOSE 200* 304* 186* 135* 145*  BUN 67* 59* 47* 45* 37*  CREATININE 2.15* 1.78* 1.45* 1.18 1.09  CALCIUM 8.6* 8.9 9.1 9.1 9.2  MG  --  2.3 2.0  --   --    Liver Function Tests: Recent Labs  Lab 03/30/17 1234  03/30/17 1759  AST 38 29  ALT 29 22  ALKPHOS 67 56  BILITOT 0.8 0.6  PROT 7.5 6.4*  ALBUMIN 2.9* 2.4*   No results for input(s): LIPASE, AMYLASE in the last 168 hours. No results for input(s): AMMONIA in the last 168 hours. CBC: Recent Labs  Lab 03/30/17 1234 03/30/17 1759 03/31/17 0756 04/03/17 0403  WBC 23.2* 20.6* 16.7* 15.1*  NEUTROABS 20.3* 18.0* 15.8*  --   HGB 13.4 11.7* 11.1* 10.2*  HCT 43.2 39.0 37.5* 33.2*  MCV 99.5 100.8* 100.8* 96.5  PLT 253 204 176 152   Cardiac Enzymes: No results for input(s): CKTOTAL, CKMB, CKMBINDEX, TROPONINI in the last 168 hours. BNP: Invalid input(s): POCBNP CBG: Recent Labs  Lab 04/02/17 1633 04/02/17 2108 04/03/17 0744 04/03/17 1139 04/03/17 1659  GLUCAP 191* 108* 160* 99 148*   D-Dimer No results for input(s): DDIMER in the last 72 hours. Hgb A1c No results for input(s): HGBA1C in the last 72 hours. Lipid Profile No results for input(s): CHOL, HDL, LDLCALC, TRIG, CHOLHDL, LDLDIRECT in the last 72 hours. Thyroid function studies No results for input(s): TSH, T4TOTAL, T3FREE, THYROIDAB in the last 72 hours.  Invalid input(s): FREET3 Anemia work up No results for input(s): VITAMINB12, FOLATE, FERRITIN, TIBC, IRON, RETICCTPCT in the last 72 hours. Urinalysis    Component Value Date/Time   COLORURINE YELLOW 03/30/2017 1532   APPEARANCEUR CLOUDY (A) 03/30/2017 1532   LABSPEC 1.017 03/30/2017 1532   PHURINE 5.0 03/30/2017 1532   GLUCOSEU NEGATIVE 03/30/2017 1532   HGBUR SMALL (A) 03/30/2017 1532   BILIRUBINUR NEGATIVE 03/30/2017 1532   KETONESUR NEGATIVE 03/30/2017 1532   PROTEINUR NEGATIVE 03/30/2017 1532   UROBILINOGEN 0.2 10/15/2013 1913   NITRITE NEGATIVE 03/30/2017 1532   LEUKOCYTESUR NEGATIVE 03/30/2017 1532   Sepsis Labs Invalid input(s): PROCALCITONIN,  WBC,  LACTICIDVEN   Time coordinating discharge: Over 30 minutes  SIGNED:   Janece Canterbury, MD  [facility] 04/04/2017, 1:12 PM Pager    If 7PM-7AM,  please contact night-coverage [email or web address] Password [email or web address]

## 2017-04-04 NOTE — Progress Notes (Signed)
CSW spoke with Erling Conte W/ [facility] There a bed available today.  She plans to meet with family at 11:00am CSW will assist and arrange for transportation once admission paperwork has been completed.  Kathrin Greathouse, Latanya Presser, MSW Clinical Social Worker  [phone] 04/04/2017  11:01 AM

## 2017-04-04 NOTE — Progress Notes (Signed)
SLP Cancellation Note  Patient Details Name: Clinton [name] MRN: [id number] DOB: Jan 30, 1924   Cancelled treatment:       Reason Eval/Treat Not Completed: Other (comment)(pt to dc to hospice )   Luanna Salk, [place] [facility] SLP [phone]

## 2017-04-04 NOTE — Consult Note (Signed)
[facility] room available for [name] today. Met with spouse to complete paper work for transfer today. CSW [place] aware.   RN please call report to [phone].  Please send DC summary to [phone].  Thank you,  Clinton Conte, LCSW [phone]

## 2017-04-04 NOTE — Progress Notes (Signed)
D/C Summary faxed. [facility] called for transport. Nurse informed.   Kathrin Greathouse, Latanya Presser, MSW Clinical Social Worker  [phone] 04/04/2017  1:54 PM

## 2017-04-05 LAB — CULTURE, BLOOD (ROUTINE X 2)
Culture: NO GROWTH
Culture: NO GROWTH
SPECIAL REQUESTS: ADEQUATE

## 2017-04-16 DEATH — deceased

## 2017-05-19 ENCOUNTER — Ambulatory Visit (INDEPENDENT_AMBULATORY_CARE_PROVIDER_SITE_OTHER): Payer: Medicare Other | Admitting: Ophthalmology

## 2017-06-06 ENCOUNTER — Telehealth: Payer: Self-pay | Admitting: Nurse Practitioner

## 2017-06-06 NOTE — Telephone Encounter (Signed)
So sorry to hear that. Please send my condolence to his wife. Thanks.   Rosalin Hawking, MD PhD Stroke Neurology 06/06/2017 5:38 PM

## 2017-06-06 NOTE — Telephone Encounter (Signed)
Clinton [name] pts wife called to inform us that pt has passed away as of 2017/04/25

## 2017-06-07 ENCOUNTER — Ambulatory Visit: Payer: Medicare Other | Admitting: Nurse Practitioner

## 2017-06-08 ENCOUNTER — Ambulatory Visit: Payer: Medicare Other | Admitting: Nurse Practitioner

## 2018-11-13 IMAGING — DX DG FEMUR 2+V*L*
4 series · 4 of 4 positions shown · non-contrast
Comparison: None.

CLINICAL DATA: Chills, fatigue.  Left upper leg pain, wound

EXAM:
LEFT FEMUR 2 VIEWS

[femur ap (1 of 2)]
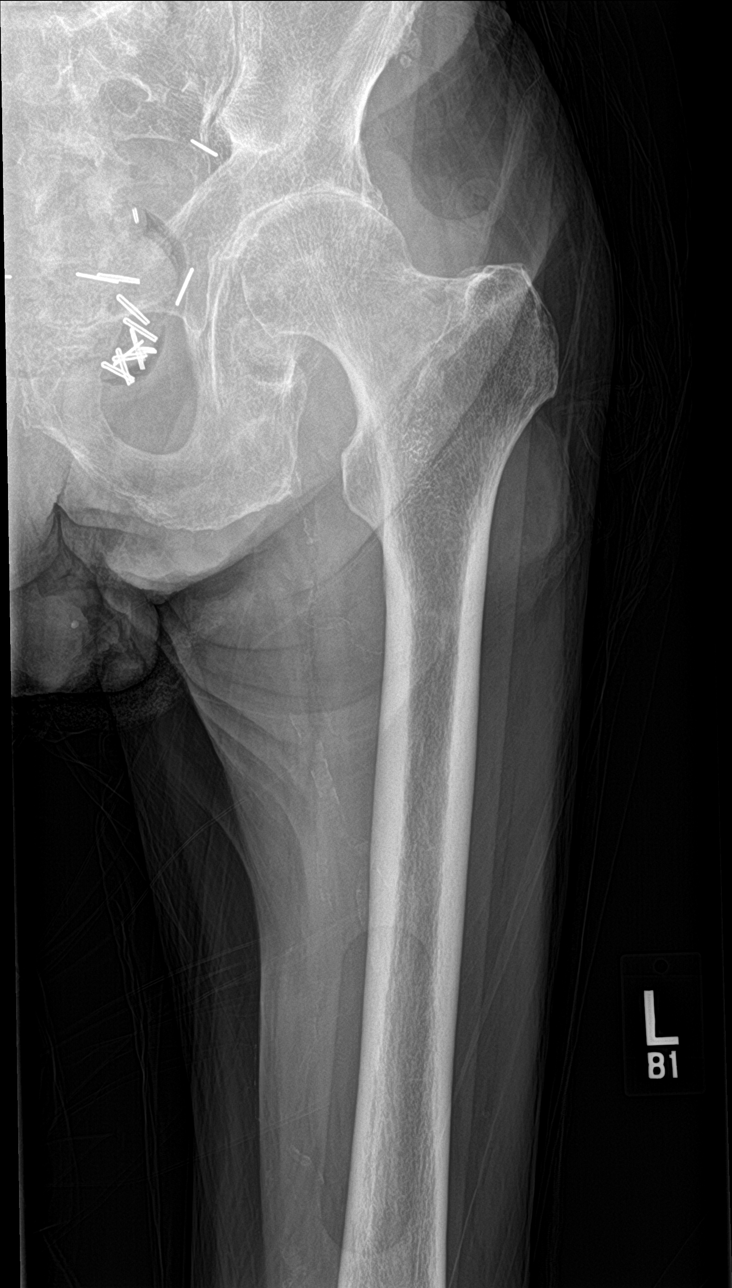

[femur ap (2 of 2)]
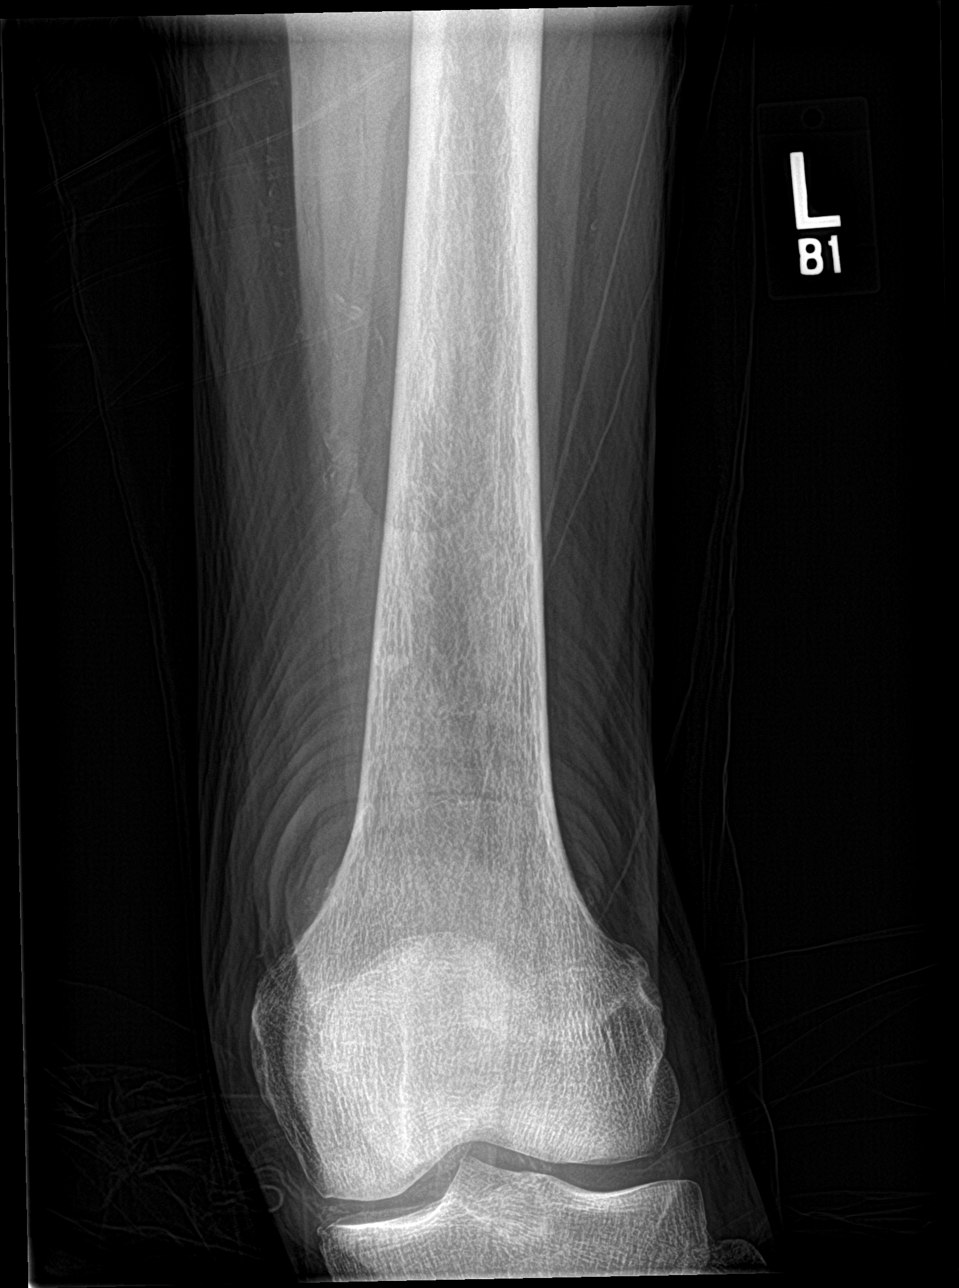

[femur lat (1 of 2)]
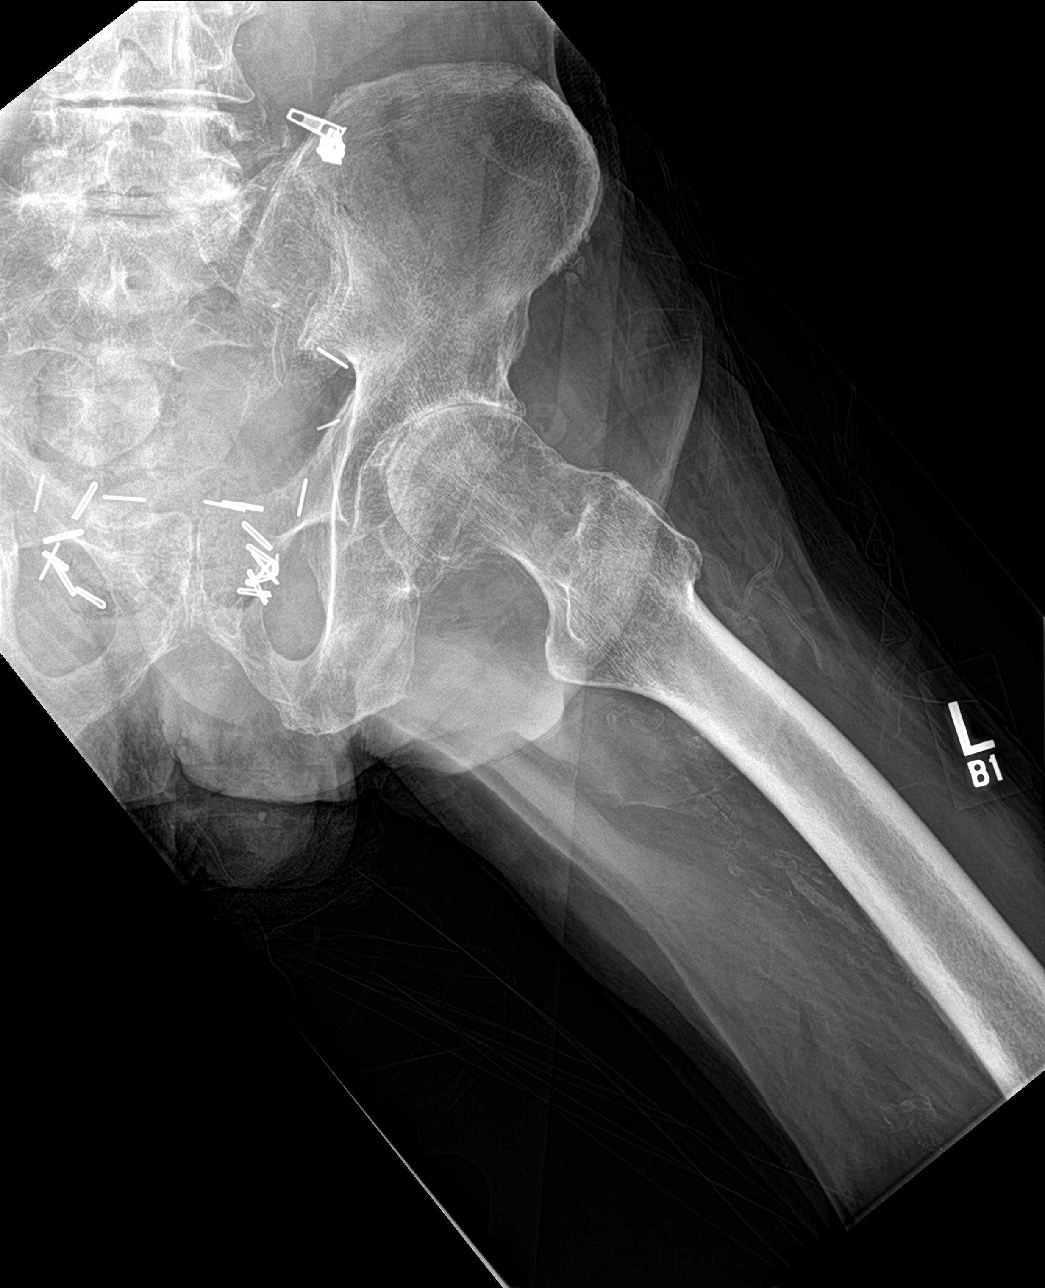

[femur lat (2 of 2)]
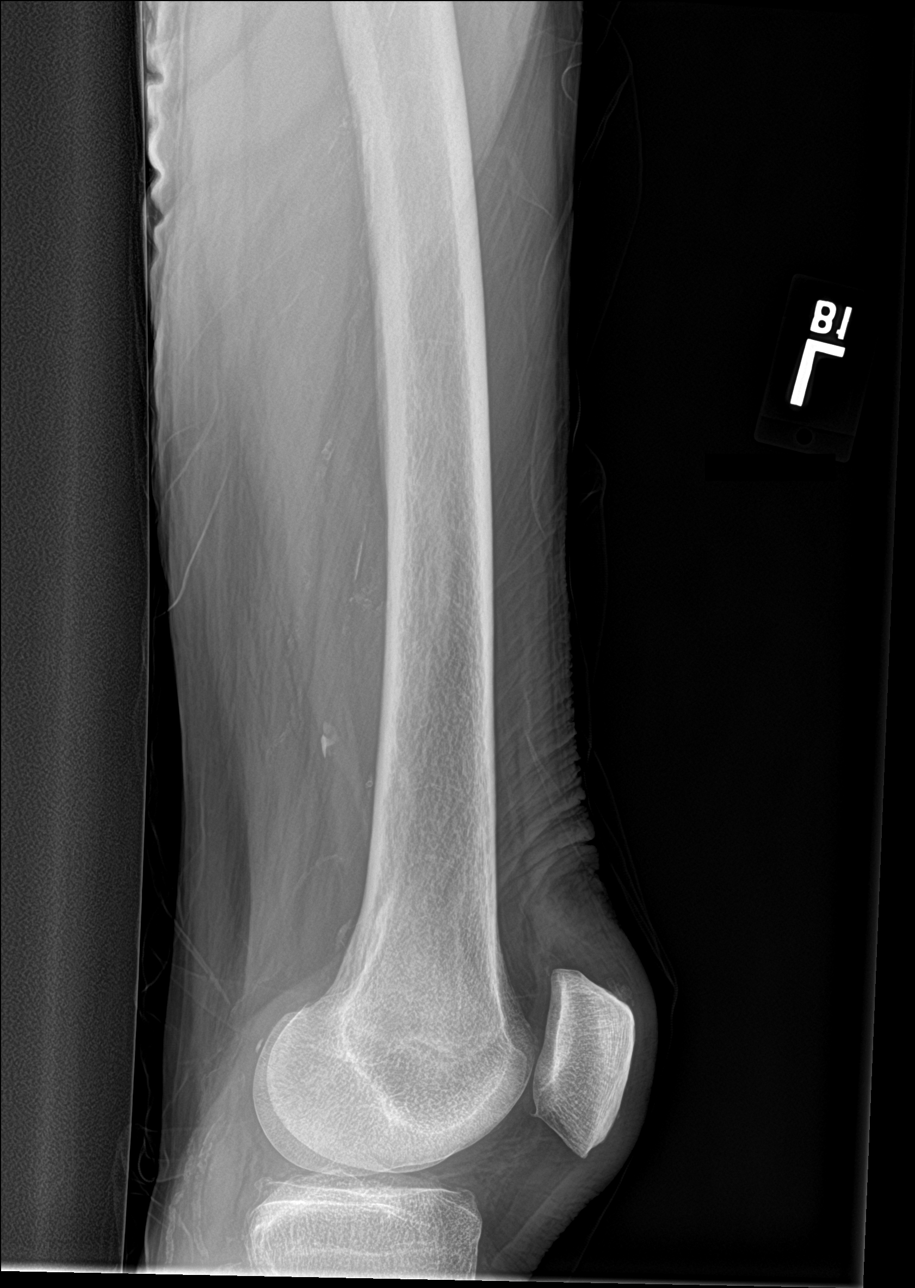

[4 of 4 positions shown; findings below may reference images not displayed]

FINDINGS: Moderate degenerative changes in the left hip with joint space loss
and spurring. No acute bony abnormality. Specifically, no fracture,
subluxation, or dislocation. Soft tissues are intact. Knee joint is
maintained. No joint effusion within the left knee.
IMPRESSION: Mild-to-moderate degenerative changes in the left hip. No acute bony
abnormality.

## 2018-11-25 IMAGING — DX DG WRIST COMPLETE 3+V*R*
4 series · 4 of 4 positions shown · non-contrast
Comparison: None.

CLINICAL DATA: Wrist pain, fall

EXAM:
RIGHT WRIST - COMPLETE 3+ VIEW

[wrist ap (1 of 2)]
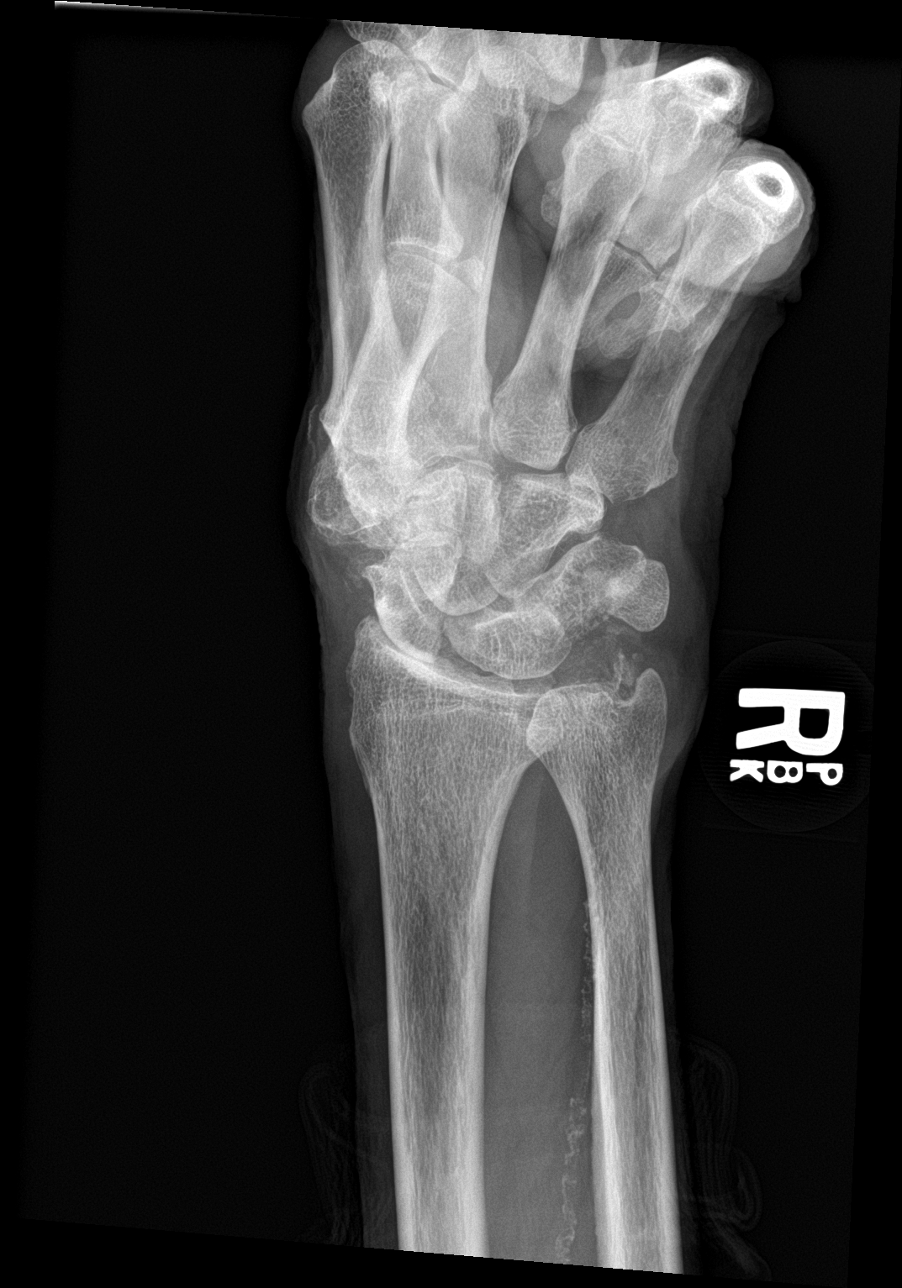

[wrist obl]
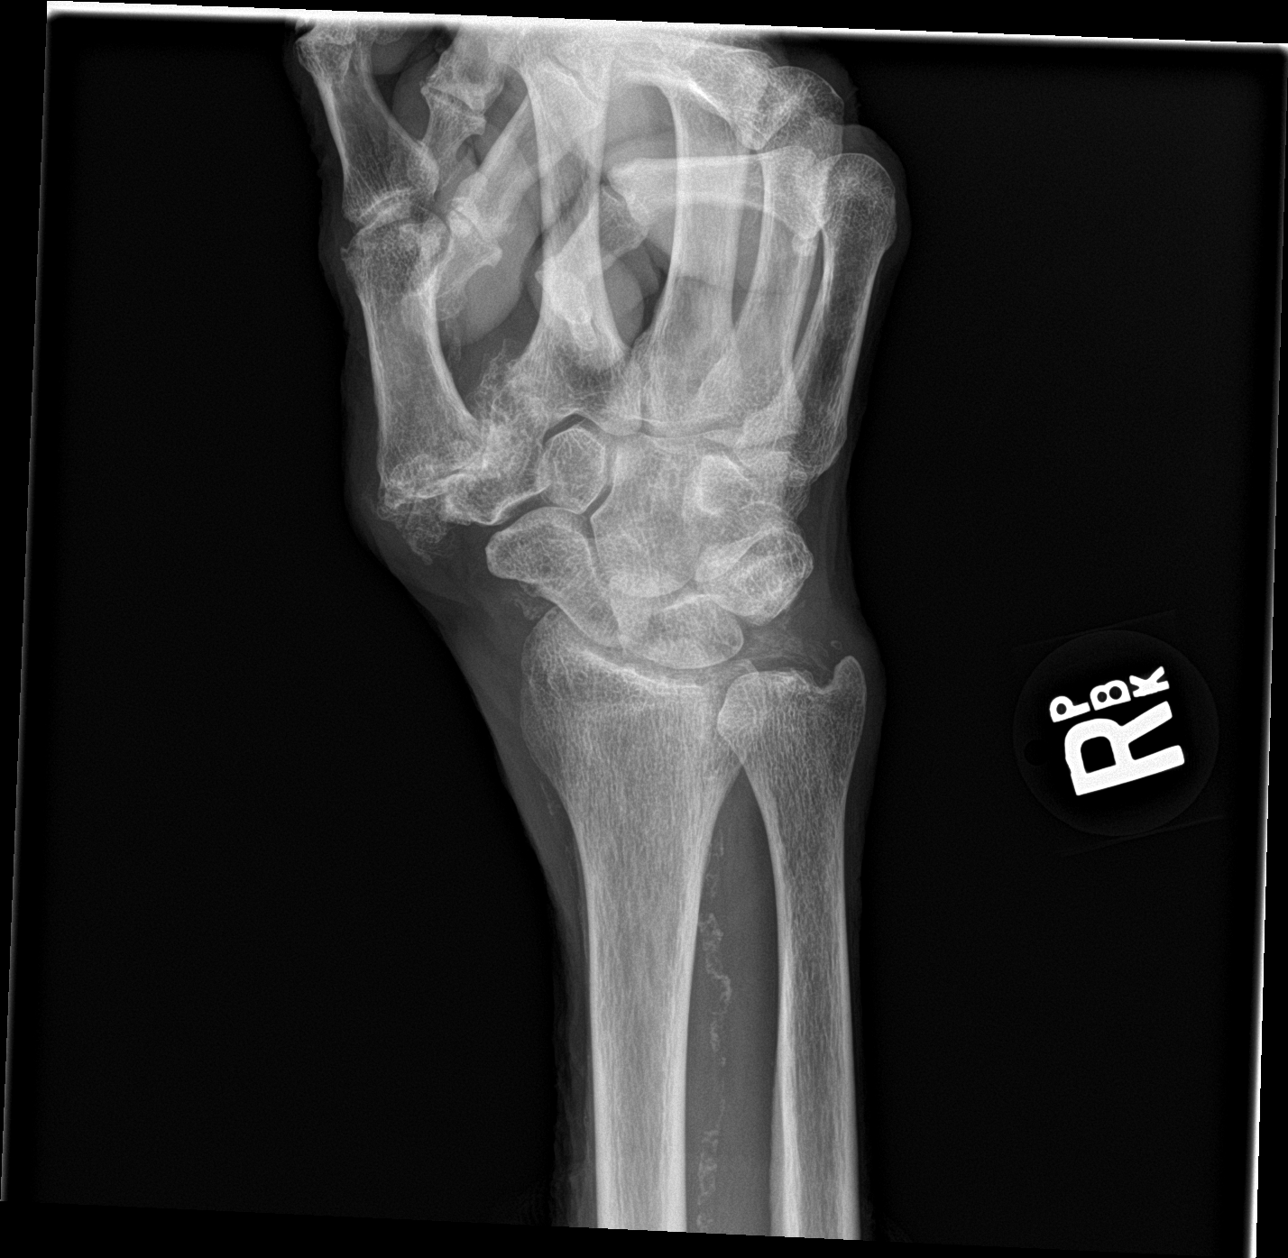

[wrist lat]
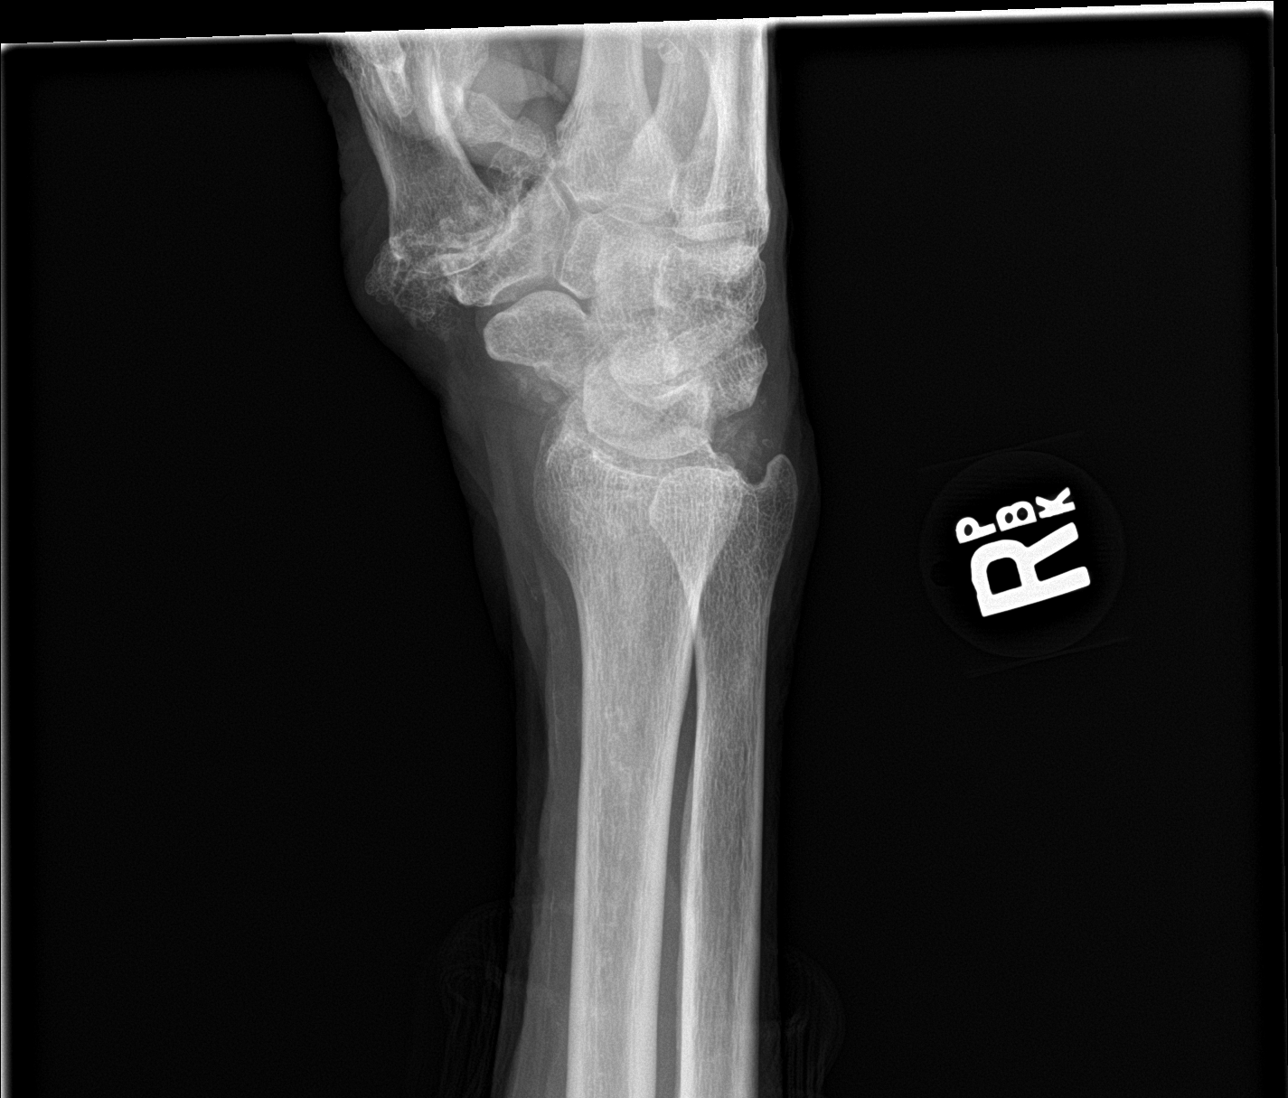

[wrist ap (2 of 2)]
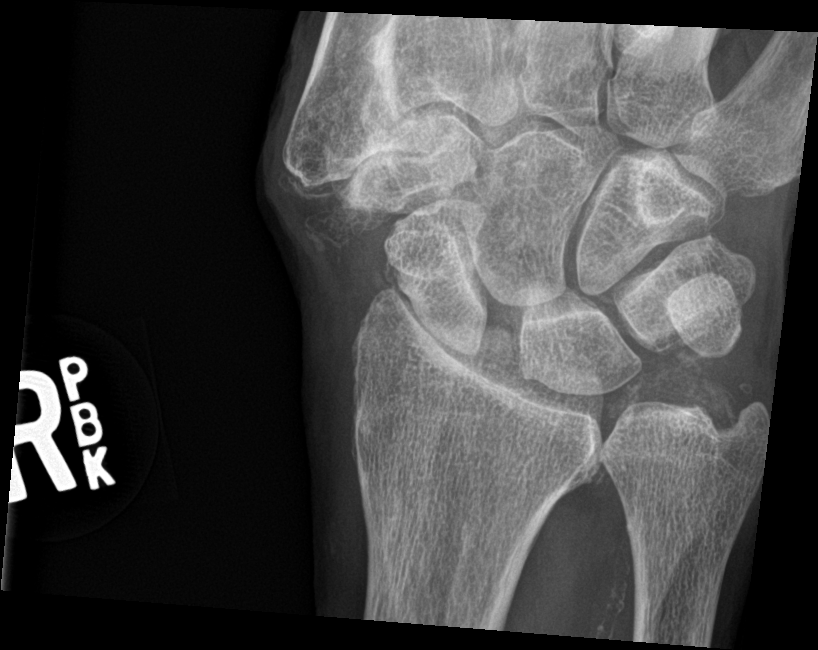

[4 of 4 positions shown; findings below may reference images not displayed]

FINDINGS: No fracture or dislocation. Chondrocalcinosis. Marked arthritis at
the first CMC joint. Degenerative changes at the radiocarpal joint.
Widened scaffold lunate interval measuring up to 5 mm. Vascular
calcification
IMPRESSION: 1. No definite acute osseous abnormality
2. Chondrocalcinosis.  Arthritis at the wrists.
3. Widened scaffold lunate interval consistent with ligamentous
abnormality.
